# Patient Record
Sex: Female | Born: 1965 | Race: Black or African American | Hispanic: No | Marital: Married | Smoking: Never smoker
Health system: Southern US, Community
[De-identification: ages and names within clinical notes are randomized; demographics above are authoritative.]

## PROBLEM LIST (undated history)

## (undated) DIAGNOSIS — R51 Headache: Secondary | ICD-10-CM

## (undated) DIAGNOSIS — N920 Excessive and frequent menstruation with regular cycle: Secondary | ICD-10-CM

## (undated) DIAGNOSIS — E785 Hyperlipidemia, unspecified: Secondary | ICD-10-CM

## (undated) DIAGNOSIS — D500 Iron deficiency anemia secondary to blood loss (chronic): Secondary | ICD-10-CM

## (undated) DIAGNOSIS — I10 Essential (primary) hypertension: Secondary | ICD-10-CM

## (undated) DIAGNOSIS — D219 Benign neoplasm of connective and other soft tissue, unspecified: Secondary | ICD-10-CM

## (undated) DIAGNOSIS — T7840XA Allergy, unspecified, initial encounter: Secondary | ICD-10-CM

## (undated) HISTORY — DX: Essential (primary) hypertension: I10

## (undated) HISTORY — PX: WISDOM TOOTH EXTRACTION: SHX21

## (undated) HISTORY — DX: Hyperlipidemia, unspecified: E78.5

## (undated) HISTORY — DX: Allergy, unspecified, initial encounter: T78.40XA

## (undated) HISTORY — DX: Iron deficiency anemia secondary to blood loss (chronic): D50.0

---

## 2011-02-04 ENCOUNTER — Ambulatory Visit: Payer: Self-pay

## 2011-02-04 DIAGNOSIS — J0100 Acute maxillary sinusitis, unspecified: Secondary | ICD-10-CM

## 2012-04-14 LAB — HM PAP SMEAR: HM Pap smear: NEGATIVE

## 2012-04-28 ENCOUNTER — Telehealth: Payer: Self-pay | Admitting: *Deleted

## 2012-04-28 ENCOUNTER — Other Ambulatory Visit: Payer: Self-pay | Admitting: Nurse Practitioner

## 2012-04-28 DIAGNOSIS — E559 Vitamin D deficiency, unspecified: Secondary | ICD-10-CM

## 2012-04-28 NOTE — Telephone Encounter (Signed)
Informed patient of lab results and starting Vitamin D per protocol

## 2012-04-29 ENCOUNTER — Ambulatory Visit (INDEPENDENT_AMBULATORY_CARE_PROVIDER_SITE_OTHER): Payer: BC Managed Care – PPO | Admitting: Obstetrics & Gynecology

## 2012-04-29 ENCOUNTER — Other Ambulatory Visit: Payer: Self-pay | Admitting: Obstetrics & Gynecology

## 2012-04-29 ENCOUNTER — Encounter: Payer: Self-pay | Admitting: Obstetrics & Gynecology

## 2012-04-29 ENCOUNTER — Other Ambulatory Visit: Payer: Self-pay | Admitting: *Deleted

## 2012-04-29 ENCOUNTER — Ambulatory Visit (INDEPENDENT_AMBULATORY_CARE_PROVIDER_SITE_OTHER): Payer: BC Managed Care – PPO

## 2012-04-29 ENCOUNTER — Ambulatory Visit: Payer: Self-pay

## 2012-04-29 DIAGNOSIS — N920 Excessive and frequent menstruation with regular cycle: Secondary | ICD-10-CM

## 2012-04-29 DIAGNOSIS — D500 Iron deficiency anemia secondary to blood loss (chronic): Secondary | ICD-10-CM | POA: Insufficient documentation

## 2012-04-29 DIAGNOSIS — D251 Intramural leiomyoma of uterus: Secondary | ICD-10-CM

## 2012-04-29 DIAGNOSIS — I10 Essential (primary) hypertension: Secondary | ICD-10-CM | POA: Insufficient documentation

## 2012-04-29 DIAGNOSIS — D259 Leiomyoma of uterus, unspecified: Secondary | ICD-10-CM

## 2012-04-29 DIAGNOSIS — D250 Submucous leiomyoma of uterus: Secondary | ICD-10-CM

## 2012-04-29 DIAGNOSIS — N840 Polyp of corpus uteri: Secondary | ICD-10-CM

## 2012-04-29 NOTE — Progress Notes (Addendum)
47 y.o. MarriedAfrican American female   G1P1 for discussion of pelvic ultrasound done for uterine leiomyomata/fibroids, menorrhagia and anemia.  A sonohysterogramwas included. An endometrial biopsy was done  Patient's last menstrual period was 04/23/2012.          Sexually active: yes , none.     Technique: Both transabdominal and transvaginal ultrasound  examinations of the pelvis were performed. Transabdominal technique  was performed for global imaging of the pelvis including uterus,  ovaries, adnexal regions, and pelvic cul-de-sac.  It was necessary to proceed with endovaginal exam following the abdominal ultrasound transabdominal exam to visualize the endometrium and adnexa.  Color and duplex Doppler ultrasound was utilized to evaluate blood  flow to the ovaries.   Transabdominal and transvaginal ultrasound findings: Uterus: 9.4 x 6.7 x 6.6cm with multiple intramural fibroids 1.6cm, 1.6cm, 2.0cm, 1.9cm, 1.4cm, 1.0cm Endometrium: difficult to fully evaluate due to presence of probable 2.1 x 1.2cm fibroid Adnexa: Left: 3.8 x 2.3 x 2.7cm             Right:  3.5 x 2.7 x 2.3 cm Cul de sac:  No free fluid   SHG:  Speculum placed.  Cervix cleansed with Betadine.  Tenaculum placed on anterior lip of cervix.  IUI catheter passed through cervical os and sterile saline instilled.  1.1cm endometrial polyp and 2.1cm submucosal fibroid noted.  Catheter removed.  Patient tolerated procedure well.  Endometrial biopsy:  Speculum placed again.  Cervix cleansed again with Betadine.  Tenaculum reapplied to anterior lip of cervix.  Endometrial pipelle passed to fundus of uterus, 8.5cm, with good tissue sample obtained with one passed.  All instruments removed.  Patient tolerated procedure well.  Discussed with patient results.  She and I reviewed all images.  We discussed treatment options including ocp's (patient not interested because of hypertension issues), oral progesterone, Depo-Provera, Mirena  IUD, resection of fibroid hysteroscopically, endometrial ablation and hysterectomy.  She is not interested in hysterectomy.  We discussed risks, benefits, recovery from surgery.  She will discuss with her husband and await pathology results.     Assessment: Menorrhagia, Uterine fibroids including submucosal fibroid, endometria polyp, anemia  Plan: Await pathology results.  Patient leaning towards hysteroscopic resection of fibroid  30 min spent with patient >50% of this time was in face to face counseling

## 2012-05-12 ENCOUNTER — Telehealth: Payer: Self-pay | Admitting: *Deleted

## 2012-05-12 NOTE — Telephone Encounter (Signed)
Calling patient to check on desired dates for surgery.

## 2012-05-22 ENCOUNTER — Telehealth: Payer: Self-pay | Admitting: *Deleted

## 2012-05-22 NOTE — Telephone Encounter (Signed)
Pre op appointment needed?

## 2012-05-22 NOTE — Telephone Encounter (Signed)
2nd call to patient leaving message that I am checking on date preferences for procedure. LMTCB

## 2012-05-22 NOTE — Telephone Encounter (Signed)
Spoke with patient regarding dates and scheduled Hysteroscopy/Fibroid resection  for 06-08-12 and preop instructions given and mailed to patient.

## 2012-05-23 NOTE — Telephone Encounter (Signed)
Yes.  Pre op visit please.

## 2012-05-25 NOTE — Telephone Encounter (Signed)
Call to patient and preop scheduled for 05-27-12.

## 2012-05-27 ENCOUNTER — Ambulatory Visit (INDEPENDENT_AMBULATORY_CARE_PROVIDER_SITE_OTHER): Payer: BC Managed Care – PPO | Admitting: Obstetrics & Gynecology

## 2012-05-27 VITALS — BP 128/82 | HR 76 | Resp 16 | Wt 227.0 lb

## 2012-05-27 DIAGNOSIS — N920 Excessive and frequent menstruation with regular cycle: Secondary | ICD-10-CM

## 2012-05-27 DIAGNOSIS — D259 Leiomyoma of uterus, unspecified: Secondary | ICD-10-CM

## 2012-05-27 DIAGNOSIS — D500 Iron deficiency anemia secondary to blood loss (chronic): Secondary | ICD-10-CM

## 2012-05-27 MED ORDER — HYDROCODONE-IBUPROFEN 5-200 MG PO TABS: 2.0000 | ORAL_TABLET | Freq: Four times a day (QID) | ORAL | Status: DC | PRN

## 2012-05-29 ENCOUNTER — Encounter: Payer: Self-pay | Admitting: Obstetrics & Gynecology

## 2012-05-29 ENCOUNTER — Encounter (HOSPITAL_COMMUNITY): Payer: Self-pay | Admitting: Obstetrics & Gynecology

## 2012-05-29 NOTE — Progress Notes (Signed)
47 y.o. MarriedAfrican American female her with spouse to discuss procedure options for treatment of menorrhagia and an anemia due to fibroids found on SHG at last visit.  They have discussed and she is interested in proceeding with hysteroscopic fibroid resection.  They have many questions.  Findings at time of ultrasound discussed with pt and spouse.  Procedure, hospital stay, recovery, risks all discussed.  Specifically <1% risk of transfusion, bleeding, infection, failure of procedure, and need to proceed with additional surgery like a hysterectomy, bowel/bladder/ureteral injury, DVT/PE, 1% uterine perforation risk with possible fluid loss and electrolyte imbalance leading to cardiac arrhythmia as well as cerebral edema all discussed.  She really wants to maintain her uterus if at all possible.    Ultrasound findings from prior visit: Uterus: 9.4 x 6.7 x 6.6cm with multiple intramural fibroids 1.6cm, 1.6cm, 2.0cm, 1.9cm, 1.4cm, 1.0cm Endometrium: difficult to fully evaluate due to presence of probable 2.1 x 1.2cm fibroid Adnexa: Left: 3.8 x 2.3 x 2.7cm             Right:  3.5 x 2.7 x 2.3 cm Cul de sac:  No free fluid  SHG:  1.1cm endometrial polyp and 2.1cm submucosal fibroid noted.   Endometrial biopsy results:  Fragments of benign endocervical cells.  (No endometrial tissue present.)  Presurgical instructions given.  No physical exam performed today.  Assessment: Menorrhagia, Uterine fibroids including submucosal fibroid, endometria polyp, anemia  Plan: Hysteroscopic resection of fibroid and possible polyp planned. RX for VIcoprofen 5/200 given for post-op pain management.  20 min spent with patient >50% of this time was in face to face counseling

## 2012-05-29 NOTE — Patient Instructions (Signed)
Our office will call if there are any changes to the time of your surgery.

## 2012-06-04 ENCOUNTER — Encounter (HOSPITAL_COMMUNITY): Payer: Self-pay | Admitting: Pharmacist

## 2012-06-08 ENCOUNTER — Encounter (HOSPITAL_COMMUNITY)
Admission: RE | Disposition: A | Discharge status: Home / Self Care | Payer: Self-pay | Source: Ambulatory Visit | Attending: Obstetrics & Gynecology

## 2012-06-08 ENCOUNTER — Ambulatory Visit (HOSPITAL_COMMUNITY)
Admission: RE | Admit: 2012-06-08 | Discharge: 2012-06-08 | Disposition: A | Discharge status: Home / Self Care | Payer: BC Managed Care – PPO | Source: Ambulatory Visit | Attending: Obstetrics & Gynecology | Admitting: Obstetrics & Gynecology

## 2012-06-08 ENCOUNTER — Encounter (HOSPITAL_COMMUNITY): Payer: Self-pay | Admitting: *Deleted

## 2012-06-08 ENCOUNTER — Ambulatory Visit (HOSPITAL_COMMUNITY): Payer: BC Managed Care – PPO | Admitting: Anesthesiology

## 2012-06-08 ENCOUNTER — Encounter (HOSPITAL_COMMUNITY): Payer: Self-pay | Admitting: Anesthesiology

## 2012-06-08 DIAGNOSIS — N920 Excessive and frequent menstruation with regular cycle: Secondary | ICD-10-CM

## 2012-06-08 DIAGNOSIS — D250 Submucous leiomyoma of uterus: Secondary | ICD-10-CM | POA: Diagnosis present

## 2012-06-08 DIAGNOSIS — D500 Iron deficiency anemia secondary to blood loss (chronic): Secondary | ICD-10-CM | POA: Insufficient documentation

## 2012-06-08 DIAGNOSIS — N840 Polyp of corpus uteri: Secondary | ICD-10-CM | POA: Insufficient documentation

## 2012-06-08 DIAGNOSIS — I10 Essential (primary) hypertension: Secondary | ICD-10-CM | POA: Insufficient documentation

## 2012-06-08 DIAGNOSIS — D251 Intramural leiomyoma of uterus: Secondary | ICD-10-CM | POA: Insufficient documentation

## 2012-06-08 HISTORY — DX: Benign neoplasm of connective and other soft tissue, unspecified: D21.9

## 2012-06-08 HISTORY — DX: Excessive and frequent menstruation with regular cycle: N92.0

## 2012-06-08 HISTORY — PX: DILITATION & CURRETTAGE/HYSTROSCOPY WITH VERSAPOINT RESECTION: SHX5571

## 2012-06-08 LAB — CBC
Hemoglobin: 10.1 g/dL — ABNORMAL LOW (ref 12.0–15.0)
Platelets: 299 10*3/uL (ref 150–400)
RBC: 4.1 MIL/uL (ref 3.87–5.11)
WBC: 5.3 10*3/uL (ref 4.0–10.5)

## 2012-06-08 LAB — HCG, SERUM, QUALITATIVE: Preg, Serum: NEGATIVE

## 2012-06-08 SURGERY — DILATATION & CURETTAGE/HYSTEROSCOPY WITH VERSAPOINT RESECTION
Anesthesia: General | Site: Vagina | Wound class: Clean Contaminated

## 2012-06-08 MED ORDER — MIDAZOLAM HCL 2 MG/2ML IJ SOLN: 0.5000 mg | Freq: Once | INTRAMUSCULAR | Status: DC | PRN

## 2012-06-08 MED ORDER — FENTANYL CITRATE 0.05 MG/ML IJ SOLN
INTRAMUSCULAR | Status: AC
  Administered 2012-06-08: 50 ug via INTRAVENOUS
  Filled 2012-06-08: qty 2

## 2012-06-08 MED ORDER — LIDOCAINE HCL (CARDIAC) 20 MG/ML IV SOLN
INTRAVENOUS | Status: AC
  Filled 2012-06-08: qty 5

## 2012-06-08 MED ORDER — GLYCOPYRROLATE 0.2 MG/ML IJ SOLN
INTRAMUSCULAR | Status: DC | PRN
  Administered 2012-06-08: 0.2 mg via INTRAVENOUS

## 2012-06-08 MED ORDER — FENTANYL CITRATE 0.05 MG/ML IJ SOLN
INTRAMUSCULAR | Status: AC
  Filled 2012-06-08: qty 2

## 2012-06-08 MED ORDER — MEPERIDINE HCL 25 MG/ML IJ SOLN: 6.2500 mg | INTRAMUSCULAR | Status: DC | PRN

## 2012-06-08 MED ORDER — KETOROLAC TROMETHAMINE 30 MG/ML IJ SOLN
INTRAMUSCULAR | Status: AC
  Filled 2012-06-08: qty 1

## 2012-06-08 MED ORDER — MIDAZOLAM HCL 2 MG/2ML IJ SOLN
INTRAMUSCULAR | Status: AC
  Filled 2012-06-08: qty 2

## 2012-06-08 MED ORDER — CEFAZOLIN SODIUM-DEXTROSE 2-3 GM-% IV SOLR
INTRAVENOUS | Status: AC
  Filled 2012-06-08: qty 50

## 2012-06-08 MED ORDER — KETOROLAC TROMETHAMINE 30 MG/ML IJ SOLN
INTRAMUSCULAR | Status: DC | PRN
  Administered 2012-06-08: 30 mg via INTRAVENOUS

## 2012-06-08 MED ORDER — SILVER NITRATE-POT NITRATE 75-25 % EX MISC
CUTANEOUS | Status: DC | PRN
  Administered 2012-06-08: 10

## 2012-06-08 MED ORDER — SILVER NITRATE-POT NITRATE 75-25 % EX MISC
CUTANEOUS | Status: AC
  Filled 2012-06-08: qty 1

## 2012-06-08 MED ORDER — FENTANYL CITRATE 0.05 MG/ML IJ SOLN
INTRAMUSCULAR | Status: DC | PRN
  Administered 2012-06-08 (×2): 50 ug via INTRAVENOUS

## 2012-06-08 MED ORDER — CEFAZOLIN SODIUM-DEXTROSE 2-3 GM-% IV SOLR
2.0000 g | INTRAVENOUS | Status: AC
  Administered 2012-06-08: 2 g via INTRAVENOUS

## 2012-06-08 MED ORDER — ONDANSETRON HCL 4 MG/2ML IJ SOLN
INTRAMUSCULAR | Status: AC
  Filled 2012-06-08: qty 2

## 2012-06-08 MED ORDER — MIDAZOLAM HCL 5 MG/5ML IJ SOLN
INTRAMUSCULAR | Status: DC | PRN
  Administered 2012-06-08: 2 mg via INTRAVENOUS

## 2012-06-08 MED ORDER — HYDROCODONE-ACETAMINOPHEN 5-325 MG PO TABS: 1.0000 | ORAL_TABLET | Freq: Four times a day (QID) | ORAL | Status: DC | PRN

## 2012-06-08 MED ORDER — PROPOFOL 10 MG/ML IV EMUL
INTRAVENOUS | Status: DC | PRN
  Administered 2012-06-08: 200 mg via INTRAVENOUS

## 2012-06-08 MED ORDER — FENTANYL CITRATE 0.05 MG/ML IJ SOLN: 25.0000 ug | INTRAMUSCULAR | Status: DC | PRN

## 2012-06-08 MED ORDER — KETOROLAC TROMETHAMINE 30 MG/ML IJ SOLN: 15.0000 mg | Freq: Once | INTRAMUSCULAR | Status: DC | PRN

## 2012-06-08 MED ORDER — ALBUTEROL SULFATE HFA 108 (90 BASE) MCG/ACT IN AERS
INHALATION_SPRAY | RESPIRATORY_TRACT | Status: AC
  Filled 2012-06-08: qty 6.7

## 2012-06-08 MED ORDER — PROMETHAZINE HCL 25 MG/ML IJ SOLN: 6.2500 mg | INTRAMUSCULAR | Status: DC | PRN

## 2012-06-08 MED ORDER — SODIUM CHLORIDE 0.9 % IR SOLN
Status: DC | PRN
  Administered 2012-06-08 (×2): 3000 mL

## 2012-06-08 MED ORDER — VASOPRESSIN 20 UNIT/ML IJ SOLN
INTRAVENOUS | Status: DC | PRN
  Administered 2012-06-08: 10:00:00 via INTRAMUSCULAR

## 2012-06-08 MED ORDER — LIDOCAINE HCL (CARDIAC) 20 MG/ML IV SOLN
INTRAVENOUS | Status: DC | PRN
  Administered 2012-06-08: 50 mg via INTRAVENOUS

## 2012-06-08 MED ORDER — ONDANSETRON HCL 4 MG/2ML IJ SOLN
INTRAMUSCULAR | Status: DC | PRN
  Administered 2012-06-08: 4 mg via INTRAVENOUS

## 2012-06-08 MED ORDER — VASOPRESSIN 20 UNIT/ML IJ SOLN
INTRAMUSCULAR | Status: AC
  Filled 2012-06-08: qty 1

## 2012-06-08 MED ORDER — HYDROCODONE-IBUPROFEN 5-200 MG PO TABS: 2.0000 | ORAL_TABLET | Freq: Four times a day (QID) | ORAL | Status: DC | PRN

## 2012-06-08 MED ORDER — PROPOFOL 10 MG/ML IV EMUL
INTRAVENOUS | Status: AC
  Filled 2012-06-08: qty 20

## 2012-06-08 MED ORDER — LACTATED RINGERS IV SOLN
INTRAVENOUS | Status: DC
  Administered 2012-06-08 (×2): via INTRAVENOUS

## 2012-06-08 SURGICAL SUPPLY — 20 items
CANISTER SUCTION 2500CC (MISCELLANEOUS) ×2 IMPLANT
CATH ROBINSON RED A/P 16FR (CATHETERS) ×2 IMPLANT
CLOTH BEACON ORANGE TIMEOUT ST (SAFETY) ×2 IMPLANT
CONTAINER PREFILL 10% NBF 60ML (FORM) ×4 IMPLANT
DILATOR CANAL MILEX (MISCELLANEOUS) IMPLANT
DRESSING TELFA 8X3 (GAUZE/BANDAGES/DRESSINGS) ×2 IMPLANT
ELECT REM PT RETURN 9FT ADLT (ELECTROSURGICAL)
ELECTRODE REM PT RTRN 9FT ADLT (ELECTROSURGICAL) IMPLANT
ELECTRODE ROLLER VERSAPOINT (ELECTRODE) IMPLANT
ELECTRODE RT ANGLE VERSAPOINT (CUTTING LOOP) ×2 IMPLANT
GLOVE BIOGEL PI IND STRL 7.0 (GLOVE) ×1 IMPLANT
GLOVE BIOGEL PI INDICATOR 7.0 (GLOVE) ×1
GLOVE ECLIPSE 6.5 STRL STRAW (GLOVE) ×4 IMPLANT
GOWN STRL REIN XL XLG (GOWN DISPOSABLE) ×4 IMPLANT
LOOP ANGLED CUTTING 22FR (CUTTING LOOP) IMPLANT
PACK HYSTEROSCOPY LF (CUSTOM PROCEDURE TRAY) ×2 IMPLANT
PAD OB MATERNITY 4.3X12.25 (PERSONAL CARE ITEMS) ×2 IMPLANT
SOLUTION ANTI FOG 6CC (MISCELLANEOUS) ×2 IMPLANT
TOWEL OR 17X24 6PK STRL BLUE (TOWEL DISPOSABLE) ×4 IMPLANT
WATER STERILE IRR 1000ML POUR (IV SOLUTION) ×2 IMPLANT

## 2012-06-08 NOTE — Transfer of Care (Signed)
Immediate Anesthesia Transfer of Care Note  Patient: Belinda Petersen  Procedure(s) Performed: Procedure(s): DILATATION & CURETTAGE/HYSTEROSCOPY WITH VERSAPOINT RESECTION (N/A)  Patient Location: PACU  Anesthesia Type:General  Level of Consciousness: awake, alert  and oriented  Airway & Oxygen Therapy: Patient Spontanous Breathing and Patient connected to nasal cannula oxygen  Post-op Assessment: Report given to PACU RN and Post -op Vital signs reviewed and stable  Post vital signs: Reviewed and stable  Complications: No apparent anesthesia complications

## 2012-06-08 NOTE — H&P (Addendum)
Belinda Petersen is an 47 y.o. female with hx of menorrhagia and an anemia due to fibroids found on SHG.  [facility] showed 2.1cm submucosal fibroid and endometrial polyp.  Endometrial biopsy had no intact endometrial tissue.  She is here for hysteroscopic resection of fibroid and polyp.  Risks and benefits discussed at last visit. All questions answered.  No new issues since last visit.   Ultrasound findings from prior visit:  Uterus: 9.4 x 6.7 x 6.6cm with multiple intramural fibroids 1.6cm, 1.6cm, 2.0cm, 1.9cm, 1.4cm, 1.0cm  Endometrium: difficult to fully evaluate due to presence of probable 2.1 x 1.2cm fibroid  Adnexa: Left: 3.8 x 2.3 x 2.7cm  Right: 3.5 x 2.7 x 2.3 cm  Cul de sac: No free fluid  SHG: 1.1cm endometrial polyp and 2.1cm submucosal fibroid noted.  Endometrial biopsy results: Fragments of benign endocervical cells. (No endometrial tissue present.)    Pertinent Gynecological History: Menses: menorrhagia Bleeding: heavy Contraception: condoms DES exposure: denies Blood transfusions: none Sexually transmitted diseases: no past history Previous GYN Procedures: none  Last mammogram: normal Date: 2012 Last pap: normal Date: 2014 OB History: G1, P1   Menstrual History: No LMP recorded.  Per patient, LMP was 05/22/12    Past Medical History  Diagnosis Date  . Iron deficiency anemia secondary to blood loss (chronic)   . Menorrhagia   . Fibroids   . SVD (spontaneous vaginal delivery)     x 1  . HTN (hypertension)     no meds    Past Surgical History  Procedure Laterality Date  . Wisdom tooth extraction      Family History  Problem Relation Age of Onset  . Diabetes Father   . Hypertension Mother     deceased  . Hypertension Brother   . Hypertension Brother   . Hypertension Brother   . Hypertension Sister   . Heart disease Mother     deceased  . Heart disease Brother     Social History:  reports that she has never smoked. She has never used smokeless tobacco.  She reports that  drinks alcohol. She reports that she does not use illicit drugs.  Allergies: No Known Allergies  Prescriptions prior to admission  Medication Sig Dispense Refill  . ferrous fumarate (HEMOCYTE - 106 MG FE) 325 (106 FE) MG TABS Take 1 tablet by mouth daily.      . hydrocodone-ibuprofen (VICOPROFEN) 5-200 MG per tablet Take 2 tablets by mouth every 6 (six) hours as needed for pain.  20 tablet  0    Review of Systems  Constitutional: Negative for fever and chills.  Respiratory: Negative for cough.   Cardiovascular: Negative for chest pain and palpitations.  Gastrointestinal: Negative for heartburn and nausea.  Genitourinary: Negative for dysuria.  Musculoskeletal: Negative for myalgias.  Neurological: Negative for dizziness, tingling and headaches.  Psychiatric/Behavioral: Negative for depression.    Blood pressure 152/102, pulse 79, temperature 98.2 F (36.8 C), temperature source Oral, resp. rate 18, height 5\' 3"  (1.6 m), weight 227 lb (102.967 kg), SpO2 100.00%. Physical Exam  Constitutional: She is oriented to person, place, and time. She appears well-developed and well-nourished.  HENT:  Head: Normocephalic and atraumatic.  Neck: Normal range of motion. Neck supple.  Cardiovascular: Normal rate and regular rhythm.   Respiratory: Effort normal and breath sounds normal.  GI: Soft. Bowel sounds are normal.  Neurological: She is alert and oriented to person, place, and time.  Skin: Skin is warm and dry.    Results  for orders placed during the hospital encounter of 06/08/12 (from the past 24 hour(s))  HCG, SERUM, QUALITATIVE     Status: None   Collection Time    06/08/12  8:25 AM      Result Value Range   Preg, Serum NEGATIVE  NEGATIVE  CBC     Status: Abnormal   Collection Time    06/08/12  8:25 AM      Result Value Range   WBC 5.3  4.0 - 10.5 K/uL   RBC 4.10  3.87 - 5.11 MIL/uL   Hemoglobin 10.1 (*) 12.0 - 15.0 g/dL   HCT 16.1 (*) 09.6 - 04.5 %   MCV  76.3 (*) 78.0 - 100.0 fL   MCH 24.6 (*) 26.0 - 34.0 pg   MCHC 32.3  30.0 - 36.0 g/dL   RDW 40.9 (*) 81.1 - 91.4 %   Platelets 299  150 - 400 K/uL    No results found.  Assessment/Plan: 47 year old with menorrhagia, submucosal fibroid and polyp here for hysteroscopic resection.  All questions answered.  Patient here and ready to proceed.  Valentina Shaggy [facility] 06/08/2012, 9:16 AM

## 2012-06-08 NOTE — Anesthesia Preprocedure Evaluation (Signed)
Anesthesia Evaluation  Patient identified by MRN, date of birth, ID band Patient awake    Reviewed: Allergy & Precautions, H&P , Patient's Chart, lab work & pertinent test results, reviewed documented beta blocker date and time   History of Anesthesia Complications Negative for: history of anesthetic complications  Airway Mallampati: III TM Distance: >3 FB Neck ROM: full    Dental no notable dental hx.    Pulmonary neg pulmonary ROS,  breath sounds clear to auscultation  Pulmonary exam normal       Cardiovascular Exercise Tolerance: Good hypertension, negative cardio ROS  Rhythm:regular Rate:Normal     Neuro/Psych negative neurological ROS  negative psych ROS   GI/Hepatic negative GI ROS, Neg liver ROS,   Endo/Other  negative endocrine ROSMorbid obesity  Renal/GU negative Renal ROS     Musculoskeletal   Abdominal   Peds  Hematology negative hematology ROS (+) anemia ,   Anesthesia Other Findings Iron deficiency anemia secondary to blood loss (chronic)     Menorrhagia        Fibroids     SVD (spontaneous vaginal delivery)   x 1    HTN (hypertension)   no meds    Reproductive/Obstetrics negative OB ROS                           Anesthesia Physical Anesthesia Plan  ASA: III  Anesthesia Plan: General LMA   Post-op Pain Management:    Induction:   Airway Management Planned:   Additional Equipment:   Intra-op Plan:   Post-operative Plan:   Informed Consent: I have reviewed the patients History and Physical, chart, labs and discussed the procedure including the risks, benefits and alternatives for the proposed anesthesia with the patient or authorized representative who has indicated his/her understanding and acceptance.   Dental Advisory Given  Plan Discussed with: CRNA, Surgeon and Anesthesiologist  Anesthesia Plan Comments:         Anesthesia Quick Evaluation

## 2012-06-08 NOTE — Anesthesia Postprocedure Evaluation (Signed)
  Anesthesia Post Note  Patient: Belinda Petersen  Procedure(s) Performed: Procedure(s) (LRB): DILATATION & CURETTAGE/HYSTEROSCOPY WITH VERSAPOINT RESECTION (N/A)  Anesthesia type: GA  Patient location: PACU  Post pain: Pain level controlled  Post assessment: Post-op Vital signs reviewed  Last Vitals:  Filed Vitals:   06/08/12 1130  BP: 142/91  Pulse: 82  Temp:   Resp: 15    Post vital signs: Reviewed  Level of consciousness: sedated  Complications: No apparent anesthesia complications

## 2012-06-08 NOTE — Op Note (Signed)
06/08/2012  11:24 AM  PATIENT:  Belinda Petersen  47 y.o. female G1P1 with menorrhagia, 2.1cm submucosal fibroid, other intramural fibroids <2.0 cm, anemia  PRE-OPERATIVE DIAGNOSIS:  menorrhagia, fibroid anemia  POST-OPERATIVE DIAGNOSIS:  menorrhagia, fibroid anemia  PROCEDURE:  Procedure(s): DILATATION & CURETTAGE/HYSTEROSCOPY WITH VERSAPOINT RESECTION OF SUBMUCOSAL FIBROID  SURGEON:  Victoriano Campion SUZANNE  ASSISTANTS: OR STAFF   ANESTHESIA:   general  ESTIMATED BLOOD LOSS: 50cc  BLOOD ADMINISTERED:none   FLUIDS: 1600cc LR  UOP: 75 cc drained with I&O cath at beginning of surgery  SPECIMEN:  Submucosal fibroid and possible polyp   DISPOSITION OF SPECIMEN:  PATHOLOGY  FINDINGS: Fluffy endometrium with possible polyp as well as submucosal fibroid filling substantial portion of endometrial cavity.  Posterior attechment to uterine wall.  DESCRIPTION OF OPERATION: Patient was taken to the operating room. She is placed in the supine position. General anesthesia was administered by the anesthesia staff without difficulty. She did have a running IV in place. SCDs were on lower legs and functioning properly.  Informed consent was present on the chart.  Legs are placed in the [facility] stirrups in the low lithotomy position. Perineum inner thighs and vagina were prepped with Betadine x3 and she's draped in a normal standard fashion. A red rubber Foley catheter is used to drain the bladder of all urine. A bivalve speculums placed the vagina. The anterior lip of the cervix was grasped with a single-tooth tenaculum. A paracervical placement of Pitressin (20 units in 100 cc fluid) was used. Then the cc total was injected around the cervix at the cervicovaginal junction. The cervix was then dilated with Shawnie Pons dilators up to #29. An attempt was made to pass the operative hysteroscope but this was unsuccessful. Therefore the cervix is dilated up to #31. Then the hysteroscope with the angled VersaPoint  resection loop attached was passed through the cervical os and into the endometrial cavity. The fundus was visualized as well as the tubal ostia on the left. The fibroid obscured seeing the tubal ostia on the right. The fibroid and a posterior attachment until the special portion of the endometrial cavity. With the VersaPoint on normal settings the fibroid was fully resected by extending the resection loop and bringing it back towards hysteroscope starting at the superficial portion of the submucosal fibroid and continuing until fibroid appeared fully resected and the posterior wall of the uterus was flat in appearance. Hysteroscope was removed approximately 3 times during the procedure to try and remove fibroid resection pieces for better visualization.   Photodocumentation was before and after resection was completed.  When the resection was complete the fluid was allowed to slowly and the from the endometrial cavity so I could watch for any bleeders.  Number present. The hysteroscope was fully removed.  Normal saline was used as a hysteroscopic fluid for this procedure. The deficit was 540 cc. This included fluid on the floor which soaked two blue towels as well as fluid that had soaked the towels used as part the drape.  Patient tolerated procedure very well. The Betadine prep was cleansed of her skin. Legs are placed back in the supine position. 30 mg IV of Toradol was given. Sponge, lap, needle, initially counts were correct x2. Patient was awakened from anesthesia and taken to recovery stable condition.   COUNTS:  YES  PLAN OF CARE: Transfer to PACU

## 2012-06-08 NOTE — Progress Notes (Signed)
Notified Dr Brayton Caves of patient's BP 152/102 this morning in Short Stay.  No orders given.

## 2012-06-09 ENCOUNTER — Encounter (HOSPITAL_COMMUNITY): Payer: Self-pay | Admitting: Obstetrics & Gynecology

## 2012-06-15 ENCOUNTER — Telehealth: Payer: Self-pay | Admitting: Obstetrics & Gynecology

## 2012-06-15 NOTE — Telephone Encounter (Signed)
Patient left message on answering machine 06/13/12 at 1:49 AM re: had procedure last Monday and reports heavy bleeding and pain. Please advise.

## 2012-06-15 NOTE — Telephone Encounter (Signed)
LMTCB  aa 

## 2012-06-15 NOTE — Telephone Encounter (Signed)
Called patient home and cell # left message of need to call office for appt.  Called patient spouse cell # and left message of need to call office.

## 2012-06-16 ENCOUNTER — Telehealth: Payer: Self-pay | Admitting: *Deleted

## 2012-06-16 NOTE — Telephone Encounter (Signed)
May chart be filed back. See phone note for 06/15/2012. ? sue

## 2012-06-16 NOTE — Telephone Encounter (Signed)
Yes.  Close encounter.

## 2012-06-24 ENCOUNTER — Telehealth: Payer: Self-pay | Admitting: *Deleted

## 2012-06-24 NOTE — Telephone Encounter (Signed)
Patient called at 330 to report that she had bright red vaginal bleeding this am that covered (but not soaked) a minipad.  She reports she had intercourse on 06-22-12 for the first time since procedure.  Bleeding is now mostly with wiping but is still bright red and has some mild cramps.  After reviewed with Dr Hyacinth Meeker, called patient back at 4pm to let her know that Dr Hyacinth Meeker would like her to take it easy, monitor, observe, may take Motrin for cramps and call for heavy bleeding or increased pain.  Patient agreeable.

## 2012-07-02 ENCOUNTER — Encounter: Payer: Self-pay | Admitting: Obstetrics & Gynecology

## 2012-07-02 ENCOUNTER — Ambulatory Visit (INDEPENDENT_AMBULATORY_CARE_PROVIDER_SITE_OTHER): Payer: BC Managed Care – PPO | Admitting: Obstetrics & Gynecology

## 2012-07-02 VITALS — BP 160/100 | HR 80 | Resp 16

## 2012-07-02 DIAGNOSIS — N920 Excessive and frequent menstruation with regular cycle: Secondary | ICD-10-CM

## 2012-07-02 NOTE — Patient Instructions (Signed)
Return in two months for CBC, iron studies.

## 2012-07-02 NOTE — Progress Notes (Signed)
47 y.o. G1P1 MarriedAfrican AmericanF here for post op visit after hystroscopic fibtroid resection.  Has had bleeding with intercourse once since surgery.  Husband is worried about this.  Patient had bleeding for several days after procedure.  Feeling well.  No fever.  No bowel/bladder changes.  No vaginal discharge.  Patient's last menstrual period was 05/22/2012.           Exam:   BP 160/100  Pulse 80  Resp 16  LMP 05/22/2012  General appearance: alert, cooperative and appears stated age  Pelvic: External genitalia:  no lesions              Urethra:  normal appearing urethra with no masses, tenderness or lesions              Bartholins and Skenes: normal                 Vagina: normal appearing vagina with normal color and discharge, no lesions              Cervix: no lesions and closed Bimanual Exam:  Uterus:  enlarged, 10-12 weeks size, globular, nontender       Hb today 12.1       A:  S/p hysteroscopic fibroid resection Needs to f/u with PCP about BP  P:   Return for CBC, iron studies two months.   An After Visit Summary was printed and given to the patient.

## 2012-07-03 ENCOUNTER — Telehealth: Payer: Self-pay

## 2012-07-03 LAB — HEMOGLOBIN, FINGERSTICK: Hemoglobin, fingerstick: 12.1 g/dL (ref 12.0–16.0)

## 2012-07-03 NOTE — Telephone Encounter (Signed)
5/23 lmtcb-need to inform patient should f/u with PCP re: elevated BP yesterday.

## 2012-07-10 NOTE — Telephone Encounter (Signed)
5/30 lmtcb/kn 

## 2012-07-13 NOTE — Telephone Encounter (Signed)
Spoke with patient. Advised needs to follow up with PCP re: elevated blood pressure. Is in the process of looking for one, will discuss when she finds one.

## 2012-07-14 ENCOUNTER — Telehealth: Payer: Self-pay | Admitting: *Deleted

## 2012-07-29 ENCOUNTER — Encounter: Payer: Self-pay | Admitting: Obstetrics & Gynecology

## 2012-07-29 ENCOUNTER — Other Ambulatory Visit: Payer: Self-pay

## 2012-08-25 ENCOUNTER — Other Ambulatory Visit: Payer: BC Managed Care – PPO

## 2012-08-31 ENCOUNTER — Other Ambulatory Visit: Payer: BC Managed Care – PPO

## 2012-09-01 ENCOUNTER — Ambulatory Visit (INDEPENDENT_AMBULATORY_CARE_PROVIDER_SITE_OTHER): Payer: BC Managed Care – PPO | Admitting: Obstetrics & Gynecology

## 2012-09-01 DIAGNOSIS — N920 Excessive and frequent menstruation with regular cycle: Secondary | ICD-10-CM

## 2012-09-01 DIAGNOSIS — E559 Vitamin D deficiency, unspecified: Secondary | ICD-10-CM

## 2012-09-01 LAB — CBC
HCT: 31.6 % — ABNORMAL LOW (ref 36.0–46.0)
Hemoglobin: 10.3 g/dL — ABNORMAL LOW (ref 12.0–15.0)
MCHC: 32.6 g/dL (ref 30.0–36.0)
RDW: 16.8 % — ABNORMAL HIGH (ref 11.5–15.5)
WBC: 4.9 10*3/uL (ref 4.0–10.5)

## 2012-09-01 LAB — FERRITIN: Ferritin: 16 ng/mL (ref 10–291)

## 2012-09-01 LAB — IRON: Iron: 42 ug/dL (ref 42–145)

## 2012-09-02 ENCOUNTER — Telehealth: Payer: Self-pay | Admitting: *Deleted

## 2012-09-02 LAB — VITAMIN D 25 HYDROXY (VIT D DEFICIENCY, FRACTURES): Vit D, 25-Hydroxy: 28 ng/mL — ABNORMAL LOW (ref 30–89)

## 2012-09-02 NOTE — Telephone Encounter (Signed)
Message copied by Regis Lax on Wed Sep 02, 2012 11:28 AM ------      Message from: Conley Simmonds      Created: Wed Sep 02, 2012  8:29 AM       Please contact patient regarding her low vitamin D and start the protocol with her.            Her lab studies indicate anemia and low normal iron levels.  Does she have this information at this time?      If she is not on any iron.  Please have her start FeS04 325 mg po bid with meals.  She may need Colace to soften her stools.      She will need a lab recheck in 1 - 2 months.      If she is having heavy or irregular bleeding, follow up with Dr. Hyacinth Meeker. ------

## 2012-09-02 NOTE — Telephone Encounter (Signed)
Left Message To Call Back  

## 2012-09-07 NOTE — Telephone Encounter (Signed)
Left Message To Call Back  

## 2012-09-10 ENCOUNTER — Encounter: Payer: Self-pay | Admitting: *Deleted

## 2012-09-10 MED ORDER — FERROUS FUMARATE 325 (106 FE) MG PO TABS: 1.0000 | ORAL_TABLET | Freq: Every day | ORAL | Status: DC

## 2012-09-10 NOTE — Telephone Encounter (Signed)
Left Message To Call Back  

## 2012-09-10 NOTE — Telephone Encounter (Signed)
Returning call.

## 2012-09-10 NOTE — Telephone Encounter (Signed)
Patient notified see labs 

## 2012-09-22 ENCOUNTER — Ambulatory Visit (INDEPENDENT_AMBULATORY_CARE_PROVIDER_SITE_OTHER): Payer: BC Managed Care – PPO | Admitting: Family Medicine

## 2012-09-22 VITALS — BP 178/100 | HR 79 | Temp 98.1°F | Resp 18 | Ht 64.0 in | Wt 228.0 lb

## 2012-09-22 DIAGNOSIS — R42 Dizziness and giddiness: Secondary | ICD-10-CM

## 2012-09-22 DIAGNOSIS — I10 Essential (primary) hypertension: Secondary | ICD-10-CM

## 2012-09-22 DIAGNOSIS — D649 Anemia, unspecified: Secondary | ICD-10-CM

## 2012-09-22 DIAGNOSIS — R51 Headache: Secondary | ICD-10-CM

## 2012-09-22 LAB — COMPREHENSIVE METABOLIC PANEL WITH GFR
ALT: 13 U/L (ref 0–35)
AST: 16 U/L (ref 0–37)
Alkaline Phosphatase: 72 U/L (ref 39–117)
CO2: 31 meq/L (ref 19–32)
Sodium: 137 meq/L (ref 135–145)
Total Bilirubin: 0.3 mg/dL (ref 0.3–1.2)
Total Protein: 7.5 g/dL (ref 6.0–8.3)

## 2012-09-22 LAB — COMPREHENSIVE METABOLIC PANEL
Albumin: 3.7 g/dL (ref 3.5–5.2)
BUN: 12 mg/dL (ref 6–23)
Calcium: 9.3 mg/dL (ref 8.4–10.5)
Chloride: 102 mEq/L (ref 96–112)
Creat: 0.84 mg/dL (ref 0.50–1.10)
Glucose, Bld: 75 mg/dL (ref 70–99)
Potassium: 4.3 mEq/L (ref 3.5–5.3)

## 2012-09-22 LAB — POCT CBC
Granulocyte percent: 52.5 %G (ref 37–80)
HCT, POC: 32.3 % — AB (ref 37.7–47.9)
Hemoglobin: 9.9 g/dL — AB (ref 12.2–16.2)
Lymph, poc: 1.7 (ref 0.6–3.4)
MCH, POC: 25.4 pg — AB (ref 27–31.2)
MCHC: 30.7 g/dL — AB (ref 31.8–35.4)
MCV: 82.8 fL (ref 80–97)
MID (cbc): 0.4 (ref 0–0.9)
MPV: 8.3 fL (ref 0–99.8)
POC Granulocyte: 2.4 (ref 2–6.9)
POC LYMPH PERCENT: 37.9 %L (ref 10–50)
POC MID %: 9.6 % (ref 0–12)
Platelet Count, POC: 294 10*3/uL (ref 142–424)
RBC: 3.9 M/uL — AB (ref 4.04–5.48)
RDW, POC: 16.5 %
WBC: 4.6 10*3/uL (ref 4.6–10.2)

## 2012-09-22 MED ORDER — CHLORTHALIDONE 25 MG PO TABS: 25.0000 mg | ORAL_TABLET | Freq: Every day | ORAL | Status: DC

## 2012-09-22 NOTE — Patient Instructions (Signed)

## 2012-09-22 NOTE — Progress Notes (Signed)
[facility]:  Office Visit  Chief Complaint:  Chief Complaint  Patient presents with  . Blood Pressure Check    elevated, headaches, dizziness x2 days     HPI: Belinda Petersen is a 47 y.o. female who complains of 2 day history of feeling left sided aching HA, dizziness, some light and noise sensivitivity, with left eye pain. All these sxs went away today after taking Midol. She is currently sxs fee, she has had dx  HTN in the past. She had been on HTN meds before in [place] but was taken off due to 20 lb weight loss. She denies CP/SOB/n/v/abd pain/palpitations. She has a h/o anemia. Was seen at ob gyn on 07/02/12 and was told she had elvated BP and needed to get her BP rechecked and needed to be seen by PCP. She has not seen her PCP because did not realize it was that high. Deneis CP, SOB, paresthesia, asymmetric weakness, slurred speech.   Past Medical History  Diagnosis Date  . Iron deficiency anemia secondary to blood loss (chronic)   . Menorrhagia   . Fibroids   . SVD (spontaneous vaginal delivery)     x 1  . HTN (hypertension)     no meds   Past Surgical History  Procedure Laterality Date  . Wisdom tooth extraction    . Dilitation & currettage/hystroscopy with versapoint resection N/A 06/08/2012    Procedure: DILATATION & CURETTAGE/HYSTEROSCOPY WITH VERSAPOINT RESECTION;  Surgeon: Annamaria Boots, MD;  Location: [facility];  Service: Gynecology;  Laterality: N/A;   History   Social History  . Marital Status: Married    Spouse Name: N/A    Number of Children: N/A  . Years of Education: N/A   Social History Main Topics  . Smoking status: Never Smoker   . Smokeless tobacco: Never Used  . Alcohol Use: Yes     Comment: socially  . Drug Use: No  . Sexually Active: Yes    Birth Control/ Protection: None   Other Topics Concern  . None   Social History Narrative  . None   Family History  Problem Relation Age of Onset  . Diabetes Father   .  Hypertension Mother     deceased  . Heart disease Mother     deceased  . Hypertension Brother   . Hypertension Brother   . Hypertension Brother   . Hypertension Sister   . Heart disease Brother    No Known Allergies Prior to Admission medications   Medication Sig Start Date End Date Taking? Authorizing Provider  ferrous fumarate (HEMOCYTE - 106 MG FE) 325 (106 FE) MG TABS Take 1 tablet (106 mg of iron total) by mouth daily. 09/10/12  Yes Annamaria Boots, MD  Vitamin D, Ergocalciferol, (DRISDOL) 50000 UNITS CAPS Take 50,000 Units by mouth every 7 (seven) days.   Yes Historical Provider, MD     ROS: The patient denies fevers, chills, night sweats, unintentional weight loss, chest pain, palpitations, wheezing, dyspnea on exertion, nausea, vomiting, abdominal pain, dysuria, hematuria, melena, numbness, weakness, or tingling.   All other systems have been reviewed and were otherwise negative with the exception of those mentioned in the HPI and as above.    PHYSICAL EXAM: Filed Vitals:   09/22/12 1416  BP: 178/100  Pulse: 79  Temp: 98.1 F (36.7 C)  Resp: 18   Filed Vitals:   09/22/12 1416  Height: 5\' 4"  (1.626 m)  Weight: 228 lb (103.42 kg)  Body mass index is 39.12 kg/(m^2).  General: Alert, no acute distress HEENT:  Normocephalic, atraumatic, oropharynx patent. EOMI, PERRLa, fundoscopic exam nl.  Cardiovascular:  Regular rate and rhythm, no rubs murmurs or gallops.  No Carotid bruits, radial pulse intact. No pedal edema.  Respiratory: Clear to auscultation bilaterally.  No wheezes, rales, or rhonchi.  No cyanosis, no use of accessory musculature GI: No organomegaly, abdomen is soft and non-tender, positive bowel sounds.  No masses. Skin: No rashes. Neurologic: Facial musculature symmetric. CN 2-12 grossly normal, no slurred speech.  Psychiatric: Patient is appropriate throughout our interaction. Lymphatic: No cervical lymphadenopathy Musculoskeletal: Gait  intact.   LABS: Results for orders placed in visit on 09/22/12  POCT CBC      Result Value Range   WBC 4.6  4.6 - 10.2 K/uL   Lymph, poc 1.7  0.6 - 3.4   POC LYMPH PERCENT 37.9  10 - 50 %L   MID (cbc) 0.4  0 - 0.9   POC MID % 9.6  0 - 12 %M   POC Granulocyte 2.4  2 - 6.9   Granulocyte percent 52.5  37 - 80 %G   RBC 3.90 (*) 4.04 - 5.48 M/uL   Hemoglobin 9.9 (*) 12.2 - 16.2 g/dL   HCT, POC 86.5 (*) 78.4 - 47.9 %   MCV 82.8  80 - 97 fL   MCH, POC 25.4 (*) 27 - 31.2 pg   MCHC 30.7 (*) 31.8 - 35.4 g/dL   RDW, POC 69.6     Platelet Count, POC 294  142 - 424 K/uL   MPV 8.3  0 - 99.8 fL     EKG/XRAY:   Primary read interpreted by Dr. Conley Rolls at [facility].   ASSESSMENT/PLAN: Encounter Diagnoses  Name Primary?  . Dizziness and giddiness Yes  . HTN (hypertension)   . Headache(784.0)   . Anemia, unspecified    F/u in 1 month recheck anemia and also HTN, she can do this with her PCP or with [place] She just needs to get it rechecked BP log recommended BP Goals d/w pt, < 140/90 Chlorthalidone 25 mg daily, 1 RF Gross sideeffects, risk and benefits, and alternatives of medications d/w patient. Patient is aware that all medications have potential sideeffects and we are unable to predict every sideeffect or drug-drug interaction that may occur. Go ro ER for wrosening or different sxs      Daequan Kozma PHUONG, DO 09/22/2012 4:50 PM

## 2012-10-02 NOTE — Progress Notes (Signed)
She should take 2000 IU OTC daily.  You can send a letter if you need.

## 2012-12-05 ENCOUNTER — Other Ambulatory Visit: Payer: Self-pay | Admitting: Family Medicine

## 2012-12-07 ENCOUNTER — Other Ambulatory Visit: Payer: Self-pay | Admitting: Family Medicine

## 2012-12-07 ENCOUNTER — Other Ambulatory Visit: Payer: Self-pay

## 2012-12-07 DIAGNOSIS — Z1231 Encounter for screening mammogram for malignant neoplasm of breast: Secondary | ICD-10-CM

## 2013-01-04 ENCOUNTER — Ambulatory Visit: Payer: BC Managed Care – PPO

## 2013-01-26 ENCOUNTER — Other Ambulatory Visit: Payer: Self-pay | Admitting: Physician Assistant

## 2013-03-01 ENCOUNTER — Telehealth: Payer: Self-pay

## 2013-03-01 NOTE — Telephone Encounter (Signed)
Left message for call back Non identifiable  

## 2013-03-03 ENCOUNTER — Ambulatory Visit: Payer: BC Managed Care – PPO | Admitting: Family Medicine

## 2013-03-03 DIAGNOSIS — Z0289 Encounter for other administrative examinations: Secondary | ICD-10-CM

## 2013-03-03 NOTE — Telephone Encounter (Signed)
Unable to reach prior to visit No show

## 2013-03-20 ENCOUNTER — Other Ambulatory Visit: Payer: Self-pay | Admitting: Family Medicine

## 2013-10-13 ENCOUNTER — Encounter (HOSPITAL_COMMUNITY): Payer: Self-pay | Admitting: Emergency Medicine

## 2013-10-13 ENCOUNTER — Observation Stay (HOSPITAL_COMMUNITY)
Admission: EM | Admit: 2013-10-13 | Discharge: 2013-10-15 | Disposition: A | Discharge status: Home / Self Care | Payer: BC Managed Care – PPO | Attending: Family Medicine | Admitting: Family Medicine

## 2013-10-13 DIAGNOSIS — Z79899 Other long term (current) drug therapy: Secondary | ICD-10-CM | POA: Insufficient documentation

## 2013-10-13 DIAGNOSIS — R0789 Other chest pain: Principal | ICD-10-CM | POA: Insufficient documentation

## 2013-10-13 DIAGNOSIS — Z8742 Personal history of other diseases of the female genital tract: Secondary | ICD-10-CM | POA: Insufficient documentation

## 2013-10-13 DIAGNOSIS — D500 Iron deficiency anemia secondary to blood loss (chronic): Secondary | ICD-10-CM | POA: Diagnosis present

## 2013-10-13 DIAGNOSIS — R079 Chest pain, unspecified: Secondary | ICD-10-CM | POA: Diagnosis present

## 2013-10-13 DIAGNOSIS — I160 Hypertensive urgency: Secondary | ICD-10-CM

## 2013-10-13 DIAGNOSIS — N920 Excessive and frequent menstruation with regular cycle: Secondary | ICD-10-CM

## 2013-10-13 DIAGNOSIS — I10 Essential (primary) hypertension: Secondary | ICD-10-CM | POA: Insufficient documentation

## 2013-10-13 DIAGNOSIS — D250 Submucous leiomyoma of uterus: Secondary | ICD-10-CM

## 2013-10-13 DIAGNOSIS — E669 Obesity, unspecified: Secondary | ICD-10-CM

## 2013-10-13 HISTORY — DX: Headache: R51

## 2013-10-13 LAB — CBC
HCT: 32 % — ABNORMAL LOW (ref 36.0–46.0)
Hemoglobin: 10.7 g/dL — ABNORMAL LOW (ref 12.0–15.0)
MCH: 26.3 pg (ref 26.0–34.0)
MCHC: 33.4 g/dL (ref 30.0–36.0)
MCV: 78.6 fL (ref 78.0–100.0)
PLATELETS: 286 10*3/uL (ref 150–400)
RBC: 4.07 MIL/uL (ref 3.87–5.11)
RDW: 15.1 % (ref 11.5–15.5)
WBC: 6.1 10*3/uL (ref 4.0–10.5)

## 2013-10-13 LAB — URINE MICROSCOPIC-ADD ON

## 2013-10-13 LAB — BASIC METABOLIC PANEL
ANION GAP: 9 (ref 5–15)
BUN: 15 mg/dL (ref 6–23)
CALCIUM: 9.6 mg/dL (ref 8.4–10.5)
CO2: 30 mEq/L (ref 19–32)
CREATININE: 0.92 mg/dL (ref 0.50–1.10)
Chloride: 97 mEq/L (ref 96–112)
GFR calc non Af Amer: 73 mL/min — ABNORMAL LOW (ref >90)
GFR, EST AFRICAN AMERICAN: 85 mL/min — AB (ref >90)
Glucose, Bld: 76 mg/dL (ref 70–99)
Potassium: 4.1 mEq/L (ref 3.7–5.3)
Sodium: 136 mEq/L — ABNORMAL LOW (ref 137–147)

## 2013-10-13 LAB — URINALYSIS, ROUTINE W REFLEX MICROSCOPIC
Bilirubin Urine: NEGATIVE
GLUCOSE, UA: NEGATIVE mg/dL
KETONES UR: NEGATIVE mg/dL
Leukocytes, UA: NEGATIVE
Nitrite: NEGATIVE
PH: 6 (ref 5.0–8.0)
PROTEIN: NEGATIVE mg/dL
Specific Gravity, Urine: 1.003 — ABNORMAL LOW (ref 1.005–1.030)
Urobilinogen, UA: 0.2 mg/dL (ref 0.0–1.0)

## 2013-10-13 LAB — I-STAT TROPONIN, ED: Troponin i, poc: 0 ng/mL (ref 0.00–0.08)

## 2013-10-13 MED ORDER — NITROGLYCERIN 2 % TD OINT
1.0000 [in_us] | TOPICAL_OINTMENT | Freq: Once | TRANSDERMAL | Status: AC
  Administered 2013-10-13: 1 [in_us] via TOPICAL
  Filled 2013-10-13: qty 1

## 2013-10-13 MED ORDER — ASPIRIN 81 MG PO CHEW
324.0000 mg | CHEWABLE_TABLET | Freq: Once | ORAL | Status: AC
  Administered 2013-10-13: 324 mg via ORAL
  Filled 2013-10-13: qty 4

## 2013-10-13 NOTE — ED Notes (Signed)
Attempted report 

## 2013-10-13 NOTE — ED Notes (Signed)
Pt in c/o hyptension over the last week and intermittent chest pain, seen at PCP for same and started on HCTZ but BP continues to rise when checked at home. Pt also reports nausea, fatigue and light headedness. Alert and oriented, no distress noted.

## 2013-10-14 ENCOUNTER — Other Ambulatory Visit (HOSPITAL_COMMUNITY): Payer: BC Managed Care – PPO

## 2013-10-14 ENCOUNTER — Observation Stay (HOSPITAL_COMMUNITY): Payer: BC Managed Care – PPO

## 2013-10-14 ENCOUNTER — Encounter (HOSPITAL_COMMUNITY): Payer: BC Managed Care – PPO

## 2013-10-14 ENCOUNTER — Encounter (HOSPITAL_COMMUNITY): Payer: Self-pay | Admitting: Internal Medicine

## 2013-10-14 DIAGNOSIS — D500 Iron deficiency anemia secondary to blood loss (chronic): Secondary | ICD-10-CM

## 2013-10-14 DIAGNOSIS — R0789 Other chest pain: Secondary | ICD-10-CM

## 2013-10-14 DIAGNOSIS — E669 Obesity, unspecified: Secondary | ICD-10-CM

## 2013-10-14 DIAGNOSIS — I10 Essential (primary) hypertension: Secondary | ICD-10-CM

## 2013-10-14 DIAGNOSIS — I160 Hypertensive urgency: Secondary | ICD-10-CM

## 2013-10-14 DIAGNOSIS — R072 Precordial pain: Secondary | ICD-10-CM

## 2013-10-14 DIAGNOSIS — R079 Chest pain, unspecified: Secondary | ICD-10-CM

## 2013-10-14 LAB — CBC WITH DIFFERENTIAL/PLATELET
Basophils Absolute: 0 10*3/uL (ref 0.0–0.1)
Basophils Relative: 0 % (ref 0–1)
EOS ABS: 0.1 10*3/uL (ref 0.0–0.7)
EOS PCT: 1 % (ref 0–5)
HCT: 31.8 % — ABNORMAL LOW (ref 36.0–46.0)
HEMOGLOBIN: 10.4 g/dL — AB (ref 12.0–15.0)
LYMPHS ABS: 0.9 10*3/uL (ref 0.7–4.0)
Lymphocytes Relative: 20 % (ref 12–46)
MCH: 25.8 pg — AB (ref 26.0–34.0)
MCHC: 32.7 g/dL (ref 30.0–36.0)
MCV: 78.9 fL (ref 78.0–100.0)
MONOS PCT: 11 % (ref 3–12)
Monocytes Absolute: 0.5 10*3/uL (ref 0.1–1.0)
Neutro Abs: 3.2 10*3/uL (ref 1.7–7.7)
Neutrophils Relative %: 68 % (ref 43–77)
PLATELETS: 278 10*3/uL (ref 150–400)
RBC: 4.03 MIL/uL (ref 3.87–5.11)
RDW: 15.3 % (ref 11.5–15.5)
WBC: 4.7 10*3/uL (ref 4.0–10.5)

## 2013-10-14 LAB — COMPREHENSIVE METABOLIC PANEL
ALT: 11 U/L (ref 0–35)
AST: 14 U/L (ref 0–37)
Albumin: 3.2 g/dL — ABNORMAL LOW (ref 3.5–5.2)
Alkaline Phosphatase: 80 U/L (ref 39–117)
Anion gap: 11 (ref 5–15)
BUN: 11 mg/dL (ref 6–23)
CO2: 28 meq/L (ref 19–32)
Calcium: 8.9 mg/dL (ref 8.4–10.5)
Chloride: 97 mEq/L (ref 96–112)
Creatinine, Ser: 0.79 mg/dL (ref 0.50–1.10)
GFR calc Af Amer: >90 mL/min (ref >90)
Glucose, Bld: 103 mg/dL — ABNORMAL HIGH (ref 70–99)
POTASSIUM: 3.8 meq/L (ref 3.7–5.3)
SODIUM: 136 meq/L — AB (ref 137–147)
Total Bilirubin: 0.4 mg/dL (ref 0.3–1.2)
Total Protein: 7.8 g/dL (ref 6.0–8.3)

## 2013-10-14 LAB — CBC
HCT: 30.7 % — ABNORMAL LOW (ref 36.0–46.0)
Hemoglobin: 10.3 g/dL — ABNORMAL LOW (ref 12.0–15.0)
MCH: 26.4 pg (ref 26.0–34.0)
MCHC: 33.6 g/dL (ref 30.0–36.0)
MCV: 78.7 fL (ref 78.0–100.0)
Platelets: 270 10*3/uL (ref 150–400)
RBC: 3.9 MIL/uL (ref 3.87–5.11)
RDW: 15.2 % (ref 11.5–15.5)
WBC: 6.1 10*3/uL (ref 4.0–10.5)

## 2013-10-14 LAB — CREATININE, SERUM
Creatinine, Ser: 0.88 mg/dL (ref 0.50–1.10)
GFR calc Af Amer: 89 mL/min — ABNORMAL LOW (ref >90)
GFR calc non Af Amer: 77 mL/min — ABNORMAL LOW (ref >90)

## 2013-10-14 LAB — TROPONIN I
Troponin I: <0.3 ng/mL (ref <0.30)
Troponin I: <0.3 ng/mL (ref <0.30)

## 2013-10-14 MED ORDER — NITROGLYCERIN 0.4 MG/HR TD PT24
0.4000 mg | MEDICATED_PATCH | Freq: Every day | TRANSDERMAL | Status: DC
  Filled 2013-10-14 (×2): qty 1

## 2013-10-14 MED ORDER — SODIUM CHLORIDE 0.9 % IJ SOLN
3.0000 mL | Freq: Two times a day (BID) | INTRAMUSCULAR | Status: DC
  Administered 2013-10-14 (×2): 3 mL via INTRAVENOUS

## 2013-10-14 MED ORDER — REGADENOSON 0.4 MG/5ML IV SOLN
0.4000 mg | Freq: Once | INTRAVENOUS | Status: AC
  Administered 2013-10-14: 0.4 mg via INTRAVENOUS
  Filled 2013-10-14: qty 5

## 2013-10-14 MED ORDER — OLMESARTAN MEDOXOMIL-HCTZ 20-12.5 MG PO TABS: 1.0000 | ORAL_TABLET | Freq: Every day | ORAL | Status: DC

## 2013-10-14 MED ORDER — MORPHINE SULFATE 2 MG/ML IJ SOLN: 1.0000 mg | INTRAMUSCULAR | Status: DC | PRN

## 2013-10-14 MED ORDER — HYDROCHLOROTHIAZIDE 12.5 MG PO CAPS
12.5000 mg | ORAL_CAPSULE | Freq: Every day | ORAL | Status: DC
  Administered 2013-10-14: 12.5 mg via ORAL
  Filled 2013-10-14 (×2): qty 1

## 2013-10-14 MED ORDER — ONDANSETRON HCL 4 MG PO TABS: 4.0000 mg | ORAL_TABLET | Freq: Four times a day (QID) | ORAL | Status: DC | PRN

## 2013-10-14 MED ORDER — SODIUM CHLORIDE 0.9 % IJ SOLN
80.0000 mg | INTRAMUSCULAR | Status: AC
Start: 2013-10-14 — End: 2013-10-14
  Administered 2013-10-14: 80 mg via INTRAVENOUS

## 2013-10-14 MED ORDER — ONDANSETRON HCL 4 MG/2ML IJ SOLN: 4.0000 mg | Freq: Four times a day (QID) | INTRAMUSCULAR | Status: DC | PRN

## 2013-10-14 MED ORDER — TECHNETIUM TC 99M SESTAMIBI GENERIC - CARDIOLITE
30.0000 | Freq: Once | INTRAVENOUS | Status: AC | PRN
  Administered 2013-10-14: 30 via INTRAVENOUS

## 2013-10-14 MED ORDER — HYDRALAZINE HCL 20 MG/ML IJ SOLN: 10.0000 mg | INTRAMUSCULAR | Status: DC | PRN

## 2013-10-14 MED ORDER — TECHNETIUM TC 99M SESTAMIBI GENERIC - CARDIOLITE
10.0000 | Freq: Once | INTRAVENOUS | Status: AC | PRN
  Administered 2013-10-14: 10 via INTRAVENOUS

## 2013-10-14 MED ORDER — REGADENOSON 0.4 MG/5ML IV SOLN
INTRAVENOUS | Status: AC
  Administered 2013-10-14: 0.4 mg via INTRAVENOUS
  Filled 2013-10-14: qty 5

## 2013-10-14 MED ORDER — ASPIRIN EC 325 MG PO TBEC
325.0000 mg | DELAYED_RELEASE_TABLET | Freq: Every day | ORAL | Status: DC
  Administered 2013-10-14: 325 mg via ORAL
  Filled 2013-10-14 (×2): qty 1

## 2013-10-14 MED ORDER — SODIUM CHLORIDE 0.9 % IJ SOLN: 3.0000 mL | Freq: Two times a day (BID) | INTRAMUSCULAR | Status: DC

## 2013-10-14 MED ORDER — ENOXAPARIN SODIUM 40 MG/0.4ML ~~LOC~~ SOLN
40.0000 mg | SUBCUTANEOUS | Status: DC
  Administered 2013-10-14: 40 mg via SUBCUTANEOUS
  Filled 2013-10-14 (×2): qty 0.4

## 2013-10-14 MED ORDER — ACETAMINOPHEN 650 MG RE SUPP: 650.0000 mg | Freq: Four times a day (QID) | RECTAL | Status: DC | PRN

## 2013-10-14 MED ORDER — IRBESARTAN 150 MG PO TABS
150.0000 mg | ORAL_TABLET | Freq: Every day | ORAL | Status: DC
  Administered 2013-10-14: 150 mg via ORAL
  Filled 2013-10-14 (×2): qty 1

## 2013-10-14 MED ORDER — ACETAMINOPHEN 325 MG PO TABS
650.0000 mg | ORAL_TABLET | Freq: Four times a day (QID) | ORAL | Status: DC | PRN
Start: 2013-10-14 — End: 2013-10-15
  Administered 2013-10-14: 650 mg via ORAL
  Filled 2013-10-14: qty 2

## 2013-10-14 NOTE — Progress Notes (Signed)
  Echocardiogram 2D Echocardiogram has been performed.  Belinda Petersen 10/14/2013, 4:37 PM

## 2013-10-14 NOTE — Progress Notes (Signed)
Patient seen and evaluated earlier this AM by my associate.  Please refer to his H and P for details regarding assessment and plan.  Cardiology consulted given family hx of brother having heart attack in his 72's and history of chest pain on and off for the last month.   Will reassess next am.  Velvet Bathe

## 2013-10-14 NOTE — Consult Note (Signed)
Admit date: 10/13/2013 Referring Physician  Dr. Wendee Beavers Primary Physician Foye Spurling, MD Primary Cardiologist  None Reason for Consultation  Chest pain  HPI: 48 year old whose brother had myocardial infarction in his 69s presented with chest pain. She has a history of hypertension with blood pressure being as high as 170/130, recently started on antihypertensives last week. She began having chest pressure/pain over the past few months. Sometimes pain would occur at night and if she lay on right side, discomfort would improve. No significant vomiting, shortness of breath, fevers, chills. The pain was described as substernal, radiating to her left arm with no exertional component. She is concerned because of her brothers heart history.  She was seen in the outpatient setting on 09/22/12 were hypertension was noted, blood pressure 178/100.   PMH:   Past Medical History  Diagnosis Date  . Iron deficiency anemia secondary to blood loss (chronic)   . Menorrhagia   . Fibroids   . SVD (spontaneous vaginal delivery)     x 1  . HTN (hypertension)     no meds    PSH:   Past Surgical History  Procedure Laterality Date  . Wisdom tooth extraction    . Dilitation & currettage/hystroscopy with versapoint resection N/A 06/08/2012    Procedure: DILATATION & CURETTAGE/HYSTEROSCOPY WITH VERSAPOINT RESECTION;  Surgeon: Lyman Speller, MD;  Location: [facility];  Service: Gynecology;  Laterality: N/A;   Allergies:  Review of patient's allergies indicates no known allergies. Prior to Admit Meds:   Prior to Admission medications   Medication Sig Start Date End Date Taking? Authorizing Provider  olmesartan-hydrochlorothiazide (BENICAR HCT) 20-12.5 MG per tablet Take 1 tablet by mouth daily.   Yes Historical Provider, MD   Fam HX:    Family History  Problem Relation Age of Onset  . Diabetes Father   . Hypertension Mother     deceased  . Heart disease Mother     deceased  . Hypertension Brother    . Hypertension Brother   . Hypertension Brother   . Hypertension Sister   . Heart disease Brother    Social HX:    History   Social History  . Marital Status: Married    Spouse Name: N/A    Number of Children: N/A  . Years of Education: N/A   Occupational History  . Not on file.   Social History Main Topics  . Smoking status: Never Smoker   . Smokeless tobacco: Never Used  . Alcohol Use: Yes     Comment: socially  . Drug Use: No  . Sexual Activity: Yes    Birth Control/ Protection: None   Other Topics Concern  . Not on file   Social History Narrative  . No narrative on file     ROS:  All 11 ROS were addressed and are negative except what is stated in the HPI   Physical Exam: Blood pressure 108/74, pulse 93, temperature 98.7 F (37.1 C), temperature source Oral, resp. rate 18, height 5\' 3"  (1.6 m), weight 232 lb 1.6 oz (105.28 kg), last menstrual period 10/01/2013, SpO2 98.00%.   General: Well developed, well nourished, in no acute distress Head: Eyes PERRLA, No xanthomas.   Normal cephalic and atramatic  Lungs:   Clear bilaterally to auscultation and percussion. Normal respiratory effort. No wheezes, no rales. Heart:   HRRR S1 S2 Pulses are 2+ & equal. No murmur, rubs, gallops.  No carotid bruit. No JVD.  No abdominal bruits.  Abdomen:  Bowel sounds are positive, abdomen soft and non-tender without masses. No hepatosplenomegaly. Overweight. Msk:  Back normal. Normal strength and tone for age. Extremities:  No clubbing, cyanosis or edema.  DP +1 Neuro: Alert and oriented X 3, non-focal, MAE x 4 GU: Deferred Rectal: Deferred Psych:  Good affect, responds appropriately      Labs: Lab Results  Component Value Date   WBC 6.1 10/14/2013   HGB 10.3* 10/14/2013   HCT 30.7* 10/14/2013   MCV 78.7 10/14/2013   PLT 270 10/14/2013     Recent Labs Lab 10/13/13 2004 10/14/13 0052  NA 136*  --   K 4.1  --   CL 97  --   CO2 30  --   BUN 15  --   CREATININE 0.92 0.88    CALCIUM 9.6  --   GLUCOSE 76  --     Recent Labs  10/14/13 0052  TROPONINI <0.30     Radiology:  Dg Chest Port 1 View  10/14/2013   CLINICAL DATA:  Left-sided chest pain  EXAM: PORTABLE CHEST - 1 VIEW  COMPARISON:  None.  FINDINGS: The heart size and mediastinal contours are within normal limits. Both lungs are clear. The visualized skeletal structures are unremarkable.  IMPRESSION: No radiographic evidence of active cardiopulmonary disease.   Electronically Signed   By: Carlos Levering M.D.   On: 10/14/2013 00:33   Personally viewed.  EKG:  10/13/13-sinus rhythm, nonspecific ST-T wave changes, heart rate 93 beats per minute, no significant change from prior Personally viewed.   ASSESSMENT/PLAN:    48 year old with family history of brother having heart attack in his 37s with intermittent chest pain.  1. Chest pain - Fairly atypical. Could be MSK or GERD related however given her brother's history of MI, her HTN, obesity, will proceed with stress test. Blood pressure currently under good control 108/74. Troponin normal. We'll proceed with nuclear stress test. If low risk, hopeful discharge later today.   2. Hypertension-currently under good control on current medication.  3. Obesity-encourage weight loss.  4. Family history of CAD-Brother, MI in his 22s.  5. Iron deficiency anemia-menorrhagia.   Candee Furbish, MD  10/14/2013  8:44 AM

## 2013-10-14 NOTE — Progress Notes (Signed)
Thank you Candee Furbish, MD

## 2013-10-14 NOTE — H&P (Addendum)
[facility] History and Physical  Shandy Checo BPZ:[id number] DOB: [birth year] DOA: 10/13/2013  Referring physician: ER physician. PCP: Foye Spurling, MD   Chief Complaint: Chest pain.  HPI: Belinda Petersen is a 48 y.o. female with history of hypertension recently started antihypertensive last week presents to the ER because patient was found to have elevated blood pressure at home. Patient states that she check blood pressure at her home and was found to have blood pressure 170/130 presented to the ER. Patient states she also has been having chest pressure and pain off and on for last few months. EKG and cardiac markers were negative min patient's blood pressure was found to be elevated and was placed on nitroglycerin patch following which patient's symptoms largely improved and has been admitted for further observation. Patient denies any nausea vomiting abdominal pain shortness of breath fever chills productive cough. Patient's chest pain is retrosternal radiating to left arm. Present even at rest and has no relation to exertion. After placing nitroglycerin patch patient has been having some headache. Denies any focal deficits or visual symptoms.  Review of Systems: As presented in the history of presenting illness, rest negative.  Past Medical History  Diagnosis Date  . Iron deficiency anemia secondary to blood loss (chronic)   . Menorrhagia   . Fibroids   . SVD (spontaneous vaginal delivery)     x 1  . HTN (hypertension)     no meds   Past Surgical History  Procedure Laterality Date  . Wisdom tooth extraction    . Dilitation & currettage/hystroscopy with versapoint resection N/A 06/08/2012    Procedure: DILATATION & CURETTAGE/HYSTEROSCOPY WITH VERSAPOINT RESECTION;  Surgeon: Lyman Speller, MD;  Location: [facility];  Service: Gynecology;  Laterality: N/A;   Social History:  reports that she has never smoked. She has never used smokeless tobacco. She reports that she  drinks alcohol. She reports that she does not use illicit drugs. Where does patient live home Can patient participate in ADLs? Yes  No Known Allergies  Family History:  Family History  Problem Relation Age of Onset  . Diabetes Father   . Hypertension Mother     deceased  . Heart disease Mother     deceased  . Hypertension Brother   . Hypertension Brother   . Hypertension Brother   . Hypertension Sister   . Heart disease Brother       Prior to Admission medications   Medication Sig Start Date End Date Taking? Authorizing Provider  olmesartan-hydrochlorothiazide (BENICAR HCT) 20-12.5 MG per tablet Take 1 tablet by mouth daily.   Yes Historical Provider, MD    Physical Exam: Filed Vitals:   10/13/13 2122 10/13/13 2145 10/13/13 2230 10/13/13 2322  BP: 169/112 146/95 137/91 139/102  Pulse: 90 93 95 101  Temp: 98.1 F (36.7 C)   97.6 F (36.4 C)  TempSrc: Oral   Oral  Resp: 16 14 16    Height:    5\' 3"  (1.6 m)  Weight:    105.28 kg (232 lb 1.6 oz)  SpO2: 100% 99% 96% 100%     General:  Well-developed well-nourished.  Eyes: Anicteric no pallor.  ENT: No discharge ears eyes nose mouth  Neck: No JVD appreciated. No mass felt.  Cardiovascular: S1-S2 heard.  Respiratory: No rhonchi or crepitations.  Abdomen: Soft nontender bowel sounds present. No guarding or rigidity.  Skin: No rash.  Musculoskeletal: No edema.  Psychiatric: Appears normal.  Neurologic: Alert awake oriented to time place and person.  Moves all extremities.  Labs on Admission:  Basic Metabolic Panel:  Recent Labs Lab 10/13/13 2004  NA 136*  K 4.1  CL 97  CO2 30  GLUCOSE 76  BUN 15  CREATININE 0.92  CALCIUM 9.6   Liver Function Tests: No results found for this basename: AST, ALT, ALKPHOS, BILITOT, PROT, ALBUMIN,  in the last 168 hours No results found for this basename: LIPASE, AMYLASE,  in the last 168 hours No results found for this basename: AMMONIA,  in the last 168  hours CBC:  Recent Labs Lab 10/13/13 2004  WBC 6.1  HGB 10.7*  HCT 32.0*  MCV 78.6  PLT 286   Cardiac Enzymes: No results found for this basename: CKTOTAL, CKMB, CKMBINDEX, TROPONINI,  in the last 168 hours  BNP (last 3 results) No results found for this basename: PROBNP,  in the last 8760 hours CBG: No results found for this basename: GLUCAP,  in the last 168 hours  Radiological Exams on Admission: No results found.  EKG: Independently reviewed. Normal sinus rhythm.  Assessment/Plan Principal Problem:   Hypertensive urgency Active Problems:   Iron deficiency anemia secondary to blood loss (chronic)   Chest pain   1. Chest pain - improved with control of blood pressure. Patient is on nitroglycerin patch. Cycle cardiac markers. Check 2-D echo. Check chest x-ray. Aspirin. Control blood pressure. We'll keep patient n.p.o. past 4 AM in anticipation of possible procedure 2. Hypertensive urgency - continue patient's Benicar and HCTZ. Patient is placed on clear IV hydralazine. 3. Headache - probably precipitated by nitroglycerin I have ordered Tylenol if headache does not get better may have to discontinue nitroglycerin patch. 4. Iron deficiency anemia - follow CBC.    Code Status: Full code.  Family Communication: Family at the bedside.  Disposition Plan: Admit for observation.    Jess Toney N. [facility] Pager [phone].  If 7PM-7AM, please contact night-coverage [email or web address] Password [email or web address] 10/14/2013, 12:30 AM

## 2013-10-14 NOTE — Progress Notes (Signed)
UR Completed.  Dorrance Sellick Jane [phone] 10/14/2013  

## 2013-10-14 NOTE — Progress Notes (Signed)
Lexiscan myoview completed without complications.  + chest pain and SOB resolved with aminophylline. nuc results to follow.

## 2013-10-14 NOTE — Progress Notes (Signed)
Dr Radford Pax read her Nuc study.  It was negative for ischemia. Would recommend BP control.  No need to follow up except with PCP.

## 2013-10-15 DIAGNOSIS — I10 Essential (primary) hypertension: Secondary | ICD-10-CM

## 2013-10-15 NOTE — Discharge Summary (Signed)
Physician Discharge Summary  Belinda Petersen BMW:[id number] DOB: 1965-05-01 DOA: 10/13/2013  PCP: Foye Spurling, MD  Admit date: 10/13/2013 Discharge date: 10/15/2013  Time spent: > 35 minutes  Recommendations for Outpatient Follow-up:  1. Please be sure to f/u with your primary care physician for further evaluation or recommendations.  Discharge Diagnoses:  Principal Problem:   Hypertensive urgency Active Problems:   Iron deficiency anemia secondary to blood loss (chronic)   Chest pain   Discharge Condition: stable  Diet recommendation: heart healthy  Filed Weights   10/13/13 2322  Weight: 105.28 kg (232 lb 1.6 oz)    History of present illness:  From original HPI: Belinda Petersen is a 48 y.o. female with history of hypertension recently started antihypertensive last week presents to the ER because patient was found to have elevated blood pressure at home. Patient states that she check blood pressure at her home and was found to have blood pressure 170/130 presented to the ER.    Hospital Course:  Chest pain - Cardiology consulted and work up negative for cardiac source - resolved on day of d/c  HTN - continue home regimen  Procedures:  Myoview  Echocardiogram:   Consultations:  Cardiology  Discharge Exam: Filed Vitals:   10/15/13 0500  BP: 138/85  Pulse: 88  Temp: 98.4 F (36.9 C)  Resp: 18    General: PT in nad, alert and awake Cardiovascular: rrr, no mrg Respiratory: CTA BL, no wheezes  Discharge Instructions You were cared for by a hospitalist during your hospital stay. If you have any questions about your discharge medications or the care you received while you were in the hospital after you are discharged, you can call the unit and asked to speak with the hospitalist on call if the hospitalist that took care of you is not available. Once you are discharged, your primary care physician will handle any further medical issues. Please note that NO  REFILLS for any discharge medications will be authorized once you are discharged, as it is imperative that you return to your primary care physician (or establish a relationship with a primary care physician if you do not have one) for your aftercare needs so that they can reassess your need for medications and monitor your lab values.  Discharge Instructions   Call MD for:  extreme fatigue    Complete by:  As directed      Call MD for:  severe uncontrolled pain    Complete by:  As directed      Call MD for:  temperature >100.4    Complete by:  As directed      Diet - low sodium heart healthy    Complete by:  As directed      Increase activity slowly    Complete by:  As directed           Current Discharge Medication List    CONTINUE these medications which have NOT CHANGED   Details  olmesartan-hydrochlorothiazide (BENICAR HCT) 20-12.5 MG per tablet Take 1 tablet by mouth daily.       No Known Allergies    The results of significant diagnostics from this hospitalization (including imaging, microbiology, ancillary and laboratory) are listed below for reference.    Significant Diagnostic Studies: Nm Myocar Multi W/spect W/wall Motion / Ef  10/14/2013   CLINICAL DATA:  Chest Pain  EXAM: MYOCARDIAL IMAGING WITH SPECT (REST AND PHARMACOLOGIC-STRESS)  GATED LEFT VENTRICULAR WALL MOTION STUDY  LEFT VENTRICULAR EJECTION FRACTION  TECHNIQUE:  Standard myocardial SPECT imaging was performed after resting intravenous injection of 10 mCi Tc-61m sestamibi. Subsequently, intravenous infusion of Lexiscan was performed under the supervision of the Cardiology staff. At peak effect of the drug, 30 mCi Tc-63m sestamibi was injected intravenously and standard myocardial SPECT imaging was performed. Quantitative gated imaging was also performed to evaluate left ventricular wall motion, and estimate left ventricular ejection fraction.  COMPARISON:  None.  FINDINGS: Baseline EKG showed NSR at 87bpm with no  ST changes. During infusion of Lexiscan the patient complained of upper chest pain and then midsternal chest pain that was non limiting. This resolved with aminophylline. There were no ST changes of ischemia noted on EKG during infusion or in recovery.  Perfusion: Decreased activity was noted in the inferior wall on rest images and apical anterolateral wall on both rest and stress imaging.  Wall Motion: Normal left ventricular wall motion. No left ventricular dilation.  Left Ventricular Ejection Fraction: 61 %  IMPRESSION: 1. No reversible ischemia or infarction.  2. Normal left ventricular wall motion.  3. Left ventricular ejection fraction 61%  4. Low-risk stress test findings with a fixed defect in the apical anterolateral wall consistent with breast attenuation artifact. There was no ischemia. There was abnormal uptake of radiotracer in both forearms. Clniical correlation recommended.  *2012 Appropriate Use Criteria for Coronary Revascularization Focused Update: J Am Coll Cardiol. 6237;62(8):315-176. http://content.airportbarriers.com.aspx?articleid=1201161   Electronically Signed   By: Fransico Him   On: 10/14/2013 15:37   Dg Chest Port 1 View  10/14/2013   CLINICAL DATA:  Left-sided chest pain  EXAM: PORTABLE CHEST - 1 VIEW  COMPARISON:  None.  FINDINGS: The heart size and mediastinal contours are within normal limits. Both lungs are clear. The visualized skeletal structures are unremarkable.  IMPRESSION: No radiographic evidence of active cardiopulmonary disease.   Electronically Signed   By: Carlos Levering M.D.   On: 10/14/2013 00:33    Microbiology: No results found for this or any previous visit (from the past 240 hour(s)).   Labs: Basic Metabolic Panel:  Recent Labs Lab 10/13/13 2004 10/14/13 0052 10/14/13 1515  NA 136*  --  136*  K 4.1  --  3.8  CL 97  --  97  CO2 30  --  28  GLUCOSE 76  --  103*  BUN 15  --  11  CREATININE 0.92 0.88 0.79  CALCIUM 9.6  --  8.9   Liver  Function Tests:  Recent Labs Lab 10/14/13 1515  AST 14  ALT 11  ALKPHOS 80  BILITOT 0.4  PROT 7.8  ALBUMIN 3.2*   No results found for this basename: LIPASE, AMYLASE,  in the last 168 hours No results found for this basename: AMMONIA,  in the last 168 hours CBC:  Recent Labs Lab 10/13/13 2004 10/14/13 0052 10/14/13 1515  WBC 6.1 6.1 4.7  NEUTROABS  --   --  3.2  HGB 10.7* 10.3* 10.4*  HCT 32.0* 30.7* 31.8*  MCV 78.6 78.7 78.9  PLT 286 270 278   Cardiac Enzymes:  Recent Labs Lab 10/14/13 0052 10/14/13 1515 10/14/13 2011  TROPONINI <0.30 <0.30 <0.30   BNP: BNP (last 3 results) No results found for this basename: PROBNP,  in the last 8760 hours CBG: No results found for this basename: GLUCAP,  in the last 168 hours     Signed:  Velvet Bathe  [facility] 10/15/2013, 9:03 AM

## 2013-10-15 NOTE — Discharge Instructions (Signed)
Cardiac Diet This diet can help prevent heart disease and stroke. Many factors influence your heart health, including eating and exercise habits. Coronary risk rises a lot with abnormal blood fat (lipid) levels. Cardiac meal planning includes limiting unhealthy fats, increasing healthy fats, and making other small dietary changes. General guidelines are as follows:  Adjust calorie intake to reach and maintain desirable body weight.  Limit total fat intake to less than 30% of total calories. Saturated fat should be less than 7% of calories.  Saturated fats are found in animal products and in some vegetable products. Saturated vegetable fats are found in coconut oil, cocoa butter, palm oil, and palm kernel oil. Read labels carefully to avoid these products as much as possible. Use butter in moderation. Choose tub margarines and oils that have 2 grams of fat or less. Good cooking oils are canola and olive oils.  Practice low-fat cooking techniques. Do not fry food. Instead, broil, bake, boil, steam, grill, roast on a rack, stir-fry, or microwave it. Other fat reducing suggestions include:  Remove the skin from poultry.  Remove all visible fat from meats.  Skim the fat off stews, soups, and gravies before serving them.  Steam vegetables in water or broth instead of sauting them in fat.  Avoid foods with trans fat (or hydrogenated oils), such as commercially fried foods and commercially baked goods. Commercial shortening and deep-frying fats will contain trans fat.  Increase intake of fruits, vegetables, whole grains, and legumes to replace foods high in fat.  Increase consumption of nuts, legumes, and seeds to at least 4 servings weekly. One serving of a legume equals  cup, and 1 serving of nuts or seeds equals  cup.  Choose whole grains more often. Have 3 servings per day (a serving is 1 ounce [oz]).  Eat 4 to 5 servings of vegetables per day. A serving of vegetables is 1 cup of raw leafy  vegetables;  cup of raw or cooked cut-up vegetables;  cup of vegetable juice.  Eat 4 to 5 servings of fruit per day. A serving of fruit is 1 medium whole fruit;  cup of dried fruit;  cup of fresh, frozen, or canned fruit;  cup of 100% fruit juice.  Increase your intake of dietary fiber to 20 to 30 grams per day. Insoluble fiber may help lower your risk of heart disease and may help curb your appetite.  Soluble fiber binds cholesterol to be removed from the blood. Foods high in soluble fiber are dried beans, citrus fruits, oats, apples, bananas, broccoli, Brussels sprouts, and eggplant.  Try to include foods fortified with plant sterols or stanols, such as yogurt, breads, juices, or margarines. Choose several fortified foods to achieve a daily intake of 2 to 3 grams of plant sterols or stanols.  Foods with omega-3 fats can help reduce your risk of heart disease. Aim to have a 3.5 oz portion of fatty fish twice per week, such as salmon, mackerel, albacore tuna, sardines, lake trout, or herring. If you wish to take a fish oil supplement, choose one that contains 1 gram of both DHA and EPA.  Limit processed meats to 2 servings (3 oz portion) weekly.  Limit the sodium in your diet to 1500 milligrams (mg) per day. If you have high blood pressure, talk to a registered dietitian about a DASH (Dietary Approaches to Stop Hypertension) eating plan.  Limit sweets and beverages with added sugar, such as soda, to no more than 5 servings per week. One   serving is:   1 tablespoon sugar.  1 tablespoon jelly or jam.   cup sorbet.  1 cup lemonade.   cup regular soda. CHOOSING FOODS Starches  Allowed: Breads: All kinds (wheat, rye, raisin, white, oatmeal, Italian, French, and English muffin bread). Low-fat rolls: English muffins, frankfurter and hamburger buns, bagels, pita bread, tortillas (not fried). Pancakes, waffles, biscuits, and muffins made with recommended oil.  Avoid: Products made with  saturated or trans fats, oils, or whole milk products. Butter rolls, cheese breads, croissants. Commercial doughnuts, muffins, sweet rolls, biscuits, waffles, pancakes, store-bought mixes. Crackers  Allowed: Low-fat crackers and snacks: Animal, graham, rye, saltine (with recommended oil, no lard), oyster, and matzo crackers. Bread sticks, melba toast, rusks, flatbread, pretzels, and light popcorn.  Avoid: High-fat crackers: cheese crackers, butter crackers, and those made with coconut, palm oil, or trans fat (hydrogenated oils). Buttered popcorn. Cereals  Allowed: Hot or cold whole-grain cereals.  Avoid: Cereals containing coconut, hydrogenated vegetable fat, or animal fat. Potatoes / Pasta / Rice  Allowed: All kinds of potatoes, rice, and pasta (such as macaroni, spaghetti, and noodles).  Avoid: Pasta or rice prepared with cream sauce or high-fat cheese. Chow mein noodles, French fries. Vegetables  Allowed: All vegetables and vegetable juices.  Avoid: Fried vegetables. Vegetables in cream, butter, or high-fat cheese sauces. Limit coconut. Fruit in cream or custard. Protein  Allowed: Limit your intake of meat, seafood, and poultry to no more than 6 oz (cooked weight) per day. All lean, well-trimmed beef, veal, pork, and lamb. All chicken and turkey without skin. All fish and shellfish. Wild game: wild duck, rabbit, pheasant, and venison. Egg whites or low-cholesterol egg substitutes may be used as desired. Meatless dishes: recipes with dried beans, peas, lentils, and tofu (soybean curd). Seeds and nuts: all seeds and most nuts.  Avoid: Prime grade and other heavily marbled and fatty meats, such as short ribs, spare ribs, rib eye roast or steak, frankfurters, sausage, bacon, and high-fat luncheon meats, mutton. Caviar. Commercially fried fish. Domestic duck, goose, venison sausage. Organ meats: liver, gizzard, heart, chitterlings, brains, kidney, sweetbreads. Dairy  Allowed: Low-fat  cheeses: nonfat or low-fat cottage cheese (1% or 2% fat), cheeses made with part skim milk, such as mozzarella, farmers, string, or ricotta. (Cheeses should be labeled no more than 2 to 6 grams fat per oz.). Skim (or 1%) milk: liquid, powdered, or evaporated. Buttermilk made with low-fat milk. Drinks made with skim or low-fat milk or cocoa. Chocolate milk or cocoa made with skim or low-fat (1%) milk. Nonfat or low-fat yogurt.  Avoid: Whole milk cheeses, including colby, cheddar, muenster, Monterey Jack, Havarti, Brie, Camembert, American, Swiss, and blue. Creamed cottage cheese, cream cheese. Whole milk and whole milk products, including buttermilk or yogurt made from whole milk, drinks made from whole milk. Condensed milk, evaporated whole milk, and 2% milk. Soups and Combination Foods  Allowed: Low-fat low-sodium soups: broth, dehydrated soups, homemade broth, soups with the fat removed, homemade cream soups made with skim or low-fat milk. Low-fat spaghetti, lasagna, chili, and Spanish rice if low-fat ingredients and low-fat cooking techniques are used.  Avoid: Cream soups made with whole milk, cream, or high-fat cheese. All other soups. Desserts and Sweets  Allowed: Sherbet, fruit ices, gelatins, meringues, and angel food cake. Homemade desserts with recommended fats, oils, and milk products. Jam, jelly, honey, marmalade, sugars, and syrups. Pure sugar candy, such as gum drops, hard candy, jelly beans, marshmallows, mints, and small amounts of dark chocolate.  Avoid: Commercially prepared   cakes, pies, cookies, frosting, pudding, or mixes for these products. Desserts containing whole milk products, chocolate, coconut, lard, palm oil, or palm kernel oil. Ice cream or ice cream drinks. Candy that contains chocolate, coconut, butter, hydrogenated fat, or unknown ingredients. Buttered syrups. Fats and Oils  Allowed: Vegetable oils: safflower, sunflower, corn, soybean, cottonseed, sesame, canola, olive,  or peanut. Non-hydrogenated margarines. Salad dressing or mayonnaise: homemade or commercial, made with a recommended oil. Low or nonfat salad dressing or mayonnaise.  Limit added fats and oils to 6 to 8 tsp per day (includes fats used in cooking, baking, salads, and spreads on bread). Remember to count the "hidden fats" in foods.  Avoid: Solid fats and shortenings: butter, lard, salt pork, bacon drippings. Gravy containing meat fat, shortening, or suet. Cocoa butter, coconut. Coconut oil, palm oil, palm kernel oil, or hydrogenated oils: these ingredients are often used in bakery products, nondairy creamers, whipped toppings, candy, and commercially fried foods. Read labels carefully. Salad dressings made of unknown oils, sour cream, or cheese, such as blue cheese and Roquefort. Cream, all kinds: half-and-half, light, heavy, or whipping. Sour cream or cream cheese (even if "light" or low-fat). Nondairy cream substitutes: coffee creamers and sour cream substitutes made with palm, palm kernel, hydrogenated oils, or coconut oil. Beverages  Allowed: Coffee (regular or decaffeinated), tea. Diet carbonated beverages, mineral water. Alcohol: Check with your caregiver. Moderation is recommended.  Avoid: Whole milk, regular sodas, and juice drinks with added sugar. Condiments  Allowed: All seasonings and condiments. Cocoa powder. "Cream" sauces made with recommended ingredients.  Avoid: Carob powder made with hydrogenated fats. SAMPLE MENU Breakfast   cup orange juice   cup oatmeal  1 slice toast  1 tsp margarine  1 cup skim milk Lunch  Turkey sandwich with 2 oz turkey, 2 slices bread  Lettuce and tomato slices  Fresh fruit  Carrot sticks  Coffee or tea Snack  Fresh fruit or low-fat crackers Dinner  3 oz lean ground beef  1 baked potato  1 tsp margarine   cup asparagus  Lettuce salad  1 tbs non-creamy dressing   cup peach slices  1 cup skim milk Document Released:  11/07/2007 Document Revised: 07/30/2011 Document Reviewed: 03/30/2013 [employer] Patient Information 2015 [employer]. This information is not intended to replace advice given to you by your health care provider. Make sure you discuss any questions you have with your health care provider.  

## 2013-10-17 NOTE — ED Provider Notes (Signed)
CSN: [id number]     Arrival date & time 10/13/13  1940 History   First MD Initiated Contact with Patient 10/13/13 2009     Chief Complaint  Patient presents with  . Chest Pain     (Consider location/radiation/quality/duration/timing/severity/associated sxs/prior Treatment) HPI  Belinda Petersen is a(n) 48 y.o. female who presents emergency Department with chief complaint of hyper tension and chest discomfort. Patient states she was recently diagnosed with hypertension and started on Benicar. Patient states that she was feeling unwell when she woke up this morning. Around 5 PM she started having discomfort and pain in the left shoulder with associated chest tightness and numbness in the left arm. She checked her blood pressure and noticed that her diastolic number was above 140. Patient states his prompted her visit here at the emergency department. She states she has never had blood pressures is elevated. She endorses taking her medication as prescribed past few days and specifically today. She states she's continued to have intermittent pain on the left. It is not worsened with exertion. She is no associated shortness of breath, nausea, vomiting, jaw pain or diaphoresis. She denies any recent upper respiratory infections. The patient denies a history of hypercholesterolemia, diabetes, smoking. She has a history of a brother had a heart attack in his 88s.    Past Medical History  Diagnosis Date  . Iron deficiency anemia secondary to blood loss (chronic)   . Menorrhagia   . Fibroids   . SVD (spontaneous vaginal delivery)     x 1  . HTN (hypertension)     no meds  . AJOINOMV(672.0)    Past Surgical History  Procedure Laterality Date  . Wisdom tooth extraction    . Dilitation & currettage/hystroscopy with versapoint resection N/A 06/08/2012    Procedure: DILATATION & CURETTAGE/HYSTEROSCOPY WITH VERSAPOINT RESECTION;  Surgeon: Lyman Speller, MD;  Location: [facility];  Service:  Gynecology;  Laterality: N/A;   Family History  Problem Relation Age of Onset  . Diabetes Father   . Hypertension Mother     deceased  . Heart disease Mother     deceased  . Hypertension Brother   . Hypertension Brother   . Hypertension Brother   . Hypertension Sister   . Heart disease Brother    History  Substance Use Topics  . Smoking status: Never Smoker   . Smokeless tobacco: Never Used  . Alcohol Use: Yes     Comment: socially   OB History   Grav Para Term Preterm Abortions TAB SAB Ect Mult Living   1 1             Review of Systems  Ten systems reviewed and are negative for acute change, except as noted in the HPI.    Allergies  Review of patient's allergies indicates no known allergies.  Home Medications   Prior to Admission medications   Medication Sig Start Date End Date Taking? Authorizing Provider  olmesartan-hydrochlorothiazide (BENICAR HCT) 20-12.5 MG per tablet Take 1 tablet by mouth daily.   Yes Historical Provider, MD   BP 138/85  Pulse 88  Temp(Src) 98.4 F (36.9 C) (Oral)  Resp 18  Ht 5\' 3"  (1.6 m)  Wt 232 lb 1.6 oz (105.28 kg)  BMI 41.13 kg/m2  SpO2 99%  LMP 10/01/2013 Physical Exam Physical Exam  Nursing note and vitals reviewed. Constitutional: She is oriented to person, place, and time. She appears well-developed and well-nourished. No distress.  HENT:  Head: Normocephalic and atraumatic.  Eyes: Conjunctivae normal and EOM are normal. Pupils are equal, round, and reactive to light. No scleral icterus.  Neck: Normal range of motion.  Cardiovascular: Normal rate, regular rhythm and normal heart sounds.  Exam reveals no gallop and no friction rub.   No murmur heard. Pulmonary/Chest: Effort normal and breath sounds normal. No respiratory distress.  Abdominal: Soft. Bowel sounds are normal. She exhibits no distension and no mass. There is no tenderness. There is no guarding.  Neurological: She is alert and oriented to person, place, and  time.  Skin: Skin is warm and dry. She is not diaphoretic.    ED Course  Procedures (including critical care time) Labs Review Labs Reviewed  CBC - Abnormal; Notable for the following:    Hemoglobin 10.7 (*)    HCT 32.0 (*)    All other components within normal limits  BASIC METABOLIC PANEL - Abnormal; Notable for the following:    Sodium 136 (*)    GFR calc non Af Amer 73 (*)    GFR calc Af Amer 85 (*)    All other components within normal limits  URINALYSIS, ROUTINE W REFLEX MICROSCOPIC - Abnormal; Notable for the following:    Specific Gravity, Urine 1.003 (*)    Hgb urine dipstick TRACE (*)    All other components within normal limits  COMPREHENSIVE METABOLIC PANEL - Abnormal; Notable for the following:    Sodium 136 (*)    Glucose, Bld 103 (*)    Albumin 3.2 (*)    All other components within normal limits  CBC WITH DIFFERENTIAL - Abnormal; Notable for the following:    Hemoglobin 10.4 (*)    HCT 31.8 (*)    MCH 25.8 (*)    All other components within normal limits  CBC - Abnormal; Notable for the following:    Hemoglobin 10.3 (*)    HCT 30.7 (*)    All other components within normal limits  CREATININE, SERUM - Abnormal; Notable for the following:    GFR calc non Af Amer 77 (*)    GFR calc Af Amer 89 (*)    All other components within normal limits  URINE MICROSCOPIC-ADD ON  TROPONIN I  TROPONIN I  TROPONIN I  I-STAT TROPOININ, ED    Imaging Review No results found.   EKG Interpretation   Date/Time:  Wednesday October 13 2013 20:00:34 EDT Ventricular Rate:  93 PR Interval:  132 QRS Duration: 78 QT Interval:  350 QTC Calculation: 435 R Axis:   69 Text Interpretation:  Normal sinus rhythm Nonspecific ST abnormality  Abnormal ECG No old tracing to compare Confirmed by [facility]  MD, Nunzio Cory  [phone]) on 10/13/2013 9:09:52 PM      MDM   Final diagnoses:  Hypertensive urgency  Other chest pain   9:25 PM Filed Vitals:   10/14/13 1139 10/14/13  1430 10/14/13 2100 10/15/13 0500  BP: 128/71 135/95 143/92 138/85  Pulse:  94 91 88  Temp:  99 F (37.2 C) 98.6 F (37 C) 98.4 F (36.9 C)  TempSrc:  Oral Oral Oral  Resp:  18 18 18   Height:      Weight:      SpO2:  99% 98% 99%   patient is here with complaint of chest discomfort, found to be markedly hypertensive. Has ordered aspirin and nitro paste.    9:25 PM BP 138/85  Pulse 88  Temp(Src) 98.4 F (36.9 C) (Oral)  Resp 18  Ht 5\' 3"  (1.6 m)  Wt 232 lb 1.6 oz (105.28 kg)  BMI 41.13 kg/m2  SpO2 99%  LMP 10/01/2013 Patient htn decreasing Her chest pain and tightness have resolved with the nitro. EKG is unremarkable for acute signs of ischemia. CXR without acute abnormality. Labs and troponin otherwise unremarkable.  9:25 PM BP 138/85  Pulse 88  Temp(Src) 98.4 F (36.9 C) (Oral)  Resp 18  Ht 5\' 3"  (1.6 m)  Wt 232 lb 1.6 oz (105.28 kg)  BMI 41.13 kg/m2  SpO2 99%  LMP 10/01/2013 Patient will be admitted for hypertensive urgency and cp r/o. No repeat cp here in the ED. DR Hal Hope to admit. Patient / Family / Caregiver informed of clinical course, understand medical decision-making process, and agree with plan.      Margarita Mail, PA-C 10/17/13 2125

## 2013-10-17 NOTE — ED Provider Notes (Signed)
Medical screening examination/treatment/procedure(s) were performed by non-physician practitioner and as supervising physician I was immediately available for consultation/collaboration.   EKG Interpretation   Date/Time:  Wednesday October 13 2013 20:00:34 EDT Ventricular Rate:  93 PR Interval:  132 QRS Duration: 78 QT Interval:  350 QTC Calculation: 435 R Axis:   69 Text Interpretation:  Normal sinus rhythm Nonspecific ST abnormality  Abnormal ECG No old tracing to compare Confirmed by [facility]  MD, Nunzio Cory  [phone]) on 10/13/2013 9:09:52 PM        Francine Graven, DO 10/17/13 1649

## 2013-10-19 NOTE — ED Provider Notes (Signed)
Medical screening examination/treatment/procedure(s) were performed by non-physician practitioner and as supervising physician I was immediately available for consultation/collaboration.   EKG Interpretation   Date/Time:  Wednesday October 13 2013 20:00:34 EDT Ventricular Rate:  93 PR Interval:  132 QRS Duration: 78 QT Interval:  350 QTC Calculation: 435 R Axis:   69 Text Interpretation:  Normal sinus rhythm Nonspecific ST abnormality  Abnormal ECG No old tracing to compare Confirmed by [facility]  MD, Nunzio Cory  [phone]) on 10/13/2013 9:09:52 PM        Francine Graven, DO 10/19/13 1644

## 2013-10-28 ENCOUNTER — Ambulatory Visit
Admission: RE | Admit: 2013-10-28 | Discharge: 2013-10-28 | Disposition: A | Discharge status: Home / Self Care | Payer: BC Managed Care – PPO | Source: Ambulatory Visit

## 2013-10-28 DIAGNOSIS — Z1231 Encounter for screening mammogram for malignant neoplasm of breast: Secondary | ICD-10-CM

## 2013-11-16 ENCOUNTER — Encounter: Payer: Self-pay | Admitting: Nurse Practitioner

## 2013-11-16 ENCOUNTER — Ambulatory Visit (INDEPENDENT_AMBULATORY_CARE_PROVIDER_SITE_OTHER): Payer: BC Managed Care – PPO | Admitting: Nurse Practitioner

## 2013-11-16 VITALS — BP 116/80 | HR 92 | Ht 63.0 in | Wt 230.0 lb

## 2013-11-16 DIAGNOSIS — D251 Intramural leiomyoma of uterus: Secondary | ICD-10-CM

## 2013-11-16 DIAGNOSIS — I10 Essential (primary) hypertension: Secondary | ICD-10-CM

## 2013-11-16 DIAGNOSIS — Z01419 Encounter for gynecological examination (general) (routine) without abnormal findings: Secondary | ICD-10-CM

## 2013-11-16 NOTE — Patient Instructions (Signed)

## 2013-11-16 NOTE — Progress Notes (Signed)
Patient ID: Belinda Petersen, female   DOB: Feb 26, 1965, 48 y.o.   MRN: [id number] 48 y.o. G1P1 Married African American Fe here for annual exam.  She had hysteroscopic fibroid resection 06/08/12.  Since surgery menses is 3 days. Heavy X 1 day, moderate X 2 days. No clots. Some cramps 1-2 days relief with OTC NSAID's.  Hospital last month for elevated BP and now on 2 med's, monitors BP at  Home. stress test was normal.  Patient's last menstrual period was 11/01/2013.          Sexually active: Yes.    The current method of family planning is none.    Exercising: Yes.    Home exercise routine includes walking and ab crunches. Smoker:  no  Health Maintenance: Pap:  04/14/12, WNL, neg HR HPV MMG:  10/28/13, Bi-Rads 1:  Negative TDaP:  UTD in [place] within the last 7-8 years ? Labs: 10/2013 in EPIC   reports that she has never smoked. She has never used smokeless tobacco. She reports that she drinks alcohol. She reports that she does not use illicit drugs.  Past Medical History  Diagnosis Date  . Iron deficiency anemia secondary to blood loss (chronic)   . Menorrhagia   . Fibroids   . SVD (spontaneous vaginal delivery)     x 1  . HTN (hypertension)     no meds  . LPFXTKWI(097.3)     Past Surgical History  Procedure Laterality Date  . Wisdom tooth extraction    . Dilitation & currettage/hystroscopy with versapoint resection N/A 06/08/2012    Procedure: DILATATION & CURETTAGE/HYSTEROSCOPY WITH VERSAPOINT RESECTION;  Surgeon: Lyman Speller, MD;  Location: [facility];  Service: Gynecology;  Laterality: N/A;    Current Outpatient Prescriptions  Medication Sig Dispense Refill  . amLODipine (NORVASC) 10 MG tablet Take 1 tablet by mouth daily.      Marland Kitchen olmesartan-hydrochlorothiazide (BENICAR HCT) 20-12.5 MG per tablet Take 1 tablet by mouth daily.       No current facility-administered medications for this visit.    Family History  Problem Relation Age of Onset  . Diabetes Father   .  Hypertension Father   . Hypertension Mother   . Heart failure Mother   . Hypertension Brother   . Hypertension Brother   . Hypertension Brother   . Hypertension Sister   . Heart disease Brother   . Heart attack Brother   . Breast cancer Paternal Grandmother   . Heart disease Maternal Grandmother     pacemaker  . Hypertension Maternal Grandmother   . Lung cancer Paternal Grandfather     ROS:  Pertinent items are noted in HPI.  Otherwise, a comprehensive ROS was negative.  Exam:   BP 116/80  Pulse 92  Ht 5\' 3"  (1.6 m)  Wt 230 lb (104.327 kg)  BMI 40.75 kg/m2  LMP 11/01/2013 Height: 5\' 3"  (160 cm)  Ht Readings from Last 3 Encounters:  11/16/13 5\' 3"  (1.6 m)  10/13/13 5\' 3"  (1.6 m)  09/22/12 5\' 4"  (1.626 m)    General appearance: alert, cooperative and appears stated age Head: Normocephalic, without obvious abnormality, atraumatic Neck: no adenopathy, supple, symmetrical, trachea midline and thyroid normal to inspection and palpation Lungs: clear to auscultation bilaterally Breasts: normal appearance, no masses or tenderness Heart: regular rate and rhythm Abdomen: soft, non-tender; no masses,  no organomegaly Extremities: extremities normal, atraumatic, no cyanosis or edema Skin: Skin color, texture, turgor normal. No rashes or lesions Lymph nodes: Cervical, supraclavicular,  and axillary nodes normal. No abnormal inguinal nodes palpated Neurologic: Grossly normal   Pelvic: External genitalia:  no lesions              Urethra:  normal appearing urethra with no masses, tenderness or lesions              Bartholin's and Skene's: normal                 Vagina: normal appearing vagina with normal color and discharge, no lesions              Cervix: anteverted              Pap taken: No. Bimanual Exam:  Uterus:  normal size, contour, position, consistency, mobility, non-tender              Adnexa: no mass, fullness, tenderness               Rectovaginal: Confirms                Anus:  normal sphincter tone, no lesions  A:  Well Woman with normal exam  History of Menorrhagia, Uterine fibroids including submucosal fibroid, endometria polyp, anemia  S/P H' resection of fibroid 06/08/12  HTN  Update immunization   P:   Reviewed health and wellness pertinent to exam  Pap smear not taken today  Mammogram is due 10/2014  Update TDaP today  Counseled on breast self exam, mammography screening, adequate intake of calcium and vitamin D, diet and exercise, Kegel's exercises return annually or prn  An After Visit Summary was printed and given to the patient.

## 2013-11-21 NOTE — Progress Notes (Signed)
Encounter reviewed by Dr. Trishia Cuthrell Silva.  

## 2013-12-13 ENCOUNTER — Encounter: Payer: Self-pay | Admitting: Nurse Practitioner

## 2014-05-02 ENCOUNTER — Emergency Department (HOSPITAL_COMMUNITY): Payer: 59

## 2014-05-02 ENCOUNTER — Emergency Department (HOSPITAL_COMMUNITY)
Admission: EM | Admit: 2014-05-02 | Discharge: 2014-05-02 | Disposition: A | Discharge status: Home / Self Care | Payer: 59 | Attending: Emergency Medicine | Admitting: Emergency Medicine

## 2014-05-02 ENCOUNTER — Encounter (HOSPITAL_COMMUNITY): Payer: Self-pay | Admitting: Emergency Medicine

## 2014-05-02 DIAGNOSIS — Z862 Personal history of diseases of the blood and blood-forming organs and certain disorders involving the immune mechanism: Secondary | ICD-10-CM | POA: Insufficient documentation

## 2014-05-02 DIAGNOSIS — Z79899 Other long term (current) drug therapy: Secondary | ICD-10-CM | POA: Insufficient documentation

## 2014-05-02 DIAGNOSIS — I10 Essential (primary) hypertension: Secondary | ICD-10-CM | POA: Diagnosis not present

## 2014-05-02 DIAGNOSIS — Z8669 Personal history of other diseases of the nervous system and sense organs: Secondary | ICD-10-CM | POA: Diagnosis not present

## 2014-05-02 DIAGNOSIS — Z8742 Personal history of other diseases of the female genital tract: Secondary | ICD-10-CM | POA: Insufficient documentation

## 2014-05-02 DIAGNOSIS — R079 Chest pain, unspecified: Secondary | ICD-10-CM | POA: Insufficient documentation

## 2014-05-02 LAB — BASIC METABOLIC PANEL
ANION GAP: 3 — AB (ref 5–15)
BUN: 12 mg/dL (ref 6–23)
CHLORIDE: 102 mmol/L (ref 96–112)
CO2: 31 mmol/L (ref 19–32)
Calcium: 8.9 mg/dL (ref 8.4–10.5)
Creatinine, Ser: 0.99 mg/dL (ref 0.50–1.10)
GFR calc Af Amer: 77 mL/min — ABNORMAL LOW (ref >90)
GFR calc non Af Amer: 67 mL/min — ABNORMAL LOW (ref >90)
Glucose, Bld: 88 mg/dL (ref 70–99)
POTASSIUM: 3.6 mmol/L (ref 3.5–5.1)
SODIUM: 136 mmol/L (ref 135–145)

## 2014-05-02 LAB — CBC
HCT: 32.1 % — ABNORMAL LOW (ref 36.0–46.0)
HEMOGLOBIN: 10.4 g/dL — AB (ref 12.0–15.0)
MCH: 25.9 pg — AB (ref 26.0–34.0)
MCHC: 32.4 g/dL (ref 30.0–36.0)
MCV: 80 fL (ref 78.0–100.0)
Platelets: 261 10*3/uL (ref 150–400)
RBC: 4.01 MIL/uL (ref 3.87–5.11)
RDW: 15.1 % (ref 11.5–15.5)
WBC: 4.3 10*3/uL (ref 4.0–10.5)

## 2014-05-02 LAB — I-STAT TROPONIN, ED
TROPONIN I, POC: 0 ng/mL (ref 0.00–0.08)
Troponin i, poc: 0 ng/mL (ref 0.00–0.08)

## 2014-05-02 LAB — POC URINE PREG, ED: Preg Test, Ur: NEGATIVE

## 2014-05-02 NOTE — ED Notes (Signed)
MD at bedside. 

## 2014-05-02 NOTE — ED Provider Notes (Signed)
CSN: [id number]     Arrival date & time 05/02/14  1404 History   First MD Initiated Contact with Patient 05/02/14 1613     Chief Complaint  Patient presents with  . Chest Pain     Patient is a 49 y.o. female presenting with chest pain. The history is provided by the patient. No language interpreter was used.  Chest Pain  Belinda Petersen presents for the evaluation of chest pain. She states that she was at work at 1:30 today when she developed some left sided chest pain. She was at rest at work but her job is stressful. Pain is described as a tight sensation that is waxing and waning. There are no clear alleviating or worsening factors. Pain is currently gone. She has a history of high blood pressure and a family history of heart disease: her mother was diagnosed with heart disease in her 57s. She denies any shortness of breath, nausea, vomiting, abdominal pain, diaphoresis. She has chronic intermittent peripheral edema, which is stable for her.  Past Medical History  Diagnosis Date  . Iron deficiency anemia secondary to blood loss (chronic)   . Menorrhagia   . Fibroids   . SVD (spontaneous vaginal delivery)     x 1  . HTN (hypertension)     no meds  . KDXIPJAS(505.3)    Past Surgical History  Procedure Laterality Date  . Wisdom tooth extraction    . Dilitation & currettage/hystroscopy with versapoint resection N/A 06/08/2012    Procedure: DILATATION & CURETTAGE/HYSTEROSCOPY WITH VERSAPOINT RESECTION;  Surgeon: Lyman Speller, MD;  Location: [facility];  Service: Gynecology;  Laterality: N/A;   Family History  Problem Relation Age of Onset  . Diabetes Father   . Hypertension Father   . Hypertension Mother   . Heart failure Mother   . Hypertension Brother   . Hypertension Brother   . Hypertension Brother   . Hypertension Sister   . Heart disease Brother   . Heart attack Brother   . Breast cancer Paternal Grandmother   . Heart disease Maternal Grandmother     pacemaker  .  Hypertension Maternal Grandmother   . Lung cancer Paternal Grandfather    History  Substance Use Topics  . Smoking status: Never Smoker   . Smokeless tobacco: Never Used  . Alcohol Use: Yes     Comment: socially   OB History    Gravida Para Term Preterm AB TAB SAB Ectopic Multiple Living   1 1        1      Review of Systems  Cardiovascular: Positive for chest pain.  All other systems reviewed and are negative.     Allergies  Review of patient's allergies indicates no known allergies.  Home Medications   Prior to Admission medications   Medication Sig Start Date End Date Taking? Authorizing Provider  amLODipine (NORVASC) 10 MG tablet Take 1 tablet by mouth daily. 10/28/13   Historical Provider, MD  olmesartan-hydrochlorothiazide (BENICAR HCT) 20-12.5 MG per tablet Take 1 tablet by mouth daily.    Historical Provider, MD   BP 161/109 mmHg  Pulse 76  Temp(Src) 97.8 F (36.6 C) (Oral)  Resp 18  SpO2 100%  LMP 04/18/2014 Physical Exam  Constitutional: She is oriented to person, place, and time. She appears well-developed and well-nourished.  HENT:  Head: Normocephalic and atraumatic.  Cardiovascular: Normal rate and regular rhythm.   No murmur heard. Pulmonary/Chest: Effort normal and breath sounds normal. No respiratory distress.  Abdominal: Soft. There is no tenderness. There is no rebound and no guarding.  Musculoskeletal: She exhibits no tenderness.  1+ pitting edema in BLE  Neurological: She is alert and oriented to person, place, and time.  Skin: Skin is warm and dry.  Psychiatric: She has a normal mood and affect. Her behavior is normal.  Nursing note and vitals reviewed.   ED Course  Procedures (including critical care time) Labs Review Labs Reviewed  CBC - Abnormal; Notable for the following:    Hemoglobin 10.4 (*)    HCT 32.1 (*)    MCH 25.9 (*)    All other components within normal limits  BASIC METABOLIC PANEL - Abnormal; Notable for the  following:    GFR calc non Af Amer 67 (*)    GFR calc Af Amer 77 (*)    Anion gap 3 (*)    All other components within normal limits  I-STAT TROPOININ, ED  POC URINE PREG, ED  Belinda Petersen, ED    Imaging Review Dg Chest 2 View  05/02/2014   CLINICAL DATA:  Left-sided chest pain and difficulty breathing for approximately 2 hours  EXAM: CHEST  2 VIEW  COMPARISON:  October 14, 2013  FINDINGS: Lungs are clear. Heart size and pulmonary vascularity are normal. No adenopathy no pneumothorax. No bone lesions.  IMPRESSION: No edema or consolidation.   Electronically Signed   By: Lowella Grip III M.D.   On: 05/02/2014 14:54     EKG Interpretation   Date/Time:  Monday May 02 2014 14:13:47 EDT Ventricular Rate:  68 PR Interval:  143 QRS Duration: 98 QT Interval:  426 QTC Calculation: 453 R Axis:   13 Text Interpretation:  Sinus rhythm Nonspecific ST abnormality RSR' in V1  or V2, right VCD or RVH Baseline wander in lead(s) I Confirmed by Hazle Coca [id number]) on 05/02/2014 4:13:48 PM      MDM   Final diagnoses:  Chest pain, unspecified chest pain type    Patient here for evaluation of chest pain. Patient is low risk for cardiac disease and her risk factors are high blood pressure and family history. Patient is pain-free in the emergency department. There are nonspecific EKG changes. She has 2 negative troponins. Patient would prefer outpatient follow-up for stress testing. Discussed with cardiology regarding appointment for later this week for outpatient stress testing. Discussed with patient homecare very close return precautions. Clinical picture is not consistent with acute coronary syndrome, PE, CHF, dissection.    Quintella Reichert, MD 05/02/14 [phone]

## 2014-05-02 NOTE — Discharge Instructions (Signed)

## 2014-05-02 NOTE — ED Notes (Signed)
Pt states that she was at work typing on her computer when she started having left sided chest pain.  Pt denies any radiation or anything that makes it better or worse.  Pt states that she woke up this morning hoarse.  Pt denies any shob, n/v. Pt does have HTN.

## 2014-05-04 ENCOUNTER — Encounter: Payer: Self-pay | Admitting: Cardiology

## 2014-05-04 ENCOUNTER — Ambulatory Visit (INDEPENDENT_AMBULATORY_CARE_PROVIDER_SITE_OTHER): Payer: 59 | Admitting: Cardiology

## 2014-05-04 VITALS — BP 142/110 | HR 60 | Ht 63.0 in | Wt 230.8 lb

## 2014-05-04 DIAGNOSIS — R079 Chest pain, unspecified: Secondary | ICD-10-CM | POA: Diagnosis not present

## 2014-05-04 DIAGNOSIS — I10 Essential (primary) hypertension: Secondary | ICD-10-CM | POA: Diagnosis not present

## 2014-05-04 DIAGNOSIS — R600 Localized edema: Secondary | ICD-10-CM

## 2014-05-04 DIAGNOSIS — R0609 Other forms of dyspnea: Secondary | ICD-10-CM | POA: Diagnosis not present

## 2014-05-04 MED ORDER — OLMESARTAN MEDOXOMIL 40 MG PO TABS: 40.0000 mg | ORAL_TABLET | Freq: Every day | ORAL | Status: DC

## 2014-05-04 MED ORDER — FUROSEMIDE 40 MG PO TABS: 40.0000 mg | ORAL_TABLET | Freq: Every day | ORAL | Status: DC

## 2014-05-04 NOTE — Patient Instructions (Addendum)
Your physician has recommended you make the following change in your medication:   STOP TAKING PROPANOLOL NOW  STOP TAKING BENICAR HCT NOW  STOP TAKING AZILSARTAN-CHLORTHALIDONE NOW  START TAKING BENICAR (OLMESARTAN) 40 MG DAILY  START TAKING LASIX 40 MG DAILY    Your physician recommends that you return for lab work in: [place] A BMET AND BNP ---PLEASE SCHEDULE THIS PRIOR TO YOUR 3 WEEK FOLLOW-UP WITH DR Meda Coffee     Your physician recommends that you schedule a follow-up appointment in: 3 [place] DR NELSON---PLEASE HAVE LABS DONE PRIOR TO THIS VISIT

## 2014-05-04 NOTE — Progress Notes (Signed)
Patient ID: Belinda Petersen, female   DOB: 01-Nov-1965, 49 y.o.   MRN: [id number]      Cardiology Office Note   Date:  05/04/2014   ID:  Aleeta Schmaltz, DOB 07/31/1965, MRN [id number]  PCP:  Foye Spurling, MD  Cardiologist:  Dorothy Spark, MD   Chief complaint: Chest pain    History of Present Illness: Belinda Petersen is a 49 y.o. female who presents for evaluation of chest pain. The patient is a 49 year old female with prior medical history of obesity and hypertension who was evaluated in 2 occasions for chest pain. They both happened at rest while she was under significant amount of stress when she developed left-sided chest pain that felt like throbbing and pressure and was not associated with dizziness or shortness of breath. She went to the ER in September 2015 when Ubaldo Glassing can nuclear stress test was performed and showed no prior scar or ischemia. And normal LVEF. Echocardiogram was also performed and showed grade 1 diastolic dysfunction and normal LVEF. The patient went to the ED are again on 05/02/2014 troponins were negative and echo showed sinus rhythm and nonspecific ST T-wave abnormalities. Patient states that she has been under significant amount of stress in the last year, she has her stressful job working for [employer] dealing with complaints and her father just passed away. She doesn't exercise right now but in the past she used to do and enjoyed it. She has been having uncontrolled high blood pressure, she used to be on Benicar and was recently increased to 40/25 mg hydrochlorothiazide,  Her primary care physician changed that to propranolol and a combination of different ARB and chlorthalidone that set doesn't seem to control her blood pressure better.   Past Medical History  Diagnosis Date  . Iron deficiency anemia secondary to blood loss (chronic)   . Menorrhagia   . Fibroids   . SVD (spontaneous vaginal delivery)     x 1  . HTN (hypertension)     no  meds  . [id number](338.2)     Past Surgical History  Procedure Laterality Date  . Wisdom tooth extraction    . Dilitation & currettage/hystroscopy with versapoint resection N/A 06/08/2012    Procedure: DILATATION & CURETTAGE/HYSTEROSCOPY WITH VERSAPOINT RESECTION;  Surgeon: Lyman Speller, MD;  Location: [facility];  Service: Gynecology;  Laterality: N/A;     Current Outpatient Prescriptions  Medication Sig Dispense Refill  . BENICAR HCT 40-12.5 MG per tablet Take 1 tablet by mouth daily.    Marland Kitchen olmesartan-hydrochlorothiazide (BENICAR HCT) 20-12.5 MG per tablet Take 1 tablet by mouth daily.     No current facility-administered medications for this visit.    Allergies:   Review of patient's allergies indicates no known allergies.    Social History:  The patient  reports that she has never smoked. She has never used smokeless tobacco. She reports that she drinks alcohol. She reports that she does not use illicit drugs.   Family History:  The patient's family history includes Breast cancer in her paternal grandmother; Diabetes in her father; Heart attack in her brother; Heart disease in her brother and maternal grandmother; Heart failure in her mother; Hypertension in her brother, brother, brother, father, maternal grandmother, mother, and sister; Lung cancer in her paternal grandfather.    ROS:  Please see the history of present illness.  All other systems are reviewed and negative.    PHYSICAL EXAM: VS:  LMP 04/18/2014 , BMI There is no weight on file  to calculate BMI. GEN: Well nourished, well developed, in no acute distress HEENT: normal Neck: no JVD, carotid bruits, or masses Cardiac: RRR; no murmurs, rubs, or gallops,no edema  Respiratory:  clear to auscultation bilaterally, normal work of breathing GI: soft, nontender, nondistended, + BS MS: no deformity or atrophy Skin: warm and dry, no rash Neuro:  Strength and sensation are intact Psych: euthymic mood, full  affect   EKG:  EKG is not ordered today. ECG: on 05/02/2014: SR, non-specific ST-T wave abnormalities.  Recent Labs: 10/14/2013: ALT 11 05/02/2014: BUN 12; Creatinine 0.99; Hemoglobin 10.4*; Platelets 261; Potassium 3.6; Sodium 136    Lipid Panel No results found for: CHOL, TRIG, HDL, CHOLHDL, VLDL, LDLCALC, LDLDIRECT    Wt Readings from Last 3 Encounters:  11/16/13 230 lb (104.327 kg)  10/13/13 232 lb 1.6 oz (105.28 kg)  09/22/12 228 lb (103.42 kg)    TTE: 10/14/2013 Left ventricle: The cavity size was normal. Wall thickness was normal. Systolic function was normal. The estimated ejection fraction was in the range of 60% to 65%. Wall motion was normal; there were no regional wall motion abnormalities. Doppler parameters are consistent with abnormal left ventricular relaxation (grade 1 diastolic dysfunction).   Other studies Reviewed: Additional studies/ records that were reviewed today include: TTE, Lexiscan nuclear stress test.. Review of the above records demonstrates: As described in history of present illness   ASSESSMENT AND PLAN:  1.  Atypical chest pain - with 2 visits to the ER, normal EKG, normal enzymes, no regional wall motion abnormalities on the echocardiogram with preserved LVEF, and normal stress test in September 2015. For now the patient is advised to increase her exercise level, the focus will be on controlling her blood pressure and if she continues to have exertional chest pain we will refer her to cardiac cath.  2. Hypertension - with grade 1 diastolic dysfunction on echocardiogram, we'll simplify her regimen and discontinue hydrochlorothiazide just continue Benicar 40 mg daily and start Lasix 40 mg daily if she complains of lower edema.  3. Lower extremity edema - start Lasix 40 mg daily.  Current medicines are reviewed at length with the patient today.  The patient has concerns regarding medicines.  Labs/ tests ordered today include: none No  orders of the defined types were placed in this encounter.     Disposition:   FU with Dorothy Spark, MD  in 3 weeks   Signed, Dorothy Spark, MD  05/04/2014 11:24 AM    [facility] [address], [place], [place]  [address] Phone: [phone]; Fax: [phone]

## 2014-06-06 ENCOUNTER — Encounter: Payer: Self-pay | Admitting: *Deleted

## 2014-06-07 ENCOUNTER — Other Ambulatory Visit: Payer: 59

## 2014-06-09 ENCOUNTER — Ambulatory Visit: Payer: 59 | Admitting: Cardiology

## 2014-06-23 ENCOUNTER — Encounter: Payer: Self-pay | Admitting: Cardiology

## 2014-12-30 ENCOUNTER — Encounter: Payer: Self-pay | Admitting: Obstetrics & Gynecology

## 2014-12-30 ENCOUNTER — Telehealth: Payer: Self-pay

## 2014-12-30 ENCOUNTER — Ambulatory Visit (INDEPENDENT_AMBULATORY_CARE_PROVIDER_SITE_OTHER): Payer: Self-pay | Admitting: Obstetrics & Gynecology

## 2014-12-30 VITALS — BP 158/100 | HR 72 | Resp 16 | Ht 63.0 in | Wt 224.0 lb

## 2014-12-30 DIAGNOSIS — Z124 Encounter for screening for malignant neoplasm of cervix: Secondary | ICD-10-CM

## 2014-12-30 DIAGNOSIS — Z01419 Encounter for gynecological examination (general) (routine) without abnormal findings: Secondary | ICD-10-CM

## 2014-12-30 NOTE — Telephone Encounter (Signed)
Spoke with [place] rx for [employer] for 30 days is $274. Call to [place] office spoke with front office staff who state that they do have samples of Benicar in the office. Dr.Clark is out of the office today and since the patient has not been seen since March it needs to be verified with Dr.Clark if samples can be given or if she needs to be seen in the office for samples or alternative medication. Front desk recommends that the patient call the office on Monday 11/21 so they may speak with Dr.Clark and see what needs to be done for the patient. Patient is agreeable and will call first thing Monday morning. Call to the [place] 5 day supply of medication will be 42.72. Patient states she is was taking Benicar [employer] 20-12.5 prior to stopping this medication. Savings card provided to patient. Advised I will speak with Dr.Miller about rx for the weekend and call to let her know of recommendations. Patient is agreeable.

## 2014-12-30 NOTE — Telephone Encounter (Signed)
Spoke with patient. Advised I spoke with Dr.Miller who recommends waiting for follow up and advise from Dr. Carlis Abbott prior to starting the Benicar again. Do not want her to be one the medication for only a few days. Would need to be at least two weeks. Patient is aware she will need to contact [employer] office first thing Monday.  Dr.Miller, anything further for this patient?

## 2014-12-30 NOTE — Progress Notes (Signed)
49 y.o. G1P1 MarriedAfrican AmericanF here for annual exam.  Doing well.  Pt reports her cycles are regular and the flow is heavy for two days but then tapers off.  Bleeding is better than in the past.   PCP:  Dr. Jeanann Lewandowsky.  Patient's last menstrual period was 12/24/2014.          Sexually active: Yes.    The current method of family planning is none.    Exercising: No.  not regularly Smoker:  no  Health Maintenance: Pap:  04/14/12 WNL/negative HR HPV History of abnormal Pap:  Abnormal in [place] 7-8 years ago.  Repeat was negative. MMG:  10/28/13-BiRads 1-negative Colonoscopy:  none BMD:   none TDaP:  UTD in [place] within the lat 10 years Screening Labs: PCP, Hb today: PCP, Urine today: PCP   reports that she has never smoked. She has never used smokeless tobacco. She reports that she drinks alcohol. She reports that she does not use illicit drugs.  Past Medical History  Diagnosis Date  . Iron deficiency anemia secondary to blood loss (chronic)   . Menorrhagia   . Fibroids   . SVD (spontaneous vaginal delivery)     x 1  . HTN (hypertension)     no meds  . [id number]     Past Surgical History  Procedure Laterality Date  . Wisdom tooth extraction    . Dilitation & currettage/hystroscopy with versapoint resection N/A 06/08/2012    Procedure: DILATATION & CURETTAGE/HYSTEROSCOPY WITH VERSAPOINT RESECTION;  Surgeon: Lyman Speller, MD;  Location: [facility];  Service: Gynecology;  Laterality: N/A;    Current Outpatient Prescriptions  Medication Sig Dispense Refill  . furosemide (LASIX) 40 MG tablet Take 1 tablet (40 mg total) by mouth daily. 90 tablet 3  . olmesartan (BENICAR) 40 MG tablet Take 1 tablet (40 mg total) by mouth daily. (Patient not taking: Reported on 12/30/2014) 90 tablet 3   No current facility-administered medications for this visit.    Family History  Problem Relation Age of Onset  . Diabetes Father   . Hypertension Father   . Hypertension Mother    . Congestive Heart Failure Mother   . Hypertension Brother   . Hypertension Brother   . Hypertension Brother   . Hypertension Sister   . Heart disease Brother   . Heart attack Brother   . Breast cancer Paternal Grandmother   . Heart disease Maternal Grandmother     pacemaker  . Hypertension Maternal Grandmother   . Lung cancer Paternal Grandfather     ROS:  Pertinent items are noted in HPI.  Otherwise, a comprehensive ROS was negative.  Exam:   BP 158/100 mmHg  Pulse 72  Resp 16  Ht 5\' 3"  (1.6 m)  Wt 224 lb (101.606 kg)  BMI 39.69 kg/m2  LMP 12/24/2014  Weight change: -4# Height: 5\' 3"  (160 cm)  Ht Readings from Last 3 Encounters:  12/30/14 5\' 3"  (1.6 m)  05/04/14 5\' 3"  (1.6 m)  11/16/13 5\' 3"  (1.6 m)   General appearance: alert, cooperative and appears stated age Head: Normocephalic, without obvious abnormality, atraumatic Neck: no adenopathy, supple, symmetrical, trachea midline and thyroid normal to inspection and palpation Lungs: clear to auscultation bilaterally Breasts: normal appearance, no masses or tenderness Heart: regular rate and rhythm Abdomen: soft, non-tender; bowel sounds normal; no masses,  no organomegaly Extremities: extremities normal, atraumatic, no cyanosis or edema Skin: Skin color, texture, turgor normal. No rashes or lesions Lymph nodes: Cervical,  supraclavicular, and axillary nodes normal. No abnormal inguinal nodes palpated Neurologic: Grossly normal   Pelvic: External genitalia:  no lesions              Urethra:  normal appearing urethra with no masses, tenderness or lesions              Bartholins and Skenes: normal                 Vagina: normal appearing vagina with normal color and discharge, no lesions              Cervix: no lesions              Pap taken: Yes.   Bimanual Exam:  Uterus:  normal size, contour, position, consistency, mobility, non-tender              Adnexa: normal adnexa and no mass, fullness, tenderness                Rectovaginal: Confirms               Anus:  normal sphincter tone, no lesions  Chaperone was present for exam.  A:  Well Woman with normal exam Family hx of breast cancer in maternal GM, in her 72's, and maternal aunt, 60's Hypertension with left diastolic dysfunction.  Off Benicar due to cost Fibroid uterus H/O anemia  P:   Mammogram screening discussed.  Pt will consider 3D due to Grade D breast density pap smear obtained today.  Neg HR HPV 3/14.   Labs/vaccine with PCP Will work on figuring out substitute for [employer] or way to get it cheaper for pt. return annually or prn

## 2014-12-30 NOTE — Telephone Encounter (Signed)
Thank you.  Nothing further.  Encounter closed.

## 2015-01-02 LAB — IPS PAP TEST WITH REFLEX TO HPV

## 2015-01-07 ENCOUNTER — Emergency Department (INDEPENDENT_AMBULATORY_CARE_PROVIDER_SITE_OTHER)
Admission: EM | Admit: 2015-01-07 | Discharge: 2015-01-07 | Disposition: A | Discharge status: Home / Self Care | Payer: Self-pay | Source: Home / Self Care

## 2015-01-07 ENCOUNTER — Encounter (HOSPITAL_COMMUNITY): Payer: Self-pay | Admitting: *Deleted

## 2015-01-07 DIAGNOSIS — J40 Bronchitis, not specified as acute or chronic: Secondary | ICD-10-CM

## 2015-01-07 MED ORDER — AZITHROMYCIN 250 MG PO TABS: 250.0000 mg | ORAL_TABLET | Freq: Every day | ORAL | Status: DC

## 2015-01-07 MED ORDER — IPRATROPIUM-ALBUTEROL 0.5-2.5 (3) MG/3ML IN SOLN
3.0000 mL | Freq: Once | RESPIRATORY_TRACT | Status: AC
  Administered 2015-01-07: 3 mL via RESPIRATORY_TRACT

## 2015-01-07 MED ORDER — HYDROCOD POLST-CPM POLST ER 10-8 MG/5ML PO SUER: 5.0000 mL | Freq: Every evening | ORAL | Status: DC | PRN

## 2015-01-07 MED ORDER — ALBUTEROL SULFATE (2.5 MG/3ML) 0.083% IN NEBU
INHALATION_SOLUTION | RESPIRATORY_TRACT | Status: AC
  Filled 2015-01-07: qty 3

## 2015-01-07 MED ORDER — ALBUTEROL SULFATE HFA 108 (90 BASE) MCG/ACT IN AERS: 2.0000 | INHALATION_SPRAY | RESPIRATORY_TRACT | Status: DC | PRN

## 2015-01-07 MED ORDER — PREDNISONE 50 MG PO TABS: ORAL_TABLET | ORAL | Status: DC

## 2015-01-07 MED ORDER — ALBUTEROL SULFATE (2.5 MG/3ML) 0.083% IN NEBU
5.0000 mg | INHALATION_SOLUTION | Freq: Once | RESPIRATORY_TRACT | Status: AC
  Administered 2015-01-07: 5 mg via RESPIRATORY_TRACT

## 2015-01-07 MED ORDER — IPRATROPIUM-ALBUTEROL 0.5-2.5 (3) MG/3ML IN SOLN
RESPIRATORY_TRACT | Status: AC
  Filled 2015-01-07: qty 3

## 2015-01-07 NOTE — ED Provider Notes (Signed)
CSN: [id number]     Arrival date & time 01/07/15  1647 History   None    Chief Complaint  Patient presents with  . Cough   (Consider location/radiation/quality/duration/timing/severity/associated sxs/prior Treatment) HPI  History obtained from patient:   LOCATION:upper resp SEVERITY: DURATION:2 weeks CONTEXT:sudden onset QUALITY: dry non productive MODIFYING FACTORS: OTC no relief ASSOCIATED SYMPTOMS: wheezing TIMING:constant OCCUPATION:instructor  Past Medical History  Diagnosis Date  . Iron deficiency anemia secondary to blood loss (chronic)   . Menorrhagia   . Fibroids   . SVD (spontaneous vaginal delivery)     x 1  . HTN (hypertension)     no meds  . [id number]    Past Surgical History  Procedure Laterality Date  . Wisdom tooth extraction    . Dilitation & currettage/hystroscopy with versapoint resection N/A 06/08/2012    Procedure: DILATATION & CURETTAGE/HYSTEROSCOPY WITH VERSAPOINT RESECTION;  Surgeon: Lyman Speller, MD;  Location: [facility];  Service: Gynecology;  Laterality: N/A;   Family History  Problem Relation Age of Onset  . Diabetes Father   . Hypertension Father   . Hypertension Mother   . Congestive Heart Failure Mother   . Hypertension Brother   . Hypertension Brother   . Hypertension Brother   . Hypertension Sister   . Heart disease Brother   . Heart attack Brother   . Breast cancer Paternal Grandmother   . Heart disease Maternal Grandmother     pacemaker  . Hypertension Maternal Grandmother   . Lung cancer Paternal Grandfather   . Lung cancer Father    Social History  Substance Use Topics  . Smoking status: Never Smoker   . Smokeless tobacco: Never Used  . Alcohol Use: 0.0 oz/week    0 Standard drinks or equivalent per week     Comment: 1 glass twice a month   OB History    Gravida Para Term Preterm AB TAB SAB Ectopic Multiple Living   1 1        1      Review of Systems ROS +'ve cough  DENIES; CHANGE IN ACTIVITY,  CONGESTION, HEADACHE, CHEST PAIN, ABDOMINAL PAIN, SHORTNESS OF BREATH, WHEEZING, EXCESSIVE THIRST OR URINATION, SKIN RASH, DIFFICULTY WITH URINATION, DYSURIA, AGITATION, BALANCE ISSUES  Allergies  Hctz  Home Medications   Prior to Admission medications   Medication Sig Start Date End Date Taking? Authorizing Provider  furosemide (LASIX) 40 MG tablet Take 1 tablet (40 mg total) by mouth daily. 05/04/14   Dorothy Spark, MD  olmesartan (BENICAR) 40 MG tablet Take 1 tablet (40 mg total) by mouth daily. Patient not taking: Reported on 12/30/2014 05/04/14   Dorothy Spark, MD   Meds Ordered and Administered this Visit  Medications - No data to display  BP 169/112 mmHg  Pulse 96  Temp(Src) 98.3 F (36.8 C) (Oral)  Resp 22  SpO2 98%  LMP 12/24/2014 No data found.   Physical Exam  Constitutional: She is oriented to person, place, and time. She appears well-developed.  HENT:  Head: Normocephalic and atraumatic.  Neck: Normal range of motion. Neck supple.  Cardiovascular: Normal rate, regular rhythm and normal heart sounds.   Pulmonary/Chest: Effort normal and breath sounds normal.  Musculoskeletal: Normal range of motion.  Neurological: She is alert and oriented to person, place, and time.  Skin: Skin is warm and dry.  Psychiatric: She has a normal mood and affect. Her behavior is normal. Judgment and thought content normal.    ED Course  Procedures (  including critical care time)  Labs Review Labs Reviewed - No data to display  Imaging Review No results found.   Visual Acuity Review  Right Eye Distance:   Left Eye Distance:   Bilateral Distance:    Right Eye Near:   Left Eye Near:    Bilateral Near:         MDM   1. Bronchitis    Patient states that she is feeling much better after DuoNeb nebulizer. Breathing is easier test is less tight. Prescriptions for azithromycin, prednisone, Tussionex, albuterol per provided. Instructions of care provided. Her  swelling in stable condition. Encouraged to follow with Dr. Carlis Abbott. She is also encouraged to pick up her antihypertensive medicines that she did not take today because she was flying.    Konrad Felix, [place] 01/07/15 [phone]

## 2015-01-07 NOTE — ED Notes (Signed)
Pt  Reports  symptoms  Of  A  Mostly  Non  Productive  Cough        Cough     X  sev  Weeks  Pt reports    It  Hurts   When  She  Coughs     Pt  Did  Not  Take  Her bp  Med  This  Am

## 2015-01-07 NOTE — Discharge Instructions (Signed)
Cough, Adult A cough helps to clear your throat and lungs. A cough may last only 2-3 weeks (acute), or it may last longer than 8 weeks (chronic). Many different things can cause a cough. A cough may be a sign of an illness or another medical condition. HOME CARE  Pay attention to any changes in your cough.  Take medicines only as told by your doctor.  If you were prescribed an antibiotic medicine, take it as told by your doctor. Do not stop taking it even if you start to feel better.  Talk with your doctor before you try using a cough medicine.  Drink enough fluid to keep your pee (urine) clear or pale yellow.  If the air is dry, use a cold steam vaporizer or humidifier in your home.  Stay away from things that make you cough at work or at home.  If your cough is worse at night, try using extra pillows to raise your head up higher while you sleep.  Do not smoke, and try not to be around smoke. If you need help quitting, ask your doctor.  Do not have caffeine.  Do not drink alcohol.  Rest as needed. GET HELP IF:  You have new problems (symptoms).  You cough up yellow fluid (pus).  Your cough does not get better after 2-3 weeks, or your cough gets worse.  Medicine does not help your cough and you are not sleeping well.  You have pain that gets worse or pain that is not helped with medicine.  You have a fever.  You are losing weight and you do not know why.  You have night sweats. GET HELP RIGHT AWAY IF:  You cough up blood.  You have trouble breathing.  Your heartbeat is very fast.   This information is not intended to replace advice given to you by your health care provider. Make sure you discuss any questions you have with your health care provider.   Document Released: 10/11/2010 Document Revised: 10/19/2014 Document Reviewed: 04/06/2014 Elsevier Interactive Patient Education 2016 Else[employer] Acute Bronchitis Bronchitis is when the airways that extend from  the windpipe into the lungs get red, puffy, and painful (inflamed). Bronchitis often causes thick spit (mucus) to develop. This leads to a cough. A cough is the most common symptom of bronchitis. In acute bronchitis, the condition usually begins suddenly and goes away over time (usually in 2 weeks). Smoking, allergies, and asthma can make bronchitis worse. Repeated episodes of bronchitis may cause more lung problems. HOME CARE  Rest.  Drink enough fluids to keep your pee (urine) clear or pale yellow (unless you need to limit fluids as told by your doctor).  Only take over-the-counter or prescription medicines as told by your doctor.  Avoid smoking and secondhand smoke. These can make bronchitis worse. If you are a smoker, think about using nicotine gum or skin patches. Quitting smoking will help your lungs heal faster.  Reduce the chance of getting bronchitis again by:  Washing your hands often.  Avoiding people with cold symptoms.  Trying not to touch your hands to your mouth, nose, or eyes.  Follow up with your doctor as told. GET HELP IF: Your symptoms do not improve after 1 week of treatment. Symptoms include:  Cough.  Fever.  Coughing up thick spit.  Body aches.  Chest congestion.  Chills.  Shortness of breath.  Sore throat. GET HELP RIGHT AWAY IF:   You have an increased fever.  You have chills.  You have severe shortness of breath.  You have bloody thick spit (sputum).  You throw up (vomit) often.  You lose too much body fluid (dehydration).  You have a severe headache.  You faint. MAKE SURE YOU:   Understand these instructions.  Will watch your condition.  Will get help right away if you are not doing well or get worse.   This information is not intended to replace advice given to you by your health care provider. Make sure you discuss any questions you have with your health care provider.   Document Released: 07/17/2007 Document Revised:  09/30/2012 Document Reviewed: 07/21/2012 Elsevier Interactive Patient Education [employer].

## 2015-04-24 ENCOUNTER — Emergency Department (INDEPENDENT_AMBULATORY_CARE_PROVIDER_SITE_OTHER)
Admission: EM | Admit: 2015-04-24 | Discharge: 2015-04-24 | Disposition: A | Discharge status: Home / Self Care | Payer: Self-pay | Source: Home / Self Care | Attending: Emergency Medicine | Admitting: Emergency Medicine

## 2015-04-24 ENCOUNTER — Encounter (HOSPITAL_COMMUNITY): Payer: Self-pay | Admitting: Emergency Medicine

## 2015-04-24 DIAGNOSIS — J069 Acute upper respiratory infection, unspecified: Secondary | ICD-10-CM

## 2015-04-24 DIAGNOSIS — J9801 Acute bronchospasm: Secondary | ICD-10-CM

## 2015-04-24 DIAGNOSIS — I10 Essential (primary) hypertension: Secondary | ICD-10-CM

## 2015-04-24 MED ORDER — PREDNISONE 20 MG PO TABS: ORAL_TABLET | ORAL | Status: DC

## 2015-04-24 MED ORDER — IPRATROPIUM-ALBUTEROL 0.5-2.5 (3) MG/3ML IN SOLN
3.0000 mL | Freq: Once | RESPIRATORY_TRACT | Status: AC
  Administered 2015-04-24: 3 mL via RESPIRATORY_TRACT

## 2015-04-24 MED ORDER — SODIUM CHLORIDE 0.9 % IN NEBU
INHALATION_SOLUTION | RESPIRATORY_TRACT | Status: AC
  Filled 2015-04-24: qty 3

## 2015-04-24 MED ORDER — IPRATROPIUM-ALBUTEROL 0.5-2.5 (3) MG/3ML IN SOLN
RESPIRATORY_TRACT | Status: AC
  Filled 2015-04-24: qty 3

## 2015-04-24 NOTE — ED Notes (Signed)
Cold and flu symptoms that started 3/12

## 2015-04-24 NOTE — Discharge Instructions (Signed)
Bronchospasm, Adult Continue using her albuterol HFA 2 puffs every 4 hours as needed for cough and wheeze Start the prednisone taper dose today. Take with food. Recommend taking an antihistamine such as Zyrtec, Claritin or Allegra A bronchospasm is a spasm or tightening of the airways going into the lungs. During a bronchospasm breathing becomes more difficult because the airways get smaller. When this happens there can be coughing, a whistling sound when breathing (wheezing), and difficulty breathing. Bronchospasm is often associated with asthma, but not all patients who experience a bronchospasm have asthma. CAUSES  A bronchospasm is caused by inflammation or irritation of the airways. The inflammation or irritation may be triggered by:   Allergies (such as to animals, pollen, food, or mold). Allergens that cause bronchospasm may cause wheezing immediately after exposure or many hours later.   Infection. Viral infections are believed to be the most common cause of bronchospasm.   Exercise.   Irritants (such as pollution, cigarette smoke, strong odors, aerosol sprays, and paint fumes).   Weather changes. Winds increase molds and pollens in the air. Rain refreshes the air by washing irritants out. Cold air may cause inflammation.   Stress and emotional upset.  SIGNS AND SYMPTOMS   Wheezing.   Excessive nighttime coughing.   Frequent or severe coughing with a simple cold.   Chest tightness.   Shortness of breath.  DIAGNOSIS  Bronchospasm is usually diagnosed through a history and physical exam. Tests, such as chest X-rays, are sometimes done to look for other conditions. TREATMENT   Inhaled medicines can be given to open up your airways and help you breathe. The medicines can be given using either an inhaler or a nebulizer machine.  Corticosteroid medicines may be given for severe bronchospasm, usually when it is associated with asthma. HOME CARE INSTRUCTIONS   Always  have a plan prepared for seeking medical care. Know when to call your health care provider and local emergency services (911 in the [place] Know where you can access local emergency care.  Only take medicines as directed by your health care provider.  If you were prescribed an inhaler or nebulizer machine, ask your health care provider to explain how to use it correctly. Always use a spacer with your inhaler if you were given one.  It is necessary to remain calm during an attack. Try to relax and breathe more slowly.  Control your home environment in the following ways:   Change your heating and air conditioning filter at least once a month.   Limit your use of fireplaces and wood stoves.  Do not smoke and do not allow smoking in your home.   Avoid exposure to perfumes and fragrances.   Get rid of pests (such as roaches and mice) and their droppings.   Throw away plants if you see mold on them.   Keep your house clean and dust free.   Replace carpet with wood, tile, or vinyl flooring. Carpet can trap dander and dust.   Use allergy-proof pillows, mattress covers, and box spring covers.   Wash bed sheets and blankets every week in hot water and dry them in a dryer.   Use blankets that are made of polyester or cotton.   Wash hands frequently. SEEK MEDICAL CARE IF:   You have muscle aches.   You have chest pain.   The sputum changes from clear or white to yellow, green, gray, or bloody.   The sputum you cough up gets thicker.   There  are problems that may be related to the medicine you are given, such as a rash, itching, swelling, or trouble breathing.  SEEK IMMEDIATE MEDICAL CARE IF:   You have worsening wheezing and coughing even after taking your prescribed medicines.   You have increased difficulty breathing.   You develop severe chest pain. MAKE SURE YOU:   Understand these instructions.  Will watch your condition.  Will get help right away  if you are not doing well or get worse.   This information is not intended to replace advice given to you by your health care provider. Make sure you discuss any questions you have with your health care provider.   Document Released: 01/31/2003 Document Revised: 02/18/2014 Document Reviewed: 07/20/2012 Elsevier Interactive Patient Education [employer].

## 2015-04-24 NOTE — ED Provider Notes (Signed)
CSN: [id number]     Arrival date & time 04/24/15  1648 History   First MD Initiated Contact with Patient 04/24/15 1758     Chief Complaint  Patient presents with  . URI   (Consider location/radiation/quality/duration/timing/severity/associated sxs/prior Treatment) HPI Comments: 50 year old female complaining of cough, feeling tired, sore throat, PND and shortness of breath. Denies earache, GI or GU symptoms. Past medical history includes chronic bronchitis and hypertension. She did not take her antihypertensives today. She has been using her handheld albuterol HFA.   Past Medical History  Diagnosis Date  . Iron deficiency anemia secondary to blood loss (chronic)   . Menorrhagia   . Fibroids   . SVD (spontaneous vaginal delivery)     x 1  . HTN (hypertension)     no meds  . [id number])    Past Surgical History  Procedure Laterality Date  . Wisdom tooth extraction    . Dilitation & currettage/hystroscopy with versapoint resection N/A 06/08/2012    Procedure: DILATATION & CURETTAGE/HYSTEROSCOPY WITH VERSAPOINT RESECTION;  Surgeon: Lyman Speller, MD;  Location: [facility];  Service: Gynecology;  Laterality: N/A;   Family History  Problem Relation Age of Onset  . Diabetes Father   . Hypertension Father   . Hypertension Mother   . Congestive Heart Failure Mother   . Hypertension Brother   . Hypertension Brother   . Hypertension Brother   . Hypertension Sister   . Heart disease Brother   . Heart attack Brother   . Breast cancer Paternal Grandmother   . Heart disease Maternal Grandmother     pacemaker  . Hypertension Maternal Grandmother   . Lung cancer Paternal Grandfather   . Lung cancer Father    Social History  Substance Use Topics  . Smoking status: Never Smoker   . Smokeless tobacco: Never Used  . Alcohol Use: 0.0 oz/week    0 Standard drinks or equivalent per week     Comment: 1 glass twice a month   OB History    Gravida Para Term Preterm AB TAB SAB  Ectopic Multiple Living   1 1        1      Review of Systems  Constitutional: Positive for activity change and fatigue. Negative for fever, chills and appetite change.  HENT: Positive for congestion, postnasal drip, rhinorrhea and sore throat. Negative for ear pain, facial swelling and trouble swallowing.   Eyes: Negative.   Respiratory: Positive for cough and shortness of breath.   Cardiovascular: Negative.   Musculoskeletal: Negative for neck pain and neck stiffness.  Skin: Negative for pallor and rash.  Neurological: Negative.   All other systems reviewed and are negative.   Allergies  Hctz  Home Medications   Prior to Admission medications   Medication Sig Start Date End Date Taking? Authorizing Provider  albuterol (PROVENTIL HFA;VENTOLIN HFA) 108 (90 BASE) MCG/ACT inhaler Inhale 2 puffs into the lungs every 4 (four) hours as needed for wheezing or shortness of breath. 01/07/15   Konrad Felix, PA  furosemide (LASIX) 40 MG tablet Take 1 tablet (40 mg total) by mouth daily. 05/04/14   Dorothy Spark, MD  predniSONE (DELTASONE) 20 MG tablet Take 3 tabs po on first day, 2 tabs second day, 2 tabs third day, 1 tab fourth day, 1 tab 5th day. Take with food. 04/24/15   Janne Napoleon, NP   Meds Ordered and Administered this Visit   Medications  ipratropium-albuterol (DUONEB) 0.5-2.5 (3) MG/3ML nebulizer solution 3  mL (3 mLs Nebulization Given 04/24/15 1820)    BP 154/107 mmHg  Pulse 95  Temp(Src) 98.6 F (37 C) (Oral)  Resp 20  SpO2 100%  LMP 04/13/2015 No data found.   Physical Exam  Constitutional: She is oriented to person, place, and time. She appears well-developed and well-nourished. No distress.  HENT:  Mouth/Throat: No oropharyngeal exudate.  Bilateral TMs are normal. Oropharynx with mild clear PND and light cobblestoning.  Eyes: Conjunctivae and EOM are normal.  Neck: Normal range of motion. Neck supple.  Cardiovascular: Normal rate, regular rhythm and normal  heart sounds.   Pulmonary/Chest: Effort normal. No respiratory distress. She has wheezes.  Expiratory breath sounds are somewhat diminished. With cough there is expiratory wheezing.  Musculoskeletal: Normal range of motion. She exhibits no edema.  Lymphadenopathy:    She has no cervical adenopathy.  Neurological: She is alert and oriented to person, place, and time.  Skin: Skin is warm and dry. No rash noted.  Psychiatric: She has a normal mood and affect.  Nursing note and vitals reviewed.   ED Course  Procedures (including critical care time)  Labs Review Labs Reviewed - No data to display  Imaging Review No results found.   Visual Acuity Review  Right Eye Distance:   Left Eye Distance:   Bilateral Distance:    Right Eye Near:   Left Eye Near:    Bilateral Near:         MDM   1. URI (upper respiratory infection)   2. Bronchospasm   3. Essential hypertension     DuoNeb .5/2.5. Post DuoNeb patient states she is feeling better and breathing better. Auscultation reveals improved air movement and breath sounds and no wheezing. Continue using her albuterol HFA 2 puffs every 4 hours as needed for cough and wheeze Start the prednisone taper dose today. Take with food. Recommend taking an antihistamine such as Zyrtec, Claritin or Allegra Take your BP meds     Janne Napoleon, NP 04/24/15 1908  Janne Napoleon, NP 04/24/15 1909

## 2015-08-26 IMAGING — CR DG CHEST 1V PORT
1 series · 1 of 1 positions shown · non-contrast
Comparison: None.

CLINICAL DATA: Left-sided chest pain

EXAM:
PORTABLE CHEST - 1 VIEW

[AP]
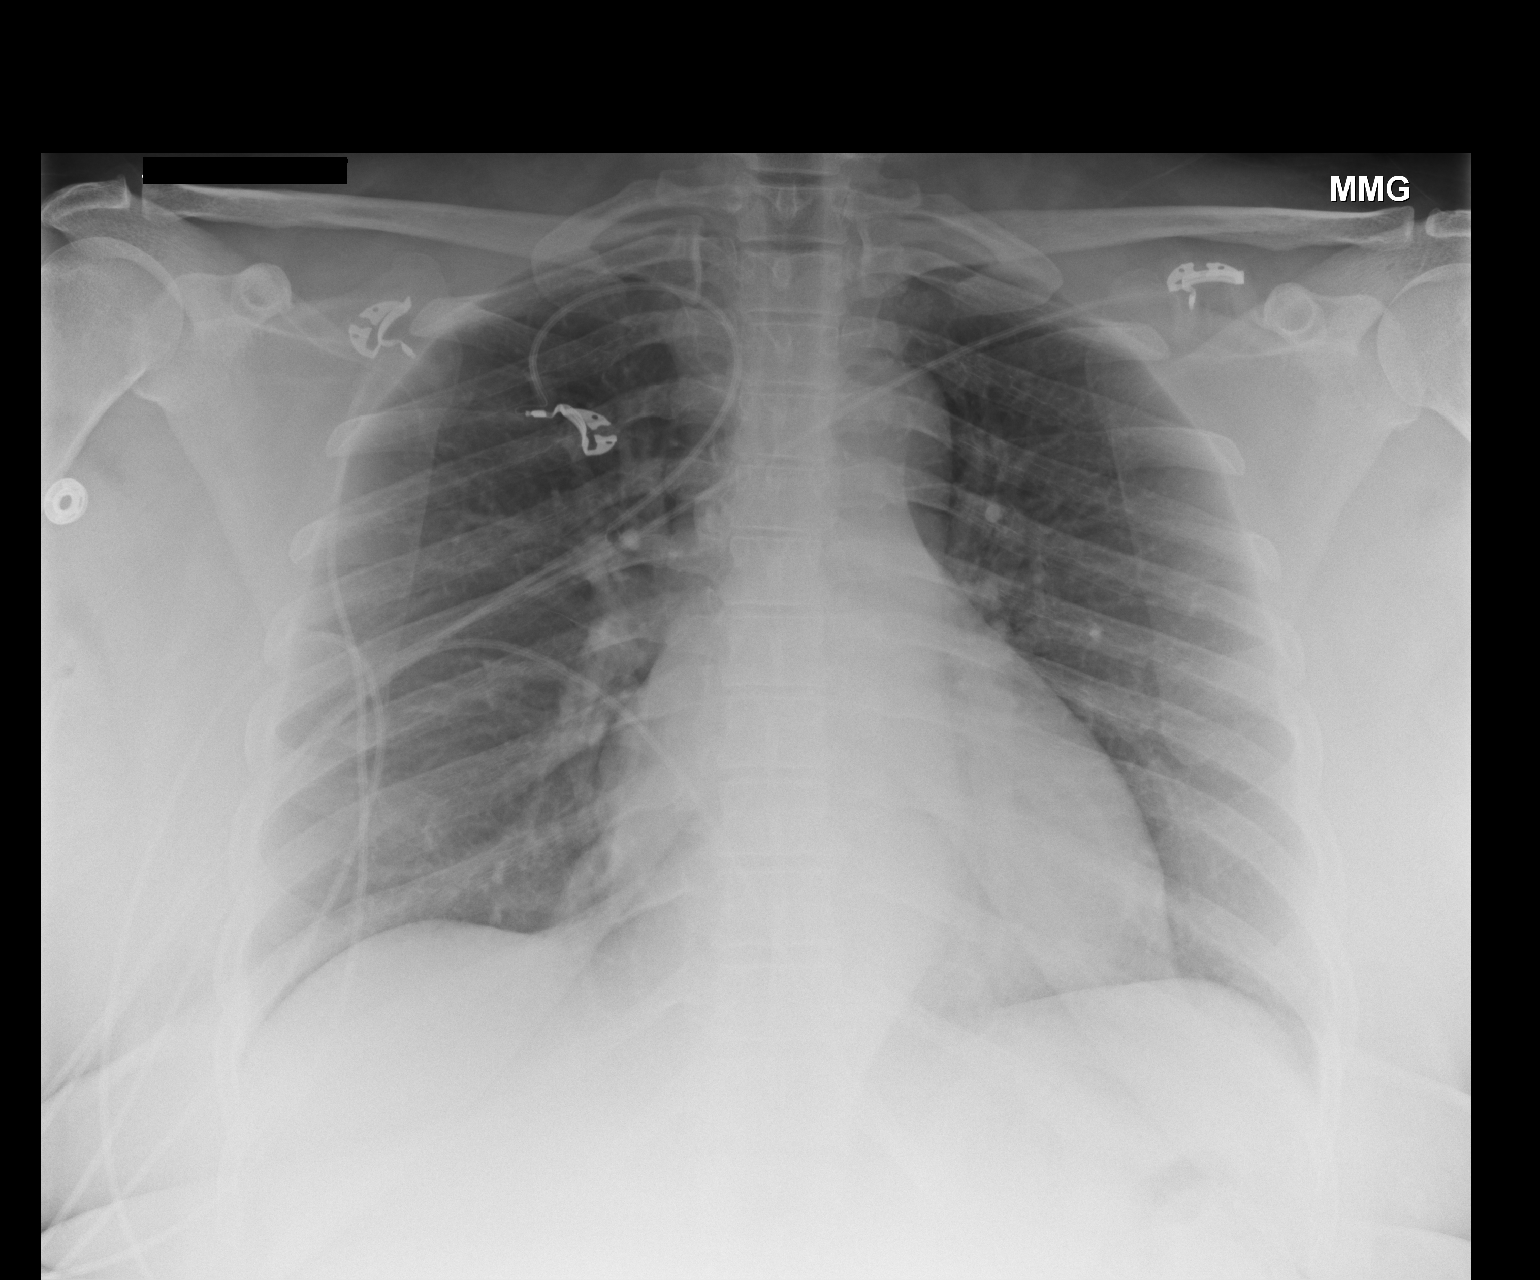

[1 of 1 positions shown; findings below may reference images not displayed]

FINDINGS: The heart size and mediastinal contours are within normal limits.
Both lungs are clear. The visualized skeletal structures are
unremarkable.
IMPRESSION: No radiographic evidence of active cardiopulmonary disease.

## 2016-01-09 ENCOUNTER — Encounter: Payer: Self-pay | Admitting: Nurse Practitioner

## 2016-01-09 ENCOUNTER — Ambulatory Visit (INDEPENDENT_AMBULATORY_CARE_PROVIDER_SITE_OTHER): Payer: Self-pay | Admitting: Nurse Practitioner

## 2016-01-09 VITALS — BP 160/102 | HR 72 | Ht 62.5 in | Wt 176.0 lb

## 2016-01-09 DIAGNOSIS — Z0000 Encounter for general adult medical examination without abnormal findings: Secondary | ICD-10-CM

## 2016-01-09 DIAGNOSIS — Z01419 Encounter for gynecological examination (general) (routine) without abnormal findings: Secondary | ICD-10-CM

## 2016-01-09 NOTE — Patient Instructions (Signed)

## 2016-01-09 NOTE — Progress Notes (Signed)
Patient ID: Belinda Petersen, female   DOB: 16-Dec-1965, 50 y.o.   MRN: [id number]  50 y.o. G1P0101 Married  African American Fe here for annual exam.  Intentional weight loss this past year of 70 lbs.  She saw a nutritionist and started working out at gym 3 days a week.   Son was a big motivator in both pt and husband loosing weight.   Menses on 11/1  was normal lasting for 5 days with usual PMS symptoms and cramps.  Then on 11/21  menses again that lasted for 5 days with cramp only but no other usual PMS symptoms.  She had been very stressed preparing a meal for her mother in law and family gathering.    Patient's last menstrual period was 01/02/2016.          Sexually active: Yes.    The current method of family planning is none.   (husband has infertility issues, no BC for yrs.)  Exercising: Yes.    cardio, weights and yoga 3 days per week Smoker:  no  Health Maintenance: Pap: 12/30/14, Negative  7-8 yrs ago in [place] had abnormal pap with a repeat and was normal MMG: 10/28/13 will schedule for next month TDaP: 7-8 yrs ago HIV: done today Labs: PCP takes care of all labs   reports that she has never smoked. She has never used smokeless tobacco. She reports that she drinks alcohol. She reports that she does not use drugs.  Past Medical History:  Diagnosis Date  . Fibroids   . Headache(784.0)   . HTN (hypertension)    no meds  . Iron deficiency anemia secondary to blood loss (chronic)   . Menorrhagia   . SVD (spontaneous vaginal delivery)    x 1    Past Surgical History:  Procedure Laterality Date  . DILITATION & CURRETTAGE/HYSTROSCOPY WITH VERSAPOINT RESECTION N/A 06/08/2012   Procedure: DILATATION & CURETTAGE/HYSTEROSCOPY WITH VERSAPOINT RESECTION;  Surgeon: Lyman Speller, MD;  Location: [facility];  Service: Gynecology;  Laterality: N/A;  . WISDOM TOOTH EXTRACTION      Current Outpatient Prescriptions  Medication Sig Dispense Refill  . albuterol (PROVENTIL HFA;VENTOLIN HFA) 108  (90 BASE) MCG/ACT inhaler Inhale 2 puffs into the lungs every 4 (four) hours as needed for wheezing or shortness of breath. 1 Inhaler 0   No current facility-administered medications for this visit.     Family History  Problem Relation Age of Onset  . Diabetes Father   . Hypertension Father   . Hypertension Mother   . Congestive Heart Failure Mother   . Hypertension Brother   . Hypertension Brother   . Hypertension Brother   . Hypertension Sister   . Heart disease Brother   . Heart attack Brother   . Breast cancer Paternal Grandmother   . Heart disease Maternal Grandmother     pacemaker  . Hypertension Maternal Grandmother   . Lung cancer Paternal Grandfather   . Lung cancer Father     ROS:  Pertinent items are noted in HPI.  Otherwise, a comprehensive ROS was negative.  Exam:   BP (!) 160/102 (BP Location: Right Arm, Patient Position: Sitting, Cuff Size: Large)   Pulse 72   Ht 5' 2.5" (1.588 m)   Wt 176 lb (79.8 kg)   LMP 01/02/2016   BMI 31.68 kg/m  Height: 5' 2.5" (158.8 cm) Ht Readings from Last 3 Encounters:  01/09/16 5' 2.5" (1.588 m)  12/30/14 5\' 3"  (1.6 m)  05/04/14 5\' 3"  (  1.6 m)    General appearance: alert, cooperative and appears stated age Head: Normocephalic, without obvious abnormality, atraumatic Neck: no adenopathy, supple, symmetrical, trachea midline and thyroid normal to inspection and palpation Lungs: clear to auscultation bilaterally Breasts: normal appearance, no masses or tenderness Heart: regular rate and rhythm Abdomen: soft, non-tender; no masses,  no organomegaly Extremities: extremities normal, atraumatic, no cyanosis or edema Skin: Skin color, texture, turgor normal. No rashes or lesions Lymph nodes: Cervical, supraclavicular, and axillary nodes normal. No abnormal inguinal nodes palpated Neurologic: Grossly normal   Pelvic: External genitalia:  no lesions              Urethra:  normal appearing urethra with no masses, tenderness or  lesions              Bartholin's and Skene's: normal                 Vagina: normal appearing vagina with normal color and discharge, no lesions              Cervix: anteverted              Pap taken: No. 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  Chaperone present: yes  A:  Well Woman with normal exam  Perimenopausal c/w irregular menses  Will be doing a menses calendar and report any continued AUB  Family hx of breast cancer in maternal GM, in her 72's, and maternal aunt, 77's  Hypertension with left diastolic dysfunction.  Off Benicar due to cost  BP reduced with weight loss - now only white coat HTN  Fibroid uterus  H/O anemia    P:   Reviewed health and wellness pertinent to exam  Pap smear as above  Mammogram is due and plans to get done in 12/17  Follow with labs  Monitor menses and report any continued AUB  When or if flare of rash on buttocks - she will call for apt and HSV culture  Counseled on breast self exam, mammography screening, adequate intake of calcium and vitamin D, diet and exercise, Kegel's exercises return annually or prn  An After Visit Summary was printed and given to the patient.

## 2016-01-10 LAB — HIV ANTIBODY (ROUTINE TESTING W REFLEX): HIV: NONREACTIVE

## 2016-01-11 NOTE — Progress Notes (Signed)
Reviewed personally.  M. Suzanne Sister Carbone, MD.  

## 2016-10-16 ENCOUNTER — Ambulatory Visit (INDEPENDENT_AMBULATORY_CARE_PROVIDER_SITE_OTHER): Payer: No Typology Code available for payment source | Admitting: Cardiology

## 2016-10-16 ENCOUNTER — Encounter: Payer: Self-pay | Admitting: Cardiology

## 2016-10-16 VITALS — BP 160/110 | HR 76 | Ht 63.0 in | Wt 184.0 lb

## 2016-10-16 DIAGNOSIS — D649 Anemia, unspecified: Secondary | ICD-10-CM

## 2016-10-16 DIAGNOSIS — R5383 Other fatigue: Secondary | ICD-10-CM | POA: Diagnosis not present

## 2016-10-16 DIAGNOSIS — R55 Syncope and collapse: Secondary | ICD-10-CM | POA: Diagnosis not present

## 2016-10-16 DIAGNOSIS — R0683 Snoring: Secondary | ICD-10-CM | POA: Diagnosis not present

## 2016-10-16 MED ORDER — OLMESARTAN MEDOXOMIL 40 MG PO TABS: 40.0000 mg | ORAL_TABLET | Freq: Every day | ORAL | 5 refills | Status: DC

## 2016-10-16 NOTE — Patient Instructions (Addendum)
Medication Instructions: Your physician has recommended you make the following change in your medication:  - 1) START Olmesartan (Benicar) 40 mg - Take 1 tablet (40 mg) by mouth daily  Labwork: Your physician recommends that you have lab work today: TSH, Ferritin, Iron, and CBC  Procedures/Testing: Your physician has requested that you have an echocardiogram. Echocardiography is a painless test that uses sound waves to create images of your heart. It provides your doctor with information about the size and shape of your heart and how well your heart's chambers and valves are working. This procedure takes approximately one hour. There are no restrictions for this procedure.  Your physician has recommended that you wear a 30 day event monitor. Event monitors are medical devices that record the heart's electrical activity. Doctors most often [place] these monitors to diagnose arrhythmias. Arrhythmias are problems with the speed or rhythm of the heartbeat. The monitor is a small, portable device. You can wear one while you do your normal daily activities. This is usually used to diagnose what is causing palpitations/syncope (passing out)  Your physician has recommended that you have a sleep study. This test records several body functions during sleep, including: brain activity, eye movement, oxygen and carbon dioxide blood levels, heart rate and rhythm, breathing rate and rhythm, the flow of air through your mouth and nose, snoring, body muscle movements, and chest and belly movement.  Follow-Up: Your physician recommends that you schedule a follow-up appointment in: 2 weeks with the hypertension clinic  Your physician recommends that you schedule a follow-up appointment in: 6 weeks with Dr. Meda Coffee or Lyda Jester, PA-C   If you need a refill on your cardiac medications before your next appointment, please call your pharmacy.    - Try to avoid sodium intake and caffeine due to your high blood  pressure.

## 2016-10-16 NOTE — Progress Notes (Signed)
10/16/2016 Belinda Petersen   1965/12/16  [id number]  Primary Physician Patient, No Pcp Per Primary Cardiologist: Dr. Meda Coffee    Reason for Visit/CC: Syncope and Fatigue   HPI:  Belinda Petersen is a 51 y.o. female followed by Dr. Meda Coffee, who presents to clinic for evaluation given recent syncopal episode.  Her PMH is notable for HTN and HLD. She was evaluated in the past for chest pain. 2 visits to the ER, normal EKG, normal enzymes, no regional wall motion abnormalities on the echocardiogram with preserved LVEF, and normal stress test in September 2015. Last seen by Dr. Meda Coffee in 2016 and has been doing ok until recently. She was previously on antihypertensives for BP, but it appears she has stopped all meds.   10/03/16, she was seen at th ED in [place] at [facility] for syncope. Records have been faxed for review. Per notes, pt's episode was witness by her husband. Brief LOC while sitting followed by emesis. This occurred while out to eat dinner at a restaurant. Pt felt fine earlier that day. She ate her appetizer, her main meal and a glass of wine, then suddenly felt unwell and hot. She took a sip of water and felt a bit nauseated. She notes her vision got hazy. Her husband said that she did not look well and looked like she was going to pass out. There was LOC for about 1 min. Her husband said that she started snoring. No seizure-like activity. Pt denies any associated CP, dyspnea or palpitations. No extremity weakness or numbness. Pt was taken to local ER. Labs were notable for anemia with Hgb ~9, which the pt reports is her baseline. BMP was WNL. No signs of dehydration or electrolyte abnormalities. Troponins were negative. CXR unremarkable. It is mentioned in note that EKG showed questionable bifascicular block, however EKG was not faxed to our office. Her BP was elevated at 165/108.Given her abnormal EKG, she was advised to f/u in our office for further w/u and to also f/u  with her PCP regarding her anemia.   Today in clinic, she denies any further recurrence of syncope/ near syncope. However she notes severe fatigue throughout the day. She denies CP. She works out 2-3 days a week w/o exertional CP or dyspnea. She reports menorrhagia. She has h/o fibroids and underwent fibroidectomy, however was told of risk of recurrence. She is  followed by a gynecology. She has a f/u in Nov. She admits that she has not been compliant with supplemental Fe. She also admits that she stopped taking all of her BP meds about 1 year ago as she thought she could control BP through diet an exercise. She was on Benicar in the past, which she reports she tolerated well w/o side effects.   BP is elevated today at 160/100. EKG shows NSR. No ischemic abnormalities. HR 77 bpm. Only current symptom is fatigue.     Current Meds  Medication Sig  . albuterol (PROVENTIL HFA;VENTOLIN HFA) 108 (90 BASE) MCG/ACT inhaler Inhale 2 puffs into the lungs every 4 (four) hours as needed for wheezing or shortness of breath.  . Biotin 1 MG CAPS Take 1 mg by mouth daily.  . Multiple Vitamin (MULTIVITAMIN) tablet Take 1 tablet by mouth daily.   Allergies  Allergen Reactions  . Hctz [Hydrochlorothiazide]     itching   Past Medical History:  Diagnosis Date  . Fibroids   . Headache(784.0)   . HTN (hypertension)    no meds  . Iron  deficiency anemia secondary to blood loss (chronic)   . Menorrhagia   . SVD (spontaneous vaginal delivery)    x 1   Family History  Problem Relation Age of Onset  . Diabetes Father   . Hypertension Father   . Hypertension Mother   . Congestive Heart Failure Mother   . Hypertension Brother   . Hypertension Brother   . Hypertension Brother   . Hypertension Sister   . Heart disease Brother   . Heart attack Brother   . Breast cancer Paternal Grandmother   . Heart disease Maternal Grandmother        pacemaker  . Hypertension Maternal Grandmother   . Lung cancer  Paternal Grandfather   . Lung cancer Father    Past Surgical History:  Procedure Laterality Date  . DILITATION & CURRETTAGE/HYSTROSCOPY WITH VERSAPOINT RESECTION N/A 06/08/2012   Procedure: DILATATION & CURETTAGE/HYSTEROSCOPY WITH VERSAPOINT RESECTION;  Surgeon: Lyman Speller, MD;  Location: [facility];  Service: Gynecology;  Laterality: N/A;  . WISDOM TOOTH EXTRACTION     Social History   Social History  . Marital status: Married    Spouse name: N/A  . Number of children: 1  . Years of education: N/A   Occupational History  .  [employer]   Social History Main Topics  . Smoking status: Never Smoker  . Smokeless tobacco: Never Used  . Alcohol use 0.0 oz/week     Comment: 1 glass twice a month  . Drug use: No  . Sexual activity: Yes    Partners: Male    Birth control/ protection: None   Other Topics Concern  . Not on file   Social History Narrative  . No narrative on file     Review of Systems: General: negative for chills, fever, night sweats or weight changes.  Cardiovascular: negative for chest pain, dyspnea on exertion, edema, orthopnea, palpitations, paroxysmal nocturnal dyspnea or shortness of breath Dermatological: negative for rash Respiratory: negative for cough or wheezing Urologic: negative for hematuria Abdominal: negative for nausea, vomiting, diarrhea, bright red blood per rectum, melena, or hematemesis Neurologic: negative for visual changes, syncope, or dizziness All other systems reviewed and are otherwise negative except as noted above.   Physical Exam:  Blood pressure (!) 160/110, pulse 76, height 5\' 3"  (1.6 m), weight 184 lb (83.5 kg).  General appearance: alert, cooperative and no distress Neck: no carotid bruit and no JVD Lungs: clear to auscultation bilaterally Heart: regular rate and rhythm, S1, S2 normal, no murmur, click, rub or gallop Extremities: extremities normal, atraumatic, no cyanosis or edema Pulses: 2+ and  symmetric Skin: Skin color, texture, turgor normal. No rashes or lesions Neurologic: Grossly normal  EKG  -- personally reviewed   ASSESSMENT AND PLAN:   1. Syncope: Based on description, event may have been vasovagal syncope. Pt was nauseated and felt unwell after eating a meal. We will check 2D echo and will arrange for 30 day monitor. F/u in 5-6 weeks. No stress test at this point, in the absence of exertional CP and normal EKG.   2. Fatigue: her anemia is likely contributing. Hgb was 9.7 in ED in [place]. Pt notes h/o fibroids and menorrhagia. We will check a repeat CBC to ensure no further drop in H/H. Will also check Iron and Ferritin levels. Pt encouraged to restart supplemental Fe along with stool softener. We will also check a TSH and order a sleep study (h/o snoring). We are ordering a 2D echo to assess  LVF and cardiac monitor to r/o arrhthymias given syncope. Also check Vitamin D level.    3. HTN: elevated at 160/110. BP was also elevated in ED recently at 165/108. Pt self discontinued all BP meds ~1 year ago. Pt was previously on Benicar and did well w/o side effects. We will restart her previous dose of 40 mg daily. F/u in HTN clinic in 2 weeks. May need 2nd agent. Consider thiazide diuretic or amlodipine if additional coverage needed.     Follow-Up w/ Dr. Meda Coffee or Myself after monitor in 5-6 weeks. HTN clinic f/u will be arranged in 2 weeks. Pt is also in need of a new PCP. She was given contact number to arrange appt with new provider.   Negin Hegg Ladoris Gene, MHS [facility] 10/16/2016 3:12 PM

## 2016-10-16 NOTE — Addendum Note (Signed)
Addended by: Claude Manges on: 10/16/2016 03:55 PM   Modules accepted: Orders

## 2016-10-17 ENCOUNTER — Telehealth: Payer: Self-pay | Admitting: *Deleted

## 2016-10-17 LAB — IRON: IRON: 34 ug/dL (ref 27–159)

## 2016-10-17 LAB — CBC WITH DIFFERENTIAL/PLATELET
BASOS ABS: 0 10*3/uL (ref 0.0–0.2)
Basos: 1 %
EOS (ABSOLUTE): 0 10*3/uL (ref 0.0–0.4)
Eos: 1 %
HEMOGLOBIN: 9.9 g/dL — AB (ref 11.1–15.9)
Hematocrit: 32.1 % — ABNORMAL LOW (ref 34.0–46.6)
Immature Grans (Abs): 0 10*3/uL (ref 0.0–0.1)
Immature Granulocytes: 0 %
LYMPHS ABS: 1.1 10*3/uL (ref 0.7–3.1)
Lymphs: 25 %
MCH: 24.4 pg — ABNORMAL LOW (ref 26.6–33.0)
MCHC: 30.8 g/dL — ABNORMAL LOW (ref 31.5–35.7)
MCV: 79 fL (ref 79–97)
Monocytes Absolute: 0.5 10*3/uL (ref 0.1–0.9)
Monocytes: 12 %
Neutrophils Absolute: 2.7 10*3/uL (ref 1.4–7.0)
Neutrophils: 61 %
Platelets: 335 10*3/uL (ref 150–379)
RBC: 4.06 x10E6/uL (ref 3.77–5.28)
RDW: 16.8 % — AB (ref 12.3–15.4)
WBC: 4.4 10*3/uL (ref 3.4–10.8)

## 2016-10-17 LAB — FERRITIN: Ferritin: 17 ng/mL (ref 15–150)

## 2016-10-17 LAB — VITAMIN D 25 HYDROXY (VIT D DEFICIENCY, FRACTURES): VIT D 25 HYDROXY: 23.6 ng/mL — AB (ref 30.0–100.0)

## 2016-10-17 LAB — TSH: TSH: 1.37 u[IU]/mL (ref 0.450–4.500)

## 2016-10-17 NOTE — Telephone Encounter (Signed)
-----   Message from [facility], [place] sent at 10/16/2016  3:17 PM EDT ----- Regarding: Sleep study Pt needs a sleep study for snoring and fatigue per Lyda Jester, PA-C

## 2016-10-17 NOTE — Telephone Encounter (Addendum)
Informed patient of upcoming sleep study and patient understanding was verbalized. Patient understands her sleep study is scheduled for Wednesday December 11 2016. Patient understands her sleep study will be done at [facility] sleep lab. Patient understands she will receive a sleep packet in a week or so. Patient understands to call if she does not receive the sleep packet in a timely manner. Patient agrees with treatment and thanked me for call. ESS=9

## 2016-10-18 ENCOUNTER — Telehealth: Payer: Self-pay | Admitting: Cardiology

## 2016-10-18 NOTE — Telephone Encounter (Signed)
Returned pts call.  She had some questions as to whether [place], PA-C had made a determination as to why she had pre-syncope. I explained to the that her labs were ok and that we really wouldn't make a determination until her testing was done and we had some results.  Pt was just concerned that she still had the headache.  I explained to her that her bp was running pretty high, and that hopefully the new medication that was started yesterday, would start to working and get her bp down and hopefully her headache would decrease. Pt thanked me for the call.

## 2016-10-18 NOTE — Telephone Encounter (Signed)
New message    Pt is calling about her labs. She said she spoke earlier to someone but has another question. Please call.

## 2016-10-20 ENCOUNTER — Emergency Department (HOSPITAL_COMMUNITY)
Admission: EM | Admit: 2016-10-20 | Discharge: 2016-10-20 | Disposition: A | Discharge status: Home / Self Care | Payer: No Typology Code available for payment source | Attending: Emergency Medicine | Admitting: Emergency Medicine

## 2016-10-20 ENCOUNTER — Encounter (HOSPITAL_COMMUNITY): Payer: Self-pay | Admitting: *Deleted

## 2016-10-20 DIAGNOSIS — R51 Headache: Secondary | ICD-10-CM | POA: Diagnosis present

## 2016-10-20 DIAGNOSIS — R410 Disorientation, unspecified: Secondary | ICD-10-CM | POA: Diagnosis not present

## 2016-10-20 DIAGNOSIS — I10 Essential (primary) hypertension: Secondary | ICD-10-CM

## 2016-10-20 DIAGNOSIS — Z79899 Other long term (current) drug therapy: Secondary | ICD-10-CM | POA: Diagnosis not present

## 2016-10-20 MED ORDER — AMLODIPINE BESYLATE 5 MG PO TABS
5.0000 mg | ORAL_TABLET | Freq: Once | ORAL | Status: AC
  Administered 2016-10-20: 5 mg via ORAL
  Filled 2016-10-20: qty 1

## 2016-10-20 MED ORDER — AMLODIPINE BESYLATE 5 MG PO TABS: 5.0000 mg | ORAL_TABLET | Freq: Every day | ORAL | 0 refills | Status: DC

## 2016-10-20 NOTE — Discharge Instructions (Addendum)
Please read and follow all provided instructions.  Your diagnoses today include:  1. Hypertension, unspecified type     Tests performed today include: Vital signs. See below for your results today.   Medications prescribed:  Take as prescribed   Home care instructions:  Follow any educational materials contained in this packet.  Follow-up instructions: Please follow-up with your Cardiolgist for further evaluation of symptoms and treatment   Return instructions:  Please return to the Emergency Department if you do not get better, if you get worse, or new symptoms OR  - Fever (temperature greater than 101.58F)  - Bleeding that does not stop with holding pressure to the area    -Severe pain (please note that you may be more sore the day after your accident)  - Chest Pain  - Difficulty breathing  - Severe nausea or vomiting  - Inability to tolerate food and liquids  - Passing out  - Skin becoming red around your wounds  - Change in mental status (confusion or lethargy)  - New numbness or weakness    Please return if you have any other emergent concerns.  Additional Information:  Your vital signs today were: BP (!) 193/122 (BP Location: Left Arm)    Pulse 74    Temp 98.1 F (36.7 C) (Oral)    Resp 18    LMP 10/05/2016    SpO2 100%  If your blood pressure (BP) was elevated above 135/85 this visit, please have this repeated by your doctor within one month. ---------------

## 2016-10-20 NOTE — ED Triage Notes (Signed)
Pt states the nurse took the pt's BP at church today, which was 170/110. Pt states she has a headache and has fogginess, but denies dizziness. Pt states she took her blood pressure medication. Pt is being followed by a cardiologist and was put on BP medication 4 days ago.

## 2016-10-20 NOTE — ED Provider Notes (Signed)
[facility] DEPT Provider Note   CSN: [id number] Arrival date & time: 10/20/16  1346     History   Chief Complaint Chief Complaint  Patient presents with  . Hypertension    HPI Belinda Petersen is a 51 y.o. female.  HPI  51 y.o. female with a hx of HTN, presents to the Emergency Department today due to elevated blood pressure at church today. Noted 170/110. NOtes associated headache as well as "fogginess." denies dizziness. Pt took all her BP medication. Pt followed by Cardiology and was placed on BP medication x 4 days ago. Per chart review, she was evaluated in the past for chest pain.Two ED visits with normal EKG, normal Troponins, no regional wall motion abnormalities on the echocardiogram with preserved LVEF, and normal stress test in September 2015. She was previously on antihypertensives for BP, but stopped. Pt currently started on Benicar 40mg  Daily x 4 days ago. Noted headache earlier this morning at church, but has since resolved. Denies N/V. NO numbness/tingling. No visual changes. No CP/SOB/ABD pain. No unilateral numbness/tingling. No other symptoms noted.    Past Medical History:  Diagnosis Date  . Fibroids   . Headache(784.0)   . HTN (hypertension)    no meds  . Iron deficiency anemia secondary to blood loss (chronic)   . Menorrhagia   . SVD (spontaneous vaginal delivery)    x 1    Patient Active Problem List   Diagnosis Date Noted  . DOE (dyspnea on exertion) 05/04/2014  . Hypertensive urgency 10/14/2013  . Chest pain 10/13/2013  . Fibroids, submucosal 06/08/2012  . Menorrhagia 06/08/2012  . Fibroid uterus 04/29/2012  . 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 Essential hypertension, benign 04/29/2012    Past Surgical History:  Procedure Laterality Date  . DILITATION & CURRETTAGE/HYSTROSCOPY WITH VERSAPOINT RESECTION N/A 06/08/2012   Procedure: DILATATION & CURETTAGE/HYSTEROSCOPY WITH VERSAPOINT RESECTION;  Surgeon: Lyman Speller, MD;  Location: [facility];  Service: Gynecology;  Laterality: N/A;  . WISDOM TOOTH EXTRACTION      OB History    Gravida Para Term Preterm AB Living   1 1 0 1 0 1   SAB TAB Ectopic Multiple Live Births   0 0 0 0 1       Home Medications    Prior to Admission medications   Medication Sig Start Date End Date Taking? Authorizing Provider  albuterol (PROVENTIL HFA;VENTOLIN HFA) 108 (90 BASE) MCG/ACT inhaler Inhale 2 puffs into the lungs every 4 (four) hours as needed for wheezing or shortness of breath. 01/07/15   Konrad Felix, PA  Biotin 1 MG CAPS Take 1 mg by mouth daily.    [provider]  Multiple Vitamin (MULTIVITAMIN) tablet Take 1 tablet by mouth daily.    [provider]  olmesartan (BENICAR) 40 MG tablet Take 1 tablet (40 mg total) by mouth daily. 10/16/16   Consuelo Pandy, PA-C    Family History Family History  Problem Relation Age of Onset  . Diabetes Father   . Hypertension Father   . Lung cancer Father   . Hypertension Mother   . Congestive Heart Failure Mother   . Hypertension Brother   . Hypertension Brother   . Hypertension Brother   . Hypertension Sister   . Heart disease Brother   . Heart attack Brother   . Breast cancer Paternal Grandmother   . Heart disease Maternal Grandmother        pacemaker  .  Hypertension Maternal Grandmother   . Lung cancer Paternal Grandfather     Social History Social History  Substance Use Topics  . Smoking status: Never Smoker  . Smokeless tobacco: Never Used  . Alcohol use 0.0 oz/week     Comment: 1 glass twice a month     Allergies   Hctz [hydrochlorothiazide]   Review of Systems Review of Systems ROS reviewed and all are negative for acute change except as noted in the HPI.  Physical Exam Updated Vital Signs BP (!) 193/122 (BP Location: Left Arm)   Pulse 74   Temp 98.1 F (36.7 C) (Oral)   Resp 18   LMP 10/05/2016   SpO2 100%   Physical Exam  Constitutional:  She is oriented to person, place, and time. Vital signs are normal. She appears well-developed and well-nourished.  HENT:  Head: Normocephalic and atraumatic.  Right Ear: Hearing normal.  Left Ear: Hearing normal.  Eyes: Pupils are equal, round, and reactive to light. Conjunctivae and EOM are normal.  Neck: Normal range of motion. Neck supple.  Cardiovascular: Normal rate, regular rhythm, normal heart sounds and intact distal pulses.   Pulmonary/Chest: Effort normal and breath sounds normal.  Abdominal: Soft. There is no tenderness.  Musculoskeletal: Normal range of motion.  Neurological: She is alert and oriented to person, place, and time. She has normal strength. No cranial nerve deficit or sensory deficit.  Cranial Nerves:  II: Pupils equal, round, reactive to light III,IV, VI: ptosis not present, extra-ocular motions intact bilaterally  V,VII: smile symmetric, facial light touch sensation equal VIII: hearing grossly normal bilaterally  IX,X: midline uvula rise  XI: bilateral shoulder shrug equal and strong XII: midline tongue extension  Skin: Skin is warm and dry.  Psychiatric: She has a normal mood and affect. Her speech is normal and behavior is normal. Thought content normal.  Nursing note and vitals reviewed.  ED Treatments / Results  Labs (all labs ordered are listed, but only abnormal results are displayed) Labs Reviewed - No data to display  EKG  EKG Interpretation None       Radiology No results found.  Procedures Procedures (including critical care time)  Medications Ordered in ED Medications  amLODipine (NORVASC) tablet 5 mg (not administered)     Initial Impression / Assessment and Plan / ED Course  I have reviewed the triage vital signs and the nursing notes.  Pertinent labs & imaging results that were available during my care of the patient were reviewed by me and considered in my medical decision making (see chart for details).   Final  Clinical Impressions(s) / ED Diagnoses     {I have reviewed the relevant previous healthcare records.  {I obtained HPI from historian. {Patient discussed with supervising physician.  ED Course:  Assessment: Pt is a 51 y.o. female with a hx of HTN, presents to the Emergency Department today due to elevated blood pressure at church today. Noted 170/110. NOtes associated headache as well as "fogginess." denies dizziness. Pt took all her BP medication. Pt followed by Cardiology and was placed on BP medication x 4 days ago. Per chart review, she was evaluated in the past for chest pain.Two ED visits with normal EKG, normal Troponins, no regional wall motion abnormalities on the echocardiogram with preserved LVEF, and normal stress test in September 2015. She was previously on antihypertensives for BP, but stopped. Pt currently started on Benicar 40mg  Daily x 4 days ago. Noted headache earlier this morning at church, but  has since resolved. Denies N/V. NO numbness/tingling. No visual changes. No CP/SOB/ABD pain. No unilateral numbness/tingling.On exam, pt in NAD. Nontoxic/nonseptic appearing. VSS. BP does show 177/118 when I checked in room. Afebrile. Lungs CTA. Heart RRR. Abdomen nontender soft. Chart review shows Cardiology possibly adding amlodipine or HCTZ. Noted allergy to HCTZ so wil lstart on Amlodipine 5mg . Counseled on logging BP at home as well as exercise and salt reduction. Plan is to DC home with follow up to Cardiology. Strict return precautions given.. At time of discharge, Patient is in no acute distress. Vital Signs are stable. Patient is able to ambulate. Patient able to tolerate PO.   Disposition/Plan:  DC Home Additional Verbal discharge instructions given and discussed with patient.  Pt Instructed to f/u with Cardiology in the next week for evaluation and treatment of symptoms. Return precautions given Pt acknowledges and agrees with plan  Supervising Physician Carmin Muskrat,  MD  Final diagnoses:  Hypertension, unspecified type    New Prescriptions New Prescriptions   No medications on file     Shary Decamp, Hershal Coria 10/20/16 Dana, Robert, MD 10/20/16 2319

## 2016-10-22 ENCOUNTER — Telehealth: Payer: Self-pay | Admitting: Cardiology

## 2016-10-22 NOTE — Telephone Encounter (Signed)
New Message  Pt call requesting to speak with RN. Pt states she went to the hospital after being seen in the office. Pt states she is still experiencing high bp. Pt stats she had some appt coming up and would like to know if she needs to be seen sooner than next available with APP. Please call back to discuss

## 2016-10-22 NOTE — Telephone Encounter (Signed)
Returned pts call.  She has been made aware that we will give the added Amlodipine time to work, keep her f/u appt in the HTN clinic, and bring a log of bp readings with her to her appt. She verbalized understanding and thanked me for my call.

## 2016-10-22 NOTE — Telephone Encounter (Signed)
I've reviewed ED note. Agree with addition of amlodipine. Also continue benicar. We may need to retry a diuretic but lets give the amlodipine time to kick-in. Make sure she has f/u in the HTN clinic in the next 1-3 weeks.

## 2016-10-22 NOTE — Telephone Encounter (Signed)
See phone note

## 2016-10-22 NOTE — Telephone Encounter (Signed)
I agree with what you told her. Her blood work was ok, except for low vitamin D. This should not cause fainting but can contribute to fatigue and high blood pressure. We need additional info from cardiac monitor and echo, which we do not yet have results for.  We will be notified by monitor company if any worrisome heart rhythm abnormalities are detected.

## 2016-10-29 ENCOUNTER — Ambulatory Visit (INDEPENDENT_AMBULATORY_CARE_PROVIDER_SITE_OTHER): Payer: No Typology Code available for payment source

## 2016-10-29 ENCOUNTER — Encounter: Payer: Self-pay | Admitting: Radiology

## 2016-10-29 ENCOUNTER — Ambulatory Visit (HOSPITAL_COMMUNITY): Payer: No Typology Code available for payment source | Attending: Cardiology

## 2016-10-29 ENCOUNTER — Telehealth: Payer: Self-pay | Admitting: *Deleted

## 2016-10-29 ENCOUNTER — Other Ambulatory Visit: Payer: Self-pay

## 2016-10-29 DIAGNOSIS — R55 Syncope and collapse: Secondary | ICD-10-CM | POA: Diagnosis not present

## 2016-10-29 NOTE — Telephone Encounter (Signed)
Pt scheduled for HTN clinic to see Pharmacist on 9/21 at 0930.  Pt aware of this appt.

## 2016-10-29 NOTE — Telephone Encounter (Signed)
Patient have severe reaction to blood pressure medication, Benicar and Amlodipine.   Complains of bad headache, sinus pressure, and dizziness.  Please call to advise.

## 2016-10-29 NOTE — Telephone Encounter (Signed)
Can you please ask her when is her and ask about the side effects and schedule her with the blood pressure clinic - it has to be a pharmacist not a nurse. Thank you, KN

## 2016-11-01 ENCOUNTER — Ambulatory Visit (INDEPENDENT_AMBULATORY_CARE_PROVIDER_SITE_OTHER): Payer: PRIVATE HEALTH INSURANCE | Admitting: Pharmacist

## 2016-11-01 DIAGNOSIS — I10 Essential (primary) hypertension: Secondary | ICD-10-CM | POA: Diagnosis not present

## 2016-11-01 LAB — BASIC METABOLIC PANEL
BUN/Creatinine Ratio: 12 (ref 9–23)
BUN: 10 mg/dL (ref 6–24)
CALCIUM: 9.4 mg/dL (ref 8.7–10.2)
CO2: 25 mmol/L (ref 20–29)
CREATININE: 0.84 mg/dL (ref 0.57–1.00)
Chloride: 100 mmol/L (ref 96–106)
GFR calc Af Amer: 94 mL/min/{1.73_m2} (ref >59)
GFR, EST NON AFRICAN AMERICAN: 81 mL/min/{1.73_m2} (ref >59)
GLUCOSE: 59 mg/dL — AB (ref 65–99)
POTASSIUM: 4.6 mmol/L (ref 3.5–5.2)
Sodium: 138 mmol/L (ref 134–144)

## 2016-11-01 NOTE — Progress Notes (Signed)
Patient ID: Belinda Petersen                 DOB: 04/11/1965                      MRN: [id number]     HPI: Belinda Petersen is a 51 y.o. female patient of Dr Meda Coffee referred by Lyda Jester, PA to HTN clinic. PMH is significant for HTN, HLD, chest pain, and syncope. She was seen at the ED in [place] on 10/03/16 for syncope ? vasovagal episode, also found to be anemic. Pt reports she stopped taking all of her BP medications 1 year ago because she thought she could control her BP through diet and exercise. She had previously taken Benicar which she reportedly tolerated well. BP was elevated at OV on 10/16/16 to 160/100 and pt was resumed on previous dose of Benicar 40mg  daily. Pt presented to the ED 4 days later due to elevated BP [place] [facility] of 170/110 with associated headache and "fogginess." Amlodipine 5mg  daily was started. Pt called clinic 9 days later reporting headache, sinus pressure, and dizziness that she associated with her BP medications.  Pt presents to clinic today in good spirits. She does report side effects with her medications over the past few weeks including sinus pressure, nausea, dizziness, and headaches. Her symptoms worsened after she started taking amlodipine (particularly the fatigue and headaches). She decided to stop taking this 2 days ago and has started to feel better. She is tolerating her Benicar well, however reports that a 1 month supply costs her $167 and she would like a cheaper alternative. Discussed with pt that dizziness and headaches may be due to large changes in BP readings with recent addition of full strength Benicar and then amlodipine. She did also stop drinking coffee 3-4 weeks ago which may be contributing to her headaches (was drinking 32 oz per day).  Pt purchased a wrist BP cuff 1 week ago. Home readings are as follows when pt was taking both Benicar and amlodipine:  9/10 - 151/111 9/11 - 159/109, 154/97 (AM and PM) 9/12 - 144/91, 140/96 (AM and  PM) 9/14 - 152/110, 148/104 9/15 - 122/87, 136/94 9/16 - 139/107, 124/97 9/17 - 141/107  Her BP this morning on just the Benicar was 123/89. She reports her BP readings tend to be higher in clinic than home.  Current HTN meds: Benicar 40mg  daily Previously tried: amlodipine - fatigue and headaches. HCTZ - itching. Pt thinks the itching was from a different component of her medication rather than HCTZ. Can only see that she took Benicar-HCTZ but she is tolerating Benicar well. BP goal: <130/21mmHg   Family History: Father with hx of DM and HTN, mother with HTN and CHG, brother x3 with HTN and sister with HTN.   Social History: Denies tobacco use and illicit drug use. Occasional alcohol use.  Diet: Fruits, vegetables, oatmeal, eats fish and no other meat. Low sodium options, avoids white breads. Drinks water (80 oz per day) and beet juice. Stopped drinking coffee (was drinking 32 oz per day), does not like soda.   Exercise: Started doing cardio and walking at the gym - 30 minutes on the treadmill. Started back up with yoga classes as well.  Wt Readings from Last 3 Encounters:  10/16/16 184 lb (83.5 kg)  01/09/16 176 lb (79.8 kg)  12/30/14 224 lb (101.6 kg)   BP Readings from Last 3 Encounters:  10/20/16 (!) 178/113  10/16/16 (!) 160/110  01/09/16 (!) 160/102   Pulse Readings from Last 3 Encounters:  10/20/16 77  10/16/16 76  01/09/16 72    Renal function: CrCl cannot be calculated (Patient's most recent lab result is older than the maximum 21 days allowed.).  Past Medical History:  Diagnosis Date  . Fibroids   . Headache(784.0)   . HTN (hypertension)    no meds  . Iron deficiency anemia secondary to blood loss (chronic)   . Menorrhagia   . SVD (spontaneous vaginal delivery)    x 1    Current Outpatient Prescriptions on File Prior to Visit  Medication Sig Dispense Refill  . albuterol (PROVENTIL HFA;VENTOLIN HFA) 108 (90 BASE) MCG/ACT inhaler Inhale 2 puffs into the  lungs every 4 (four) hours as needed for wheezing or shortness of breath. 1 Inhaler 0  . amLODipine (NORVASC) 5 MG tablet Take 1 tablet (5 mg total) by mouth daily. 30 tablet 0  . Biotin 1 MG CAPS Take 1 mg by mouth daily.    . cetirizine (ZYRTEC) 10 MG tablet Take 10 mg by mouth daily as needed for allergies.    . cholecalciferol (VITAMIN D) 1000 units tablet Take 1,000 Units by mouth daily.    . Ferrous Sulfate (IRON) 325 (65 Fe) MG TABS Take 325 mg by mouth daily.    . fluticasone (FLONASE) 50 MCG/ACT nasal spray Place 1 spray into both nostrils daily as needed for allergies or rhinitis.    . Multiple Vitamin (MULTIVITAMIN) tablet Take 1 tablet by mouth daily.    Marland Kitchen olmesartan (BENICAR) 40 MG tablet Take 1 tablet (40 mg total) by mouth daily. 30 tablet 5  . Probiotic Product (PROBIOTIC-10 PO) Take 1 tablet by mouth daily.     No current facility-administered medications on file prior to visit.     Allergies  Allergen Reactions  . Hctz [Hydrochlorothiazide]     itching     Assessment/Plan:  1. Hypertension - BP improved but remains above goal <130/45mmHg. Pt is tolerating Benicar well, however the cost is prohibitive at $167 per month even for the generic. She does not wish to resume amlodipine since it worsened her headaches and caused fatigue. Discussed that her side effects (headache, fatigue, dizziness) may have been related to multiple medication changes in a short period of time as well as complete cessation of caffeine a few weeks ago. She is starting to feel better. Will check BMET today. If stable, will plan to switch Benicar to equivalent dose of irbesartan 300mg  daily which should be more affordable for pt. Will also plan to start chlorthalidone 25mg  daily. Pt recently resumed frequent exercise and encouraged her to continue with this as one of the best ways to lower her diastolic BP. Will f/u in HTN clinic in 2 weeks.   Megan E. Supple, PharmD, CPP, [facility] [address], [place], [place] [address] Phone: [phone]; Fax: [phone] 11/01/2016 10:34 AM

## 2016-11-01 NOTE — Patient Instructions (Addendum)
It was nice to meet you today! Your blood pressure goal is less than 130/80.  Finish taking your olmesartan (Benicar) 1 tablet each day  We will check your lab work today. If everything looks stable, we will plan to:  Stop olmesartan (Benicar) and replace it with irbesartan 300mg  daily. This is a similar medicine but should be cheaper. Start chlorthalidone 25mg  once daily.  I will call you on Monday with your lab results and a medication plan.  Follow up in blood pressure clinic in 2 weeks for lab work and blood pressure check.  Please bring in your home blood pressure cuff and readings to this visit.

## 2016-11-04 ENCOUNTER — Telehealth: Payer: Self-pay | Admitting: Pharmacist

## 2016-11-04 MED ORDER — CHLORTHALIDONE 25 MG PO TABS: 25.0000 mg | ORAL_TABLET | Freq: Every day | ORAL | 11 refills | Status: DC

## 2016-11-04 MED ORDER — IRBESARTAN 300 MG PO TABS: 300.0000 mg | ORAL_TABLET | Freq: Every day | ORAL | 11 refills | Status: DC

## 2016-11-04 NOTE — Telephone Encounter (Signed)
BMET stable after last HTN visit. Will switch Benicar to equivalent dose irbesartan 300mg  daily due to cost. Will start chlorthalidone 25mg  daily for better BP lowering. F/u in HTN clinic in 2 weeks for BMET and BP check. Pt is aware of plan.

## 2016-11-07 ENCOUNTER — Telehealth: Payer: Self-pay

## 2016-11-07 NOTE — Telephone Encounter (Signed)
No new med changes endorsed, continue monitoring.

## 2016-11-07 NOTE — Telephone Encounter (Signed)
Received 2 serious notifications from [employer].  1) auto triggered event from 9/26 0634. Strips showed SVT at rate of 180 bpm for 42 sec. There is no documentation patient or provider was contacted. 2) auto triggered event from 9/26 0641. Strips showed SVT at rate of 183 bpm for 33 sec. Documentation notes state the patient was asymptomatic at the gym standing.    Left message to call back to discuss monitor finding.

## 2016-11-07 NOTE — Telephone Encounter (Signed)
Can you please give it to EP for review? This ismost probably just sinus tachycardia then.

## 2016-11-07 NOTE — Telephone Encounter (Signed)
Please refer her to EP for evaluation of SVT. I'm not really sure what strips are these, is it from a Holter monitor or an event monitor? Who started that?

## 2016-11-07 NOTE — Telephone Encounter (Signed)
Its from an event monitor and she is still wearing it.  Its day 9 of 30.  She was exercising at the gym, and was asymptomatic with this episode.

## 2016-11-07 NOTE — Telephone Encounter (Signed)
Thank you :)

## 2016-11-07 NOTE — Telephone Encounter (Signed)
Dr Meda Coffee, showed Dr Caryl Comes the monitor and he said most likely sinus tachycardia.  Per Dr Caryl Comes, he did ask for the monitor tech to reprogram with longer pre/post.

## 2016-11-19 ENCOUNTER — Other Ambulatory Visit: Payer: Self-pay

## 2016-11-19 ENCOUNTER — Ambulatory Visit (INDEPENDENT_AMBULATORY_CARE_PROVIDER_SITE_OTHER): Payer: Self-pay | Admitting: Pharmacist

## 2016-11-19 VITALS — BP 120/82 | HR 83

## 2016-11-19 DIAGNOSIS — I10 Essential (primary) hypertension: Secondary | ICD-10-CM

## 2016-11-19 DIAGNOSIS — Z1231 Encounter for screening mammogram for malignant neoplasm of breast: Secondary | ICD-10-CM

## 2016-11-19 NOTE — Patient Instructions (Addendum)
It was nice to see you today  Continue to take your irbesartan and chlorthalidone each day  Monitor your blood pressure at home and call clinic if you notice readings consistently above 130/80

## 2016-11-19 NOTE — Progress Notes (Signed)
Patient ID: [name]                 DOB: 06-30-1965                      MRN: [id number]     HPI: Belinda Petersen is a 51 y.o. female patient of Dr Meda Coffee referred by Lyda Jester, PA to HTN clinic. PMH is significant for HTN, HLD, chest pain, and syncope. She was seen at the ED in [place] on 10/03/16 for syncope ? vasovagal episode, also found to be anemic. Pt reports she stopped taking all of her BP medications 1 year ago because she thought she could control her BP through diet and exercise. She had previously taken Benicar which she reportedly tolerated well. BP was elevated at OV on 10/16/16 to 160/100 and pt was resumed on previous dose of Benicar 40mg  daily. Pt presented to the ED 4 days later due to [place] [facility] of 170/110 with associated headache and "fogginess." Amlodipine 5mg  daily was started. Pt called clinic 9 days later reporting headache, sinus pressure, and dizziness that she associated with her BP medications. Her amlodipine was stopped and symptoms improved. Her Benicar was too expensive, so at last HTN visit, pt was switched to irbesartan and started on chlorthallidone for additional BP control. F/u BMET was stable.  Pt presents to clinic today in good spirits. She is feeling well and reports that her headaches have gone away. She denies dizziness or blurred vision. Her chlorthalidone and irbesartan are more affordable than the Benicar. Of note, her pharmacy did not have her insurance on file. I provided this to them so her future copays should be even cheaper. Pt recently purchased a wrist BP cuff and reports improved readings at home. Most recently, readings have been 110/84 and 123/85, which is an improvement from previous readings of 140-160/100. She took her blood pressure medications about 1 hour ago and has not had any caffeine. She would like to start drinking 1 cup of coffee each morning (used to drink 4-5 cups per day). Advised that this is fine.  Wrist  cuff reading in clinic: 149/104. On repeat 5 minutes later, 117/90. Clinic cuff reading: 120/82  Current HTN meds: irbesartan 300mg  daily, chlorthalidone 25mg  daily Previously tried: amlodipine - fatigue and headaches. HCTZ - itching. Pt thinks the itching was from a different component of her medication rather than HCTZ. Can only see that she took Benicar-HCTZ but she is tolerating Benicar well. BP goal: <130/55mmHg   Family History: Father with hx of DM and HTN, mother with HTN and CHG, brother x3 with HTN and sister with HTN.   Social History: Denies tobacco use and illicit drug use. Occasional alcohol use.  Diet: Fruits, vegetables, oatmeal, eats fish and no other meat. Low sodium options, avoids white breads. Drinks water (80 oz per day) and beet juice. Stopped drinking coffee (was drinking 32 oz per day), does not like soda.   Exercise: Started doing cardio and walking at the gym - 30 minutes on the treadmill. Started back up with yoga classes as well.   Wt Readings from Last 3 Encounters:  10/16/16 184 lb (83.5 kg)  01/09/16 176 lb (79.8 kg)  12/30/14 224 lb (101.6 kg)   BP Readings from Last 3 Encounters:  11/01/16 (!) 138/94  10/20/16 (!) 178/113  10/16/16 (!) 160/110   Pulse Readings from Last 3 Encounters:  11/01/16 80  10/20/16 77  10/16/16 76  Renal function: CrCl cannot be calculated (Unknown ideal weight.).  Past Medical History:  Diagnosis Date  . Fibroids   . Headache(784.0)   . HTN (hypertension)    no meds  . Iron deficiency anemia secondary to blood loss (chronic)   . Menorrhagia   . SVD (spontaneous vaginal delivery)    x 1    Current Outpatient Prescriptions on File Prior to Visit  Medication Sig Dispense Refill  . albuterol (PROVENTIL HFA;VENTOLIN HFA) 108 (90 BASE) MCG/ACT inhaler Inhale 2 puffs into the lungs every 4 (four) hours as needed for wheezing or shortness of breath. 1 Inhaler 0  . Biotin 1 MG CAPS Take 1 mg by mouth daily.      . cetirizine (ZYRTEC) 10 MG tablet Take 10 mg by mouth daily as needed for allergies.    . chlorthalidone (HYGROTON) 25 MG tablet Take 1 tablet (25 mg total) by mouth daily. 30 tablet 11  . cholecalciferol (VITAMIN D) 1000 units tablet Take 1,000 Units by mouth daily.    . Ferrous Sulfate (IRON) 325 (65 Fe) MG TABS Take 325 mg by mouth daily.    . fluticasone (FLONASE) 50 MCG/ACT nasal spray Place 1 spray into both nostrils daily as needed for allergies or rhinitis.    Marland Kitchen irbesartan (AVAPRO) 300 MG tablet Take 1 tablet (300 mg total) by mouth daily. 30 tablet 11  . Multiple Vitamin (MULTIVITAMIN) tablet Take 1 tablet by mouth daily.    . Probiotic Product (PROBIOTIC-10 PO) Take 1 tablet by mouth daily.     No current facility-administered medications on file prior to visit.     Allergies  Allergen Reactions  . Hctz [Hydrochlorothiazide]     itching     Assessment/Plan:  1. Hypertension - BP much improved and is now at goal <130/69mmHg on irbesartan 300mg  daily and chlorthalidone 25mg  daily. Pt reports tolerating medications well and BMET is stable. Will continue current medications. Counseled pt on proper technique using wrist blood pressure cuff. Advised pt to monitor BP at home and contact clinic if readings become elevated > 130/80. F/u in HTN clinic as needed. Pt does have scheduled f/u with Lyda Jester later this month and will keep appt.   Belinda Petersen, PharmD, CPP, [facility] [address], [place], [place] [address] Phone: [phone]; Fax: [phone] 11/19/2016 10:08 AM

## 2016-12-04 ENCOUNTER — Encounter: Payer: Self-pay | Admitting: Cardiology

## 2016-12-04 ENCOUNTER — Ambulatory Visit (INDEPENDENT_AMBULATORY_CARE_PROVIDER_SITE_OTHER): Payer: No Typology Code available for payment source | Admitting: Cardiology

## 2016-12-04 VITALS — BP 124/80 | HR 87 | Ht 63.0 in | Wt 179.9 lb

## 2016-12-04 DIAGNOSIS — I10 Essential (primary) hypertension: Secondary | ICD-10-CM | POA: Diagnosis not present

## 2016-12-04 NOTE — Patient Instructions (Addendum)
Medication Instructions: Your physician recommends that you continue on your current medications as directed. Please refer to the Current Medication list given to you today.  Labwork: None Ordered  Procedures/Testing: None Ordered  Follow-Up: Your physician wants you to follow-up in: 6 MONTHS with Dr. Meda Coffee. You will receive a reminder letter in the mail two months in advance. If you don't receive a letter, please call our office to schedule the follow-up appointment.  Any Additional Special Instructions Will Be Listed Below (If Applicable).  --If you  Have any palpitations or syncope (passing out) please call out office and we can arrange follow up with one of our Electrophysiologist.  If you need a refill on your cardiac medications before your next appointment, please call your pharmacy.

## 2016-12-04 NOTE — Telephone Encounter (Signed)
Patient states she wants to cancel her sleep study and call back to reschedule.

## 2016-12-04 NOTE — Progress Notes (Signed)
12/04/2016 [name]   12-01-1965  [id number]  Primary Physician Patient, No Pcp Per Primary Cardiologist: Dr. Meda Coffee   Reason for Visit/CC: F/u for Syncope and HTN  HPI:  Belinda Petersen is a 51 y.o. female who presents back to clinic for f/u for syncope and hypertension.   Her PMH is notable for HTN and HLD. She was evaluated in the past for chest pain. 2 visits to the ER, normal EKG, normal enzymes, no regional wall motion abnormalities on the echocardiogram with preserved LVEF, and normal stress test in September 2015.   I evaluated her 10/16/16 for post ED visit for syncope. Pt's episode was witness by her husband. Brief LOC while sitting followed by emesis. This occurred while out to eat dinner at a restaurant. Pt felt fine earlier that day. She ate her appetizer, her main meal and a glass of wine, then suddenly felt unwell and hot. She took a sip of water and felt a bit nauseated. She notes her vision got hazy. Her husband said that she did not look well and looked like she was going to pass out. There was LOC for about 1 min. Her husband said that she started snoring. No seizure-like activity. Pt denies any associated CP, dyspnea or palpitations. No extremity weakness or numbness. Pt was taken to local ER in [place]. Labs were notable for anemia with Hgb ~9, which the pt reports is her baseline (menorrhagia from uterine fibroids). BMP was WNL. No signs of dehydration or electrolyte abnormalities. Troponins were negative. CXR unremarkable. It is mentioned in note that EKG showed questionable bifascicular block, however EKG was not faxed to our office. Her BP was elevated at 165/108.Given her abnormal EKG, she was advised to f/u in our office for further w/u and to also f/u with her PCP regarding her anemia.   I ordered a 2D echo and 30 day monitor. 2D echo was normal with normal LVEF and wall motion. No valvular abnormalities. Monitor was notable for 3 episodes of PSVT. Longest  episode was 43 sec. Labs were also obtained. Hgb was 9 c/w baseline. Vit D level was also mildly low.   In addition, her BP regimen was adjusted. She is now on irbesartan 300 mg and chlorthalidone 25 mg. She has been seen in f/u by the HTN clinic and doing well with these agents. F/u BMP on 11/01/16 showed normal renal function and K. BP is better controlled and at goal of <130/80.   She presents back to clinic for f/u. She notes that she feels significantly better. BP is well controlled at 124/80. Tolerating meds w/o side effects. Her energy levels are improved. Less fatigue. She denies any further syncope/ near syncope. No palpitations. She reports compliance with OTC Fe supplements and Vit D.     Current Meds  Medication Sig  . albuterol (PROVENTIL HFA;VENTOLIN HFA) 108 (90 BASE) MCG/ACT inhaler Inhale 2 puffs into the lungs every 4 (four) hours as needed for wheezing or shortness of breath.  . Biotin 1 MG CAPS Take 1 mg by mouth daily.  . cetirizine (ZYRTEC) 10 MG tablet Take 10 mg by mouth daily as needed for allergies.  . chlorthalidone (HYGROTON) 25 MG tablet Take 1 tablet (25 mg total) by mouth daily.  . cholecalciferol (VITAMIN D) 1000 units tablet Take 1,000 Units by mouth daily.  . Ferrous Sulfate (IRON) 325 (65 Fe) MG TABS Take 325 mg by mouth daily.  . fluticasone (FLONASE) 50 MCG/ACT nasal spray Place 1 spray into  both nostrils daily as needed for allergies or rhinitis.  Marland Kitchen irbesartan (AVAPRO) 300 MG tablet Take 1 tablet (300 mg total) by mouth daily.  . Multiple Vitamin (MULTIVITAMIN) tablet Take 1 tablet by mouth daily.  . Probiotic Product (PROBIOTIC-10 PO) Take 1 tablet by mouth daily.   Allergies  Allergen Reactions  . Hctz [Hydrochlorothiazide]     itching   Past Medical History:  Diagnosis Date  . Fibroids   . Headache(784.0)   . HTN (hypertension)    no meds  . Iron deficiency anemia secondary to blood loss (chronic)   . Menorrhagia   . SVD (spontaneous vaginal  delivery)    x 1   Family History  Problem Relation Age of Onset  . Diabetes Father   . Hypertension Father   . Lung cancer Father   . Hypertension Mother   . Congestive Heart Failure Mother   . Hypertension Brother   . Hypertension Brother   . Hypertension Brother   . Hypertension Sister   . Heart disease Brother   . Heart attack Brother   . Breast cancer Paternal Grandmother   . Heart disease Maternal Grandmother        pacemaker  . Hypertension Maternal Grandmother   . Lung cancer Paternal Grandfather    Past Surgical History:  Procedure Laterality Date  . DILITATION & CURRETTAGE/HYSTROSCOPY WITH VERSAPOINT RESECTION N/A 06/08/2012   Procedure: DILATATION & CURETTAGE/HYSTEROSCOPY WITH VERSAPOINT RESECTION;  Surgeon: Lyman Speller, MD;  Location: [facility];  Service: Gynecology;  Laterality: N/A;  . WISDOM TOOTH EXTRACTION     Social History   Social History  . Marital status: Married    Spouse name: N/A  . Number of children: 1  . Years of education: N/A   Occupational History  .  [employer]   Social History Main Topics  . Smoking status: Never Smoker  . Smokeless tobacco: Never Used  . Alcohol use 0.0 oz/week     Comment: 1 glass twice a month  . Drug use: No  . Sexual activity: Yes    Partners: Male    Birth control/ protection: None   Other Topics Concern  . Not on file   Social History Narrative  . No narrative on file     Review of Systems: General: negative for chills, fever, night sweats or weight changes.  Cardiovascular: negative for chest pain, dyspnea on exertion, edema, orthopnea, palpitations, paroxysmal nocturnal dyspnea or shortness of breath Dermatological: negative for rash Respiratory: negative for cough or wheezing Urologic: negative for hematuria Abdominal: negative for nausea, vomiting, diarrhea, bright red blood per rectum, melena, or hematemesis Neurologic: negative for visual changes, syncope, or dizziness All  other systems reviewed and are otherwise negative except as noted above.   Physical Exam:  Blood pressure 124/80, pulse 87, height 5\' 3"  (1.6 m), weight 179 lb 14.4 oz (81.6 kg), SpO2 99 %.  General appearance: alert, cooperative and no distress Neck: no carotid bruit and no JVD Lungs: clear to auscultation bilaterally Heart: regular rate and rhythm, S1, S2 normal, no murmur, click, rub or gallop Extremities: extremities normal, atraumatic, no cyanosis or edema Pulses: 2+ and symmetric Skin: Skin color, texture, turgor normal. No rashes or lesions Neurologic: Grossly normal  EKG not performed -- personally reviewed   ASSESSMENT AND PLAN:   1. Syncope: no recurrent episodes since event in [place] 09/2016, which based on story was likely vasovagal syncope. 2D echo is normal with normal LVEF and wall motion.  No valvular abnormalities. 30 day monitor showed PSVT, longest duration 43 sec. No further w/u planned at this time.   2. PSVT: detected on monitor. Pt denies any recent palpitations, recurrent syncope or near syncope and no fatigue, dyspnea or CP. Pt instructed to stay well hydrated and to avoid triggers such as excess caffeine or alcohol. In telephone note from 11/07/16, Dr. Meda Coffee noted to consider referral to EP. Given lack of recent symptoms, we will monitor for now. Pt advised to contact our office if any symptoms of palpitations, dyspnea, dizziness etc. And we can place referral to EP.    3. HTN: well controlled on current regimen. Stable renal function and K Continue ARB and diuretic. Repeat BMP in 6 months.    Follow-Up w/ Dr. Meda Coffee in 6 months.   Belinda Petersen, MHS [facility] 12/04/2016 8:42 AM

## 2016-12-05 ENCOUNTER — Ambulatory Visit (HOSPITAL_COMMUNITY)
Admission: EM | Admit: 2016-12-05 | Discharge: 2016-12-05 | Disposition: A | Discharge status: Home / Self Care | Payer: No Typology Code available for payment source | Attending: Family Medicine | Admitting: Family Medicine

## 2016-12-05 ENCOUNTER — Encounter (HOSPITAL_COMMUNITY): Payer: Self-pay | Admitting: Emergency Medicine

## 2016-12-05 DIAGNOSIS — L509 Urticaria, unspecified: Secondary | ICD-10-CM

## 2016-12-05 MED ORDER — PREDNISONE 10 MG (21) PO TBPK: ORAL_TABLET | Freq: Every day | ORAL | 0 refills | Status: DC

## 2016-12-05 NOTE — ED Triage Notes (Addendum)
Reports onset Tuesday of symptoms, symptoms resolved with zyrtec.  Symptoms occurred today, zyrtec did not help, did take benadryl.  Patient is currently trying new blood pressure medicines.  General itching  No issues with swallowing or breathing

## 2016-12-06 ENCOUNTER — Telehealth: Payer: Self-pay | Admitting: Cardiology

## 2016-12-06 MED ORDER — OLMESARTAN MEDOXOMIL 40 MG PO TABS: 40.0000 mg | ORAL_TABLET | Freq: Every day | ORAL | 1 refills | Status: DC

## 2016-12-06 NOTE — Telephone Encounter (Signed)
Spoke with [employer] Pharmacist and endorsed the pts issues with irbesartan.  Per Jinny Blossom, she ordered that the pt stop taking irbesartan and start back taking Olmesartan (Benicar) 40 mg po daily.  Updated irbesartan in the pts med list as an allergy that caused hives.  Informed the pt that per [facility], she should stop Irbesartan now and start back taking Olmesartan 40 mg po daily.  Confirmed the pharmacy of choice with the pt.  Note placed to pharmacy to not fill benicar until pt calls for further refills, for she has a supply of this med on hand. Pt verbalized understanding and agrees with this plan.

## 2016-12-06 NOTE — Telephone Encounter (Signed)
Leeroy Bock, [facility]      11/04/16 8:54 AM  Note    BMET stable after last HTN visit. Will switch Benicar to equivalent dose irbesartan 300mg  daily due to cost. Will start chlorthalidone 25mg  daily for better BP lowering. F/u in HTN clinic in 2 weeks for BMET and BP check. Pt is aware of plan.       Pt is calling into the office to inform both Dr Meda Coffee and [facility] [place] Pharmacist that ever since she was switched from benicar to irbesartan, she has been having itching and hives.  Pt states that she is very confident that its the irbesartan causing the issue, for she states she's been taking chlorthalidone since 9/24, and has had zero issues with this med.  Pt states that her hives were so bad yesterday, she had to go to Urgent care, and they advised her to call us today, to discuss changing her irbesartan to a different regimen. Pt states she would now like to be switched back to olmesartan (benicar), for she tolerates it better and she just found out that the cost of this med will be much lower than before.  Pt states she was originally taken off benicar and placed on irbesartan, due to cost.  Pt states her carrier will now cover benicar at a cheaper cost.  Advised the pt to stop taking irbesartan now, and I will go and speak with Jinny Blossom Supple about her allergy to irbesartan and to re-dose and provide new order for her to start back taking benicar.  Pt verbalized understanding and agrees with this plan.

## 2016-12-06 NOTE — Telephone Encounter (Signed)
New message     Pt confused about medication she has 2 prescriptions she just picked up from the pharmacy :   irbesartan (AVAPRO) 300 MG tablet Take 1 tablet (300 mg total) by mouth daily   olmesartan 11/12/16 was date filled   is she suppose to be taking both of this or is one a mistake?

## 2016-12-10 NOTE — ED Provider Notes (Signed)
  Belinda Petersen   [id number] 12/05/16 Arrival Time: 4098  ASSESSMENT & PLAN:  1. Hives     Meds ordered this encounter  Medications  . predniSONE (STERAPRED UNI-PAK 21 TAB) 10 MG (21) TBPK tablet    Sig: Take by mouth daily. Take as directed.    Dispense:  21 tablet    Refill:  0   Benadryl OTC if needed. F/U tomorrow morning if not showing significant improvement.  Reviewed expectations re: course of current medical issues. Questions answered. Outlined signs and symptoms indicating need for more acute intervention. Patient verbalized understanding. After Visit Summary given.   SUBJECTIVE:  Belinda Petersen is a 51 y.o. female who presents with complaint of itchy rash over torso and extremities. Gradual onset over a few days but more itching today. H/O similar in past but mild symptoms that resolved with OTC anti-histamine. No SOB/CP/n/v. Normal PO intake.  ROS: As per HPI.  OBJECTIVE: Vitals:   12/05/16 1435  BP: (!) 151/101  Pulse: 84  Resp: 16  Temp: 98.4 F (36.9 C)  TempSrc: Oral  SpO2: 100%    General appearance: alert; no distress Lungs: clear to auscultation bilaterally Heart: regular rate and rhythm Extremities: no edema Skin: warm and dry; multiple wheals over trunk and extremities Psychological: alert and cooperative; normal mood and affect   Allergies  Allergen Reactions  . Hctz [Hydrochlorothiazide]     itching  . Irbesartan Hives    Pt reports causes "hives."    Past Medical History:  Diagnosis Date  . Fibroids   . Headache(784.0)   . HTN (hypertension)    no meds  . Iron deficiency anemia secondary to blood loss (chronic)   . Menorrhagia   . SVD (spontaneous vaginal delivery)    x 1   Social History   Social History  . Marital status: Married    Spouse name: N/A  . Number of children: 1  . Years of education: N/A   Occupational History  .  [employer]   Social History Main Topics  . Smoking status:  Never Smoker  . Smokeless tobacco: Never Used  . Alcohol use 0.0 oz/week     Comment: 1 glass twice a month  . Drug use: No  . Sexual activity: Yes    Partners: Male    Birth control/ protection: None   Other Topics Concern  . Not on file   Social History Narrative  . No narrative on file   Family History  Problem Relation Age of Onset  . Diabetes Father   . Hypertension Father   . Lung cancer Father   . Hypertension Mother   . Congestive Heart Failure Mother   . Hypertension Brother   . Hypertension Brother   . Hypertension Brother   . Hypertension Sister   . Heart disease Brother   . Heart attack Brother   . Breast cancer Paternal Grandmother   . Heart disease Maternal Grandmother        pacemaker  . Hypertension Maternal Grandmother   . Lung cancer Paternal Grandfather    Past Surgical History:  Procedure Laterality Date  . DILITATION & CURRETTAGE/HYSTROSCOPY WITH VERSAPOINT RESECTION N/A 06/08/2012   Procedure: DILATATION & CURETTAGE/HYSTEROSCOPY WITH VERSAPOINT RESECTION;  Surgeon: Lyman Speller, MD;  Location: [facility];  Service: Gynecology;  Laterality: N/A;  . WISDOM TOOTH EXTRACTION       Vanessa Kick, MD 12/10/16 1000

## 2016-12-11 ENCOUNTER — Encounter (HOSPITAL_BASED_OUTPATIENT_CLINIC_OR_DEPARTMENT_OTHER): Payer: PRIVATE HEALTH INSURANCE

## 2016-12-11 ENCOUNTER — Ambulatory Visit
Admission: RE | Admit: 2016-12-11 | Discharge: 2016-12-11 | Disposition: A | Discharge status: Home / Self Care | Payer: No Typology Code available for payment source | Source: Ambulatory Visit

## 2016-12-11 DIAGNOSIS — Z1231 Encounter for screening mammogram for malignant neoplasm of breast: Secondary | ICD-10-CM

## 2017-01-06 ENCOUNTER — Telehealth: Payer: Self-pay | Admitting: Pharmacist

## 2017-01-06 NOTE — Telephone Encounter (Signed)
Pt called to report that olmesartan price has increased this month. She reports that she paid $30 last month and this month the cost is $180. Spoke with [facility] and they report cost increase because insurance has changed and they do not have current insurance information. Spoke to patient and she states she will call and give them correct information. She will call if still unaffordable.

## 2017-01-09 ENCOUNTER — Other Ambulatory Visit: Payer: Self-pay

## 2017-01-09 ENCOUNTER — Encounter: Payer: Self-pay | Admitting: Obstetrics and Gynecology

## 2017-01-09 ENCOUNTER — Ambulatory Visit: Payer: Self-pay | Admitting: Certified Nurse Midwife

## 2017-01-09 ENCOUNTER — Ambulatory Visit (INDEPENDENT_AMBULATORY_CARE_PROVIDER_SITE_OTHER): Payer: No Typology Code available for payment source | Admitting: Obstetrics and Gynecology

## 2017-01-09 VITALS — BP 142/78 | HR 80 | Resp 16 | Ht 63.0 in | Wt 187.0 lb

## 2017-01-09 DIAGNOSIS — N920 Excessive and frequent menstruation with regular cycle: Secondary | ICD-10-CM

## 2017-01-09 DIAGNOSIS — Z01419 Encounter for gynecological examination (general) (routine) without abnormal findings: Secondary | ICD-10-CM

## 2017-01-09 DIAGNOSIS — D500 Iron deficiency anemia secondary to blood loss (chronic): Secondary | ICD-10-CM | POA: Diagnosis not present

## 2017-01-09 DIAGNOSIS — D259 Leiomyoma of uterus, unspecified: Secondary | ICD-10-CM

## 2017-01-09 DIAGNOSIS — Z1211 Encounter for screening for malignant neoplasm of colon: Secondary | ICD-10-CM | POA: Diagnosis not present

## 2017-01-09 NOTE — Patient Instructions (Signed)
EXERCISE AND DIET:  We recommended that you start or continue a regular exercise program for good health. Regular exercise means any activity that makes your heart beat faster and makes you sweat.  We recommend exercising at least 30 minutes per day at least 3 days a week, preferably 4 or 5.  We also recommend a diet low in fat and sugar.  Inactivity, poor dietary choices and obesity can cause diabetes, heart attack, stroke, and kidney damage, among others.    ALCOHOL AND SMOKING:  Women should limit their alcohol intake to no more than 7 drinks/beers/glasses of wine (combined, not each!) per week. Moderation of alcohol intake to this level decreases your risk of breast cancer and liver damage. And of course, no recreational drugs are part of a healthy lifestyle.  And absolutely no smoking or even second hand smoke. Most people know smoking can cause heart and lung diseases, but did you know it also contributes to weakening of your bones? Aging of your skin?  Yellowing of your teeth and nails?  CALCIUM AND VITAMIN D:  Adequate intake of calcium and Vitamin D are recommended.  The recommendations for exact amounts of these supplements seem to change often, but generally speaking 600 mg of calcium (either carbonate or citrate) and 800 units of Vitamin D per day seems prudent. Certain women may benefit from higher intake of Vitamin D.  If you are among these women, your doctor will have told you during your visit.    PAP SMEARS:  Pap smears, to check for cervical cancer or precancers,  have traditionally been done yearly, although recent scientific advances have shown that most women can have pap smears less often.  However, every woman still should have a physical exam from her gynecologist every year. It will include a breast check, inspection of the vulva and vagina to check for abnormal growths or skin changes, a visual exam of the cervix, and then an exam to evaluate the size and shape of the uterus and  ovaries.  And after 50 years of age, a rectal exam is indicated to check for rectal cancers. We will also provide age appropriate advice regarding health maintenance, like when you should have certain vaccines, screening for sexually transmitted diseases, bone density testing, colonoscopy, mammograms, etc.   MAMMOGRAMS:  All women over 40 years old should have a yearly mammogram. Many facilities now offer a "3D" mammogram, which may cost around $50 extra out of pocket. If possible,  we recommend you accept the option to have the 3D mammogram performed.  It both reduces the number of women who will be called back for extra views which then turn out to be normal, and it is better than the routine mammogram at detecting truly abnormal areas.    COLONOSCOPY:  Colonoscopy to screen for colon cancer is recommended for all women at age 50.  We know, you hate the idea of the prep.  We agree, BUT, having colon cancer and not knowing it is worse!!  Colon cancer so often starts as a polyp that can be seen and removed at colonscopy, which can quite literally save your life!  And if your first colonoscopy is normal and you have no family history of colon cancer, most women don't have to have it again for 10 years.  Once every ten years, you can do something that may end up saving your life, right?  We will be happy to help you get it scheduled when you are ready.    Be sure to check your insurance coverage so you understand how much it will cost.  It may be covered as a preventative service at no cost, but you should check your particular policy.      Uterine Fibroids Uterine fibroids are tissue masses (tumors) that can develop in the womb (uterus). They are also called leiomyomas. This type of tumor is not cancerous (benign) and does not spread to other parts of the body outside of the pelvic area, which is between the hip bones. Occasionally, fibroids may develop in the fallopian tubes, in the cervix, or on the support  structures (ligaments) that surround the uterus. You can have one or many fibroids. Fibroids can vary in size, weight, and where they grow in the uterus. Some can become quite large. Most fibroids do not require medical treatment. What are the causes? A fibroid can develop when a single uterine cell keeps growing (replicating). Most cells in the human body have a control mechanism that keeps them from replicating without control. What are the signs or symptoms? Symptoms may include:  Heavy bleeding during your period.  Bleeding or spotting between periods.  Pelvic pain and pressure.  Bladder problems, such as needing to urinate more often (urinary frequency) or urgently.  Inability to reproduce offspring (infertility).  Miscarriages.  How is this diagnosed? Uterine fibroids are diagnosed through a physical exam. Your health care provider may feel the lumpy tumors during a pelvic exam. Ultrasonography and an MRI may be done to determine the size, location, and number of fibroids. How is this treated? Treatment may include:  Watchful waiting. This involves getting the fibroid checked by your health care provider to see if it grows or shrinks. Follow your health care provider's recommendations for how often to have this checked.  Hormone medicines. These can be taken by mouth or given through an intrauterine device (IUD).  Surgery. ? Removing the fibroids (myomectomy) or the uterus (hysterectomy). ? Removing blood supply to the fibroids (uterine artery embolization).  If fibroids interfere with your fertility and you want to become pregnant, your health care provider may recommend having the fibroids removed. Follow these instructions at home:  Keep all follow-up visits as directed by your health care provider. This is important.  Take over-the-counter and prescription medicines only as told by your health care provider. ? If you were prescribed a hormone treatment, take the  hormone medicines exactly as directed.  Ask your health care provider about taking iron pills and increasing the amount of dark green, leafy vegetables in your diet. These actions can help to boost your blood iron levels, which may be affected by heavy menstrual bleeding.  Pay close attention to your period and tell your health care provider about any changes, such as: ? Increased blood flow that requires you to use more pads or tampons than usual per month. ? A change in the number of days that your period lasts per month. ? A change in symptoms that are associated with your period, such as abdominal cramping or back pain. Contact a health care provider if:  You have pelvic pain, back pain, or abdominal cramps that cannot be controlled with medicines.  You have an increase in bleeding between and during periods.  You soak tampons or pads in a half hour or less.  You feel lightheaded, extra tired, or weak. Get help right away if:  You faint.  You have a sudden increase in pelvic pain. This information is not intended to replace   advice given to you by your health care provider. Make sure you discuss any questions you have with your health care provider. Document Released: 01/26/2000 Document Revised: 09/28/2015 Document Reviewed: 07/27/2013 Elsevier Interactive Patient Education  2018 Elsevier Inc.  

## 2017-01-09 NOTE — Progress Notes (Signed)
51 y.o. G1P0101 MarriedAfrican AmericanF here for annual exam.   She is anemic (chronically) Hgb from 2 months ago was 9.9. Normal TSH She has a h/o a hysteroscopic myomectomy in 2014. Cycles did get better after the surgery, slowly getting worse. She can bleed through a super tampon in up to one hour at times. Cramps are tolerable, midol. 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  Mild hot flashes tolerable, no sweats. Not sleeping as well. No dyspareunia.  Period Cycle (Days): 28 Period Duration (Days): 5 Period Pattern: Regular Menstrual Flow: Heavy Menstrual Control: Tampon, Thin pad Menstrual Control Change Freq (Hours): 1-2 Dysmenorrhea: (!) Moderate Dysmenorrhea Symptoms: Cramping, Headache  She had a Syncopal event in the summer, had an abnormal EKG and was noted to have elevated BP. She was just started on medication this summer for HTN. Testing was normal.   Patient's last menstrual period was 12/23/2016.          Sexually active: Yes.    The current method of family planning is none.    Exercising: No.  The patient does not participate in regular exercise at present. Smoker:  no  Health Maintenance: Pap:   12/30/14, Negative   History of abnormal Pap:  no MMG:  12/11/16 BIRADS 1 negative/density c Colonoscopy:  none BMD:   none TDaP:  Unsure Gardasil: n/a   reports that  has never smoked. she has never used smokeless tobacco. She reports that she drinks alcohol. She reports that she does not use drugs. Just occasional ETOH. She is a [employer] [place] in [employer]. She moved here from [place], husband is a Theme park manager. Son is college in [employer], Paramedic.   Past Medical History:  Diagnosis Date  . Fibroids   . Headache(784.0)   . HTN (hypertension)    no meds  . Iron deficiency anemia secondary to blood loss (chronic)   . Menorrhagia   . SVD (spontaneous vaginal delivery)    x 1    Past Surgical History:  Procedure Laterality Date  . DILITATION &  CURRETTAGE/HYSTROSCOPY WITH VERSAPOINT RESECTION N/A 06/08/2012   Procedure: DILATATION & CURETTAGE/HYSTEROSCOPY WITH VERSAPOINT RESECTION;  Surgeon: Lyman Speller, MD;  Location: [facility];  Service: Gynecology;  Laterality: N/A;  . WISDOM TOOTH EXTRACTION    Son was a vaginal delivery at 102 weeks.   Current Outpatient Medications  Medication Sig Dispense Refill  . albuterol (PROVENTIL HFA;VENTOLIN HFA) 108 (90 BASE) MCG/ACT inhaler Inhale 2 puffs into the lungs every 4 (four) hours as needed for wheezing or shortness of breath. 1 Inhaler 0  . Biotin 1 MG CAPS Take 1 mg by mouth daily.    . cetirizine (ZYRTEC) 10 MG tablet Take 10 mg by mouth daily as needed for allergies.    . chlorthalidone (HYGROTON) 25 MG tablet Take 1 tablet (25 mg total) by mouth daily. 30 tablet 11  . cholecalciferol (VITAMIN D) 1000 units tablet Take 1,000 Units by mouth daily.    . Ferrous Sulfate (IRON) 325 (65 Fe) MG TABS Take 325 mg by mouth daily.    . fluticasone (FLONASE) 50 MCG/ACT nasal spray Place 1 spray into both nostrils daily as needed for allergies or rhinitis.    . Multiple Vitamin (MULTIVITAMIN) tablet Take 1 tablet by mouth daily.    Marland Kitchen olmesartan (BENICAR) 40 MG tablet Take 1 tablet (40 mg total) by mouth daily. 90 tablet 1  . Probiotic Product (PROBIOTIC-10 PO) Take 1 tablet by mouth daily.     No  current facility-administered medications for this visit.     Family History  Problem Relation Age of Onset  . Diabetes Father   . Hypertension Father   . Lung cancer Father   . Hypertension Mother   . Congestive Heart Failure Mother   . Hypertension Brother   . Hypertension Brother   . Hypertension Brother   . Hypertension Sister   . Heart disease Brother   . Heart attack Brother   . Breast cancer Paternal Grandmother   . Heart disease Maternal Grandmother        pacemaker  . Hypertension Maternal Grandmother   . Lung cancer Paternal Grandfather     Review of Systems  Constitutional:  Negative.   HENT: Negative.   Eyes: Negative.   Respiratory: Negative.   Cardiovascular: Negative.   Gastrointestinal: Positive for blood in stool.       Change in stool  Endocrine: Negative.   Genitourinary:       Vulvar/vaginal itching  Musculoskeletal: Negative.   Skin: Negative.   Allergic/Immunologic: Negative.   Neurological: Negative.   Hematological: Bruises/bleeds easily.  Psychiatric/Behavioral: Negative.     Exam:   BP (!) 142/78 (BP Location: Right Arm, Patient Position: Sitting, Cuff Size: Large)   Pulse 80   Resp 16   Ht 5\' 3"  (1.6 m)   Wt 187 lb (84.8 kg)   LMP 12/23/2016   BMI 33.13 kg/m   Weight change: @WEIGHTCHANGE @ Height:   Height: 5\' 3"  (160 cm)  Ht Readings from Last 3 Encounters:  01/09/17 5\' 3"  (1.6 m)  12/04/16 5\' 3"  (1.6 m)  10/16/16 5\' 3"  (1.6 m)    General appearance: alert, cooperative and appears stated age Head: Normocephalic, without obvious abnormality, atraumatic Neck: no adenopathy, supple, symmetrical, trachea midline and thyroid normal to inspection and palpation Lungs: clear to auscultation bilaterally Cardiovascular: regular rate and rhythm Breasts: normal appearance, no masses or tenderness, left nipple is inverted (she states always) Abdomen: soft, non-tender; non distended,  no masses,  no organomegaly Extremities: extremities normal, atraumatic, no cyanosis or edema Skin: Skin color, texture, turgor normal. No rashes or lesions Lymph nodes: Cervical, supraclavicular, and axillary nodes normal. No abnormal inguinal nodes palpated Neurologic: Grossly normal   Pelvic: External genitalia:  no lesions              Urethra:  normal appearing urethra with no masses, tenderness or lesions              Bartholins and Skenes: normal                 Vagina: normal appearing vagina with normal color and discharge, no lesions              Cervix: large nabothian cyst, os deviated to the patient's right               Bimanual Exam:   Uterus:  irregular, slightly enlarged, mobile, not tender              Adnexa: no mass, fullness, tenderness               Rectovaginal: Confirms               Anus:  normal sphincter tone, no lesions  Chaperone was present for exam.  A:  Well Woman with normal exam  Menorrhagia  H/O fibroid uterus and hysteroscopic resection  Anemia   P:   Labs are UTD  Pap next year  Mammogram  UTD  Will set up colonosocpy  Discussed breast self exam  Discussed calcium and vit D intake  Return for GYN ultrasound  Discussed possible mirena IUD  She could also consider the mini-pill

## 2017-01-10 ENCOUNTER — Ambulatory Visit: Payer: Self-pay | Admitting: Nurse Practitioner

## 2017-02-17 ENCOUNTER — Telehealth: Payer: Self-pay | Admitting: Obstetrics and Gynecology

## 2017-02-17 NOTE — Telephone Encounter (Signed)
Call placed to patient to follow up with current insurance information and to schedule recommended sonohysterogram with endometrial biopsy. Spoke with patient on 01/13/17 (see referral notes)to advise insurance information on file terminated on 12/11/16, patient was to call back new insurance information for precertification and to schedule. Left voicemail message requesting a return call.  Routing to Dr Talbert Nan  cc: Lamont Snowball, RN

## 2017-02-24 ENCOUNTER — Encounter: Payer: Self-pay | Admitting: Obstetrics and Gynecology

## 2017-02-27 NOTE — Telephone Encounter (Signed)
See previous phone note. Call placed to patient to obtained new insurance information and to scheduled the recommended sonohysterogram and endometrial biopsy. Left voicemail message requesting a return call.   Routing to Dr Talbert Nan  cc: Lamont Snowball, RN

## 2017-03-19 NOTE — Telephone Encounter (Signed)
Call to patient. States she is ready to proceed. Needs to call after work with new insurance information. Will call back after 2pm.

## 2017-04-08 ENCOUNTER — Telehealth: Payer: Self-pay | Admitting: Obstetrics and Gynecology

## 2017-04-08 NOTE — Telephone Encounter (Signed)
Call placed to patient to follow up in regards to obtaining new insurance information to schedule recommended sonohysterogram

## 2017-04-14 NOTE — Telephone Encounter (Signed)
See previous phone notes. Call placed to patient to follow up on obtaining new insurance information, to pre-certify recommended sonohysterogram. Left voicemail message requesting a return call.    cc: Dr Talbert Nan  cc: Lamont Snowball

## 2017-04-16 NOTE — Telephone Encounter (Signed)
Call placed to patient to follow up regarding recommended appointment. See previous messages. Left voicemail message requesting a return call for scheduling

## 2017-04-17 NOTE — Telephone Encounter (Signed)
Call placed to patient to follow up regarding recommended appointment. See previous messages. Left voicemail message requesting patient to return call to [place], but advised in message if I'm not available, to please ask for the Nursing Supervisor, Gay Filler.    cc: Lamont Snowball

## 2017-04-17 NOTE — Telephone Encounter (Signed)
See next encounter for additional attempts to reach patient.  Routing to provider for final review. Patient agreeable to disposition. Will close encounter.

## 2017-04-21 ENCOUNTER — Telehealth: Payer: Self-pay | Admitting: Obstetrics and Gynecology

## 2017-04-21 ENCOUNTER — Encounter: Payer: Self-pay | Admitting: *Deleted

## 2017-04-21 NOTE — Telephone Encounter (Signed)
No response from patient. Multiple attempts over several phone encounters. Letter to your office for review.

## 2017-04-22 NOTE — Telephone Encounter (Signed)
Letter reviewed and signed by Dr Talbert Nan. Mailed certified and regular [place] mail.   Encounter closed.

## 2017-05-07 NOTE — Telephone Encounter (Signed)
Patient called and said she received a letter reminding her she needs to schedule an ultrasound. The patient said her insurance coverage will not be in effect until 06/17/17. She's like to know if it's okay to wait that long.

## 2017-05-08 NOTE — Telephone Encounter (Signed)
On 01/09/2017 PUS was recommended due to menorrhagia and dysmenorrhea. Has a history of fibroids. Patient is unable to have PUS until 06/2017 due to insurance. Routing to [facility] for review.

## 2017-05-08 NOTE — Telephone Encounter (Signed)
Please schedule in May, 19

## 2017-05-08 NOTE — Telephone Encounter (Signed)
Appointment scheduled for 06/17/2017 at 1:30pm with 2 pm consult with Dr.Jertson. Patient is agreeable to date and time. Encounter closed.

## 2017-06-17 ENCOUNTER — Other Ambulatory Visit: Payer: No Typology Code available for payment source | Admitting: Obstetrics and Gynecology

## 2017-06-17 ENCOUNTER — Other Ambulatory Visit: Payer: No Typology Code available for payment source

## 2017-06-17 ENCOUNTER — Telehealth: Payer: Self-pay | Admitting: Obstetrics and Gynecology

## 2017-06-17 NOTE — Telephone Encounter (Signed)
Call placed to patient to discuss benefits for ultrasound appointment today. Spoke with patient and obtained insurance information to pre-certify appointment.  Spoke with insurance company for pre-certification, See account notes for details.  Second call placed to patient around 10:00 AM to review pre-certification. Patient advised she is receiving a call and will call back.

## 2017-06-17 NOTE — Telephone Encounter (Signed)
Patient returning Suzy's call. °

## 2017-06-17 NOTE — Telephone Encounter (Signed)
Patient returned call, explaining she has been on the phone with her insurance company. Patient advised by her insurance company her of limited coverage on plan. Patient advised as a result of that call with her insurance company, she has "upgraded" her plan. The upgraded benefits will go into effect on 06/23/17.  Patient has requested to cancel the ultrasound appointment for today, and reschedule after the upgraded benefits go into effect. Patient is aware of the cancellation policy for today's appointment and is agreeable. Patient has rescheduled appointment for 07/01/17 with Dr Talbert Nan. Patient is aware of appointment date, arrival time and cancellation policy. I have requested patient to provided to me the new insurance identification number to pre-certify 7/84/78 appointment with new coverage. Patient is agreeable      cc: Lamont Snowball, RN  cc: Thayer Ohm

## 2017-06-17 NOTE — Telephone Encounter (Signed)
Routing to provider for final review. Will close encounter.     

## 2017-06-26 ENCOUNTER — Other Ambulatory Visit: Payer: Self-pay

## 2017-06-26 ENCOUNTER — Encounter (HOSPITAL_COMMUNITY): Payer: Self-pay

## 2017-06-26 ENCOUNTER — Ambulatory Visit (HOSPITAL_COMMUNITY)
Admission: EM | Admit: 2017-06-26 | Discharge: 2017-06-26 | Disposition: A | Discharge status: Home / Self Care | Payer: No Typology Code available for payment source | Attending: Family Medicine | Admitting: Family Medicine

## 2017-06-26 DIAGNOSIS — M545 Low back pain: Secondary | ICD-10-CM

## 2017-06-26 DIAGNOSIS — X500XXA Overexertion from strenuous movement or load, initial encounter: Secondary | ICD-10-CM

## 2017-06-26 DIAGNOSIS — S39012A Strain of muscle, fascia and tendon of lower back, initial encounter: Secondary | ICD-10-CM | POA: Diagnosis not present

## 2017-06-26 MED ORDER — KETOROLAC TROMETHAMINE 60 MG/2ML IM SOLN
INTRAMUSCULAR | Status: AC
  Filled 2017-06-26: qty 2

## 2017-06-26 MED ORDER — CYCLOBENZAPRINE HCL 5 MG PO TABS: 5.0000 mg | ORAL_TABLET | Freq: Every day | ORAL | 0 refills | Status: DC

## 2017-06-26 MED ORDER — KETOROLAC TROMETHAMINE 60 MG/2ML IM SOLN
60.0000 mg | Freq: Once | INTRAMUSCULAR | Status: AC
  Administered 2017-06-26: 60 mg via INTRAMUSCULAR

## 2017-06-26 MED ORDER — MELOXICAM 10 MG PO CAPS: 10.0000 mg | ORAL_CAPSULE | Freq: Every day | ORAL | 0 refills | Status: DC

## 2017-06-26 NOTE — ED Triage Notes (Signed)
Patent presents to [facility] for lower back pain since yesterday, pt noticed pain after lifting heavy laundry, pt states she took pain meds this morning but medication has worn off and now sharp pains are hitting her back

## 2017-06-26 NOTE — Discharge Instructions (Signed)
Meloxicam daily, may take first dose tomorrow, take with food. Flexeril at night as needed, may cause drowsiness so do not take if driving or drinking alcohol. Sleep with pillows under your knees Light and regular activity. See provided exercises. Work on improving core strength once symptoms improve in order to prevent future injury. If symptoms worsen or do not improve in the next 2-3 weeks to return to be seen or to follow up with your PCP.

## 2017-06-26 NOTE — ED Provider Notes (Signed)
New Site    CSN: [id number] Arrival date & time: 06/26/17  1719     History   Chief Complaint Chief Complaint  Patient presents with  . Back Pain    HPI Belinda Petersen is a 52 y.o. female.   [name] presents with complaints of low back pain after lifting a heavy load of laundry at her work place yesterday. Felt the pain shortly after and by yesterday afternoon it was quite severe. Pain last night with any movement. Took ibuprofen today which helped until it work off and now with increased pain. Pain to midline and bilateral low back with minimal radiation. Pain with getting into and out of car as well as with transitioning from sitting to standing or sitting to lying. Denies any previous back injury. No urinary symptoms or fevers. Without numbness or tingling to legs or feet. Without saddle paresthesia or urinary or stool incontinence. Hx of htn, anemia.     ROS per HPI.      Past Medical History:  Diagnosis Date  . Fibroids   . Headache(784.0)   . HTN (hypertension)    no meds  . Iron deficiency anemia secondary to blood loss (chronic)   . Menorrhagia   . SVD (spontaneous vaginal delivery)    x 1    Patient Active Problem List   Diagnosis Date Noted  . DOE (dyspnea on exertion) 05/04/2014  . Hypertensive urgency 10/14/2013  . Chest pain 10/13/2013  . Fibroids, submucosal 06/08/2012  . Menorrhagia 06/08/2012  . Fibroid uterus 04/29/2012  . 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 Essential hypertension, benign 04/29/2012    Past Surgical History:  Procedure Laterality Date  . DILITATION & CURRETTAGE/HYSTROSCOPY WITH VERSAPOINT RESECTION N/A 06/08/2012   Procedure: DILATATION & CURETTAGE/HYSTEROSCOPY WITH VERSAPOINT RESECTION;  Surgeon: Lyman Speller, MD;  Location: [facility];  Service: Gynecology;  Laterality: N/A;  . WISDOM TOOTH EXTRACTION      OB History    Gravida  1   Para  1   Term  0   Preterm    1   AB  0   Living  1     SAB  0   TAB  0   Ectopic  0   Multiple  0   Live Births  1            Home Medications    Prior to Admission medications   Medication Sig Start Date End Date Taking? Authorizing Provider  chlorthalidone (HYGROTON) 25 MG tablet Take 25 mg by mouth daily. 04/03/17  Yes [provider]  cholecalciferol (VITAMIN D) 1000 units tablet Take 1,000 Units by mouth daily.   Yes [provider]  Ferrous Sulfate (IRON) 325 (65 Fe) MG TABS Take 325 mg by mouth daily.   Yes [provider]  Multiple Vitamin (MULTIVITAMIN) tablet Take 1 tablet by mouth daily.   Yes [provider]  albuterol (PROVENTIL HFA;VENTOLIN HFA) 108 (90 BASE) MCG/ACT inhaler Inhale 2 puffs into the lungs every 4 (four) hours as needed for wheezing or shortness of breath. 01/07/15   Konrad Felix, PA  Biotin 1 MG CAPS Take 1 mg by mouth daily.    [provider]  cetirizine (ZYRTEC) 10 MG tablet Take 10 mg by mouth daily as needed for allergies.    [provider]  chlorthalidone (HYGROTON) 25 MG tablet Take 1 tablet (25 mg total) by mouth daily. 11/04/16 02/02/17  Ena Dawley  H, MD  cyclobenzaprine (FLEXERIL) 5 MG tablet Take 1 tablet (5 mg total) by mouth at bedtime. 06/26/17   Zigmund Gottron, NP  fluticasone (FLONASE) 50 MCG/ACT nasal spray Place 1 spray into both nostrils daily as needed for allergies or rhinitis.    [provider]  Meloxicam 10 MG CAPS Take 10 mg by mouth daily. 06/26/17   Zigmund Gottron, NP  olmesartan (BENICAR) 40 MG tablet Take 1 tablet (40 mg total) by mouth daily. 12/06/16   Dorothy Spark, MD  Probiotic Product (PROBIOTIC-10 PO) Take 1 tablet by mouth daily.    [provider]    Family History Family History  Problem Relation Age of Onset  . Diabetes Father   . Hypertension Father   . Lung cancer Father   . Hypertension Mother   . Congestive Heart Failure Mother   .  Hypertension Brother   . Hypertension Brother   . Hypertension Brother   . Hypertension Sister   . Heart disease Brother   . Heart attack Brother   . Breast cancer Paternal Grandmother   . Heart disease Maternal Grandmother        pacemaker  . Hypertension Maternal Grandmother   . Lung cancer Paternal Grandfather     Social History Social History   Tobacco Use  . Smoking status: Never Smoker  . Smokeless tobacco: Never Used  Substance Use Topics  . Alcohol use: Yes    Alcohol/week: 0.0 oz    Comment: 1 glass twice a month  . Drug use: No     Allergies   Hctz [hydrochlorothiazide]; Amlodipine besylate; and Irbesartan   Review of Systems Review of Systems   Physical Exam Triage Vital Signs ED Triage Vitals  Enc Vitals Group     BP 06/26/17 1811 (!) 134/98     Pulse Rate 06/26/17 1811 88     Resp 06/26/17 1811 17     Temp 06/26/17 1811 98.2 F (36.8 C)     Temp Source 06/26/17 1811 Oral     SpO2 06/26/17 1811 98 %     Weight --      Height --      Head Circumference --      Peak Flow --      Pain Score 06/26/17 1812 8     Pain Loc --      Pain Edu? --      Excl. in [place]? --    No data found.  Updated Vital Signs BP (!) 134/98 (BP Location: Left Arm)   Pulse 88   Temp 98.2 F (36.8 C) (Oral)   Resp 17   LMP 06/19/2017 (Exact Date)   SpO2 98%    Physical Exam  Constitutional: She is oriented to person, place, and time. She appears well-developed and well-nourished. No distress.  Cardiovascular: Normal rate, regular rhythm and normal heart sounds.  Pulmonary/Chest: Effort normal and breath sounds normal.  Musculoskeletal:       Lumbar back: She exhibits decreased range of motion, tenderness and pain. She exhibits no bony tenderness, no swelling, no edema, no deformity, no laceration, no spasm and normal pulse.       Back:  Generalized tenderness to low back without spinous process specific tenderness or step off palpable; ambulatory; pain with  transition from sit to stand; without pain with heel or toe touch weight bearing; sensation intact and strength equal to bilateral lower extremities; pain with bilateral hip flexion and straight leg raises; no  radiation of pain  Neurological: She is alert and oriented to person, place, and time.  Skin: Skin is warm and dry.     UC Treatments / Results  Labs (all labs ordered are listed, but only abnormal results are displayed) Labs Reviewed - No data to display  EKG None  Radiology No results found.  Procedures Procedures (including critical care time)  Medications Ordered in UC Medications  ketorolac (TORADOL) injection 60 mg (has no administration in time range)    Initial Impression / Assessment and Plan / UC Course  I have reviewed the triage vital signs and the nursing notes.  Pertinent labs & imaging results that were available during my care of the patient were reviewed by me and considered in my medical decision making (see chart for details).     History and physical consistent with lumbar strain. toradol provided in clinic today. To start daily mobic, use of flexeril at night as needed. Activity as tolerated and exercises provided. Encouraged follow up with PCP. Patient verbalized understanding and agreeable to plan.  Ambulatory out of clinic without difficulty.    Final Clinical Impressions(s) / UC Diagnoses   Final diagnoses:  Strain of lumbar region, initial encounter     Discharge Instructions     Meloxicam daily, may take first dose tomorrow, take with food. Flexeril at night as needed, may cause drowsiness so do not take if driving or drinking alcohol. Sleep with pillows under your knees Light and regular activity. See provided exercises. Work on improving core strength once symptoms improve in order to prevent future injury. If symptoms worsen or do not improve in the next 2-3 weeks to return to be seen or to follow up with your PCP.     ED  Prescriptions    Medication Sig Dispense Auth. Provider   Meloxicam 10 MG CAPS Take 10 mg by mouth daily. 30 capsule Augusto Gamble B, NP   cyclobenzaprine (FLEXERIL) 5 MG tablet Take 1 tablet (5 mg total) by mouth at bedtime. 15 tablet Zigmund Gottron, NP     Controlled Substance Prescriptions [place] Controlled Substance Registry consulted? Not Applicable   Zigmund Gottron, NP 06/26/17 1836

## 2017-06-27 ENCOUNTER — Telehealth (HOSPITAL_COMMUNITY): Payer: Self-pay

## 2017-06-27 ENCOUNTER — Telehealth: Payer: Self-pay | Admitting: Obstetrics and Gynecology

## 2017-06-27 MED ORDER — MELOXICAM 15 MG PO TABS: 15.0000 mg | ORAL_TABLET | Freq: Every day | ORAL | 0 refills | Status: DC

## 2017-06-27 NOTE — Telephone Encounter (Signed)
Needs different dose meloxicam

## 2017-06-27 NOTE — Telephone Encounter (Signed)
Patient canceled her PUS/Ov 0/27/74 due to a conflict with her insurance. Patient would like to discuss OOP before rescheduling. Routing to [place] to call this patient on Monday 06/30/17.

## 2017-06-30 NOTE — Telephone Encounter (Signed)
Returned call to patient to reschedule ultrasound appointment. Left voicemail message requesting a return call   cc: Lamont Snowball, RN

## 2017-07-01 ENCOUNTER — Other Ambulatory Visit: Payer: No Typology Code available for payment source

## 2017-07-01 ENCOUNTER — Other Ambulatory Visit: Payer: No Typology Code available for payment source | Admitting: Obstetrics and Gynecology

## 2017-07-01 NOTE — Telephone Encounter (Signed)
Routing to provider for final review. Patient agreeable to disposition. Will close encounter.     

## 2017-07-01 NOTE — Telephone Encounter (Signed)
Spoke with patient. Patient states her new coverage is in effect. Patient is aware and understands her benefits. Patient has rescheduled ultrasound appointment on 07/15/17 with Dr Talbert Nan. Patient is aware of the appointment date, arrival time and cancellation policy. Patient had no further questions,

## 2017-07-14 ENCOUNTER — Telehealth: Payer: Self-pay | Admitting: Cardiology

## 2017-07-14 NOTE — Telephone Encounter (Signed)
New Message    Patient is calling because she is needing a letter to advise that she had some medical issues with her heart in 2018 in order to be reimbursed for a flight. Please call to discuss.

## 2017-07-14 NOTE — Telephone Encounter (Signed)
Spoke with the patient and she is requesting a "letter" stating she was under our medical care and advised not safe to travel while she was being seen Sept to Oct 2018. She had a trip booked to [facility] Dec 2018 but when she was having BP problems and syncope she cancelled the flight 10/2016. She is trying to get a credit for that flight but needs a medical excuse for the travel insurance company with [employer]. Advised her that I would forward message to

## 2017-07-15 ENCOUNTER — Other Ambulatory Visit: Payer: Self-pay

## 2017-07-15 ENCOUNTER — Ambulatory Visit (INDEPENDENT_AMBULATORY_CARE_PROVIDER_SITE_OTHER): Payer: No Typology Code available for payment source | Admitting: Obstetrics and Gynecology

## 2017-07-15 ENCOUNTER — Encounter: Payer: Self-pay | Admitting: Obstetrics and Gynecology

## 2017-07-15 ENCOUNTER — Telehealth: Payer: Self-pay | Admitting: *Deleted

## 2017-07-15 ENCOUNTER — Ambulatory Visit (INDEPENDENT_AMBULATORY_CARE_PROVIDER_SITE_OTHER): Payer: No Typology Code available for payment source

## 2017-07-15 VITALS — BP 130/86 | HR 72 | Resp 16 | Wt 188.0 lb

## 2017-07-15 DIAGNOSIS — N920 Excessive and frequent menstruation with regular cycle: Secondary | ICD-10-CM | POA: Diagnosis not present

## 2017-07-15 DIAGNOSIS — Z862 Personal history of diseases of the blood and blood-forming organs and certain disorders involving the immune mechanism: Secondary | ICD-10-CM

## 2017-07-15 DIAGNOSIS — D252 Subserosal leiomyoma of uterus: Secondary | ICD-10-CM

## 2017-07-15 DIAGNOSIS — N946 Dysmenorrhea, unspecified: Secondary | ICD-10-CM | POA: Diagnosis not present

## 2017-07-15 DIAGNOSIS — D250 Submucous leiomyoma of uterus: Secondary | ICD-10-CM | POA: Diagnosis not present

## 2017-07-15 DIAGNOSIS — D251 Intramural leiomyoma of uterus: Secondary | ICD-10-CM | POA: Diagnosis not present

## 2017-07-15 MED ORDER — NORETHINDRONE 0.35 MG PO TABS: 1.0000 | ORAL_TABLET | Freq: Every day | ORAL | 0 refills | Status: DC

## 2017-07-15 NOTE — Telephone Encounter (Signed)
-----   Message from Consuelo Pandy, [place] sent at 07/14/2017  6:41 PM EDT ----- I remember this patient and do not recall telling her that she should not travel. I would have remembered if I advised for her to cancel a trip. I never tell patient's to cancel trips unless recommended by their primary cardiologist and those are typically high risk patients such as recent STEMIs, severe HF or recent cardiac surgery. If this was mentioned by our practice, then it seems like she would have asked for a letter back in September. Maybe it was her PCP or other provider that advised against travel.

## 2017-07-15 NOTE — Progress Notes (Signed)
GYNECOLOGY  VISIT   HPI: 52 y.o.   Married  [place] American  female   [phone] with Patient's last menstrual period was 06/20/2017.   here for follow up of heavy menstrual bleeding   Cycles are monthly x 4-5 days. Heavy x 2-3 days. Bad cramps for one day, helped with midol. On her heaviest day she is saturating a super tampon in up 1.5 hours. No BTB.  Was taking iron pills, but hasn't taken her pills in several weeks.  She finds her bleeding and cramps tolerable. She isn't tired, just cold all the time.    GYNECOLOGIC HISTORY: Patient's last menstrual period was 06/20/2017. Contraception:none  Menopausal hormone therapy: none         OB History    Gravida  1   Para  1   Term  0   Preterm  1   AB  0   Living  1     SAB  0   TAB  0   Ectopic  0   Multiple  0   Live Births  1              Patient Active Problem List   Diagnosis Date Noted  . DOE (dyspnea on exertion) 05/04/2014  . Hypertensive urgency 10/14/2013  . Chest pain 10/13/2013  . Fibroids, submucosal 06/08/2012  . Menorrhagia 06/08/2012  . Fibroid uterus 04/29/2012  . 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 Essential hypertension, benign 04/29/2012    Past Medical History:  Diagnosis Date  . Fibroids   . Headache(784.0)   . HTN (hypertension)    no meds  . Iron deficiency anemia secondary to blood loss (chronic)   . Menorrhagia   . SVD (spontaneous vaginal delivery)    x 1    Past Surgical History:  Procedure Laterality Date  . DILITATION & CURRETTAGE/HYSTROSCOPY WITH VERSAPOINT RESECTION N/A 06/08/2012   Procedure: DILATATION & CURETTAGE/HYSTEROSCOPY WITH VERSAPOINT RESECTION;  Surgeon: Lyman Speller, MD;  Location: [facility];  Service: Gynecology;  Laterality: N/A;  . WISDOM TOOTH EXTRACTION      Current Outpatient Medications  Medication Sig Dispense Refill  . albuterol (PROVENTIL HFA;VENTOLIN HFA) 108 (90 BASE) MCG/ACT inhaler Inhale 2 puffs into  the lungs every 4 (four) hours as needed for wheezing or shortness of breath. 1 Inhaler 0  . Biotin 1 MG CAPS Take 1 mg by mouth daily.    . cetirizine (ZYRTEC) 10 MG tablet Take 10 mg by mouth daily as needed for allergies.    . chlorthalidone (HYGROTON) 25 MG tablet Take 25 mg by mouth daily.  11  . cholecalciferol (VITAMIN D) 1000 units tablet Take 1,000 Units by mouth daily.    . cyclobenzaprine (FLEXERIL) 5 MG tablet Take 1 tablet (5 mg total) by mouth at bedtime. 15 tablet 0  . Ferrous Sulfate (IRON) 325 (65 Fe) MG TABS Take 325 mg by mouth daily.    . fluticasone (FLONASE) 50 MCG/ACT nasal spray Place 1 spray into both nostrils daily as needed for allergies or rhinitis.    . Multiple Vitamin (MULTIVITAMIN) tablet Take 1 tablet by mouth daily.    . Probiotic Product (PROBIOTIC-10 PO) Take 1 tablet by mouth daily.     No current facility-administered medications for this visit.      ALLERGIES: Hctz [hydrochlorothiazide]; Amlodipine besylate; and Irbesartan  Family History  Problem Relation Age of Onset  . Diabetes Father   . Hypertension Father   .  Lung cancer Father   . Hypertension Mother   . Congestive Heart Failure Mother   . Hypertension Brother   . Hypertension Brother   . Hypertension Brother   . Hypertension Sister   . Heart disease Brother   . Heart attack Brother   . Breast cancer Paternal Grandmother   . Heart disease Maternal Grandmother        pacemaker  . Hypertension Maternal Grandmother   . Lung cancer Paternal Grandfather     Social History   Socioeconomic History  . Marital status: Married    Spouse name: Not on file  . Number of children: 1  . Years of education: Not on file  . Highest education level: Not on file  Occupational History    Employer: [employer]  Social Needs  . Financial resource strain: Not on file  . Food insecurity:    Worry: Not on file    Inability: Not on file  . Transportation needs:    Medical: Not on  file    Non-medical: Not on file  Tobacco Use  . Smoking status: Never Smoker  . Smokeless tobacco: Never Used  Substance and Sexual Activity  . Alcohol use: Yes    Alcohol/week: 0.0 oz    Comment: 1 glass twice a month  . Drug use: No  . Sexual activity: Yes    Partners: Male    Birth control/protection: None  Lifestyle  . Physical activity:    Days per week: Not on file    Minutes per session: Not on file  . Stress: Not on file  Relationships  . Social connections:    Talks on phone: Not on file    Gets together: Not on file    Attends religious service: Not on file    Active member of club or organization: Not on file    Attends meetings of clubs or organizations: Not on file    Relationship status: Not on file  . Intimate partner violence:    Fear of current or ex partner: Not on file    Emotionally abused: Not on file    Physically abused: Not on file    Forced sexual activity: Not on file  Other Topics Concern  . Not on file  Social History Narrative  . Not on file    Review of Systems  Constitutional: Negative.   HENT: Negative.   Respiratory: Negative.   Cardiovascular: Negative.   Gastrointestinal: Negative.   Genitourinary:       Heavy menstrual cycles  Musculoskeletal: Negative.   Skin: Negative.   Neurological: Negative.   Endo/Heme/Allergies: Negative.   Psychiatric/Behavioral: Negative.     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 This is a new finding H/O anemia    PLAN CBC, Ferritin Discussed option of doing nothing (if not anemic), the mini-pill,  depo-provera, hysteroscopic myomectomy, IUD (after myomectomy), uterine artery embolization and TLH. She would like to try the minipill  Start the mini-pill, f/u in 3 months Will make recommendations for iron after her lab work  is back   An After Visit Summary was printed and given to the patient.

## 2017-07-15 NOTE — Telephone Encounter (Signed)
Spoke with the pt and endorsed to her that unfortunately, we cannot provide a letter supporting that we told her to cancel a flight due to BP issues.  Endorsed to the pt that per Ellen Henri PA-C, the only times we tell pts to cancel their trips is if they are high risk such as STEMIs, severe CHF, or recent cardiac surgery.  Pt verbalized understanding and gracious for the assistance provided.

## 2017-07-15 NOTE — Patient Instructions (Signed)
You can take magnesium, up to 500 mg a day for constipation from the iron.

## 2017-07-16 LAB — CBC
Hematocrit: 31.7 % — ABNORMAL LOW (ref 34.0–46.6)
Hemoglobin: 10.5 g/dL — ABNORMAL LOW (ref 11.1–15.9)
MCH: 27.1 pg (ref 26.6–33.0)
MCHC: 33.1 g/dL (ref 31.5–35.7)
MCV: 82 fL (ref 79–97)
PLATELETS: 289 10*3/uL (ref 150–450)
RBC: 3.87 x10E6/uL (ref 3.77–5.28)
RDW: 15.2 % (ref 12.3–15.4)
WBC: 3.4 10*3/uL (ref 3.4–10.8)

## 2017-07-16 LAB — FERRITIN: Ferritin: 55 ng/mL (ref 15–150)

## 2017-07-25 ENCOUNTER — Other Ambulatory Visit: Payer: Self-pay | Admitting: Family Medicine

## 2017-09-04 ENCOUNTER — Encounter (HOSPITAL_COMMUNITY): Payer: Self-pay | Admitting: Emergency Medicine

## 2017-09-04 ENCOUNTER — Ambulatory Visit (HOSPITAL_COMMUNITY)
Admission: EM | Admit: 2017-09-04 | Discharge: 2017-09-04 | Disposition: A | Discharge status: Home / Self Care | Payer: No Typology Code available for payment source | Attending: Emergency Medicine | Admitting: Emergency Medicine

## 2017-09-04 DIAGNOSIS — H6501 Acute serous otitis media, right ear: Secondary | ICD-10-CM | POA: Diagnosis not present

## 2017-09-04 DIAGNOSIS — J0140 Acute pansinusitis, unspecified: Secondary | ICD-10-CM | POA: Diagnosis not present

## 2017-09-04 MED ORDER — BENZONATATE 200 MG PO CAPS: 200.0000 mg | ORAL_CAPSULE | Freq: Three times a day (TID) | ORAL | 0 refills | Status: DC | PRN

## 2017-09-04 MED ORDER — HYDROCOD POLST-CPM POLST ER 10-8 MG/5ML PO SUER: 5.0000 mL | Freq: Two times a day (BID) | ORAL | 0 refills | Status: DC | PRN

## 2017-09-04 MED ORDER — IBUPROFEN 600 MG PO TABS: 600.0000 mg | ORAL_TABLET | Freq: Four times a day (QID) | ORAL | 0 refills | Status: DC | PRN

## 2017-09-04 MED ORDER — AMOXICILLIN-POT CLAVULANATE 875-125 MG PO TABS: 1.0000 | ORAL_TABLET | Freq: Two times a day (BID) | ORAL | 0 refills | Status: DC

## 2017-09-04 MED ORDER — ALBUTEROL SULFATE HFA 108 (90 BASE) MCG/ACT IN AERS: 2.0000 | INHALATION_SPRAY | RESPIRATORY_TRACT | 0 refills | Status: AC | PRN

## 2017-09-04 MED ORDER — FLUTICASONE PROPIONATE 50 MCG/ACT NA SUSP: 2.0000 | Freq: Every day | NASAL | 0 refills | Status: AC

## 2017-09-04 NOTE — ED Triage Notes (Signed)
PT reports sinus pain and pressure, cough, headache, and right ear pain for over 1 week.

## 2017-09-04 NOTE — ED Provider Notes (Signed)
HPI  SUBJECTIVE:  Belinda Petersen is a 52 y.o. female who presents with sinus pain and pressure, clear rhinorrhea, postnasal drip for over a week.  She reports a headache located in her sinuses, dry cough, body aches, upper dental pain.  No nasal congestion, wheezing, chest pain, shortness of breath.  No allergy symptoms.  No fevers.  She has tried Robitussin, Coricidin, Zyrtec, Benadryl, Tylenol, ibuprofen without improvement in her symptoms.  Her symptoms are worse with coughing and lying down.  She states that she is unable to sleep at night secondary to the coughing.  She also reports right achy ear pain for the past 2 days.  Denies change in hearing, otorrhea.  She states that she feels as if she has fluid behind her ear.  She reports feeling right-sided cervical lymphadenopathy today.  No recent swimming, foreign body insertion.  She tried witch hazel and a Q-tip without improvement in her symptoms.  Pain is worse with lying down.  She has a past medical history of hypertension, seasonal allergies, anemia, chronic bronchitis.  No history of diabetes, pulmonary disease.  She is not a smoker.  PMD: None.  States that he retired.    Past Medical History:  Diagnosis Date  . Fibroids   . Headache(784.0)   . HTN (hypertension)    no meds  . Iron deficiency anemia secondary to blood loss (chronic)   . Menorrhagia   . SVD (spontaneous vaginal delivery)    x 1    Past Surgical History:  Procedure Laterality Date  . DILITATION & CURRETTAGE/HYSTROSCOPY WITH VERSAPOINT RESECTION N/A 06/08/2012   Procedure: DILATATION & CURETTAGE/HYSTEROSCOPY WITH VERSAPOINT RESECTION;  Surgeon: Lyman Speller, MD;  Location: [facility];  Service: Gynecology;  Laterality: N/A;  . WISDOM TOOTH EXTRACTION      Family History  Problem Relation Age of Onset  . Diabetes Father   . Hypertension Father   . Lung cancer Father   . Hypertension Mother   . Congestive Heart Failure Mother   . Hypertension Brother    . Hypertension Brother   . Hypertension Brother   . Hypertension Sister   . Heart disease Brother   . Heart attack Brother   . Breast cancer Paternal Grandmother   . Heart disease Maternal Grandmother        pacemaker  . Hypertension Maternal Grandmother   . Lung cancer Paternal Grandfather     Social History   Tobacco Use  . Smoking status: Never Smoker  . Smokeless tobacco: Never Used  Substance Use Topics  . Alcohol use: Yes    Alcohol/week: 0.0 oz    Comment: 1 glass twice a month  . Drug use: No    No current facility-administered medications for this encounter.   Current Outpatient Medications:  .  Biotin 1 MG CAPS, Take 1 mg by mouth daily., Disp: , Rfl:  .  chlorthalidone (HYGROTON) 25 MG tablet, Take 25 mg by mouth daily., Disp: , Rfl: 11 .  cholecalciferol (VITAMIN D) 1000 units tablet, Take 1,000 Units by mouth daily., Disp: , Rfl:  .  Ferrous Sulfate (IRON) 325 (65 Fe) MG TABS, Take 325 mg by mouth daily., Disp: , Rfl:  .  magnesium 30 MG tablet, Take 30 mg by mouth 2 (two) times daily., Disp: , Rfl:  .  Multiple Vitamin (MULTIVITAMIN) tablet, Take 1 tablet by mouth daily., Disp: , Rfl:  .  norethindrone (MICRONOR,CAMILA,ERRIN) 0.35 MG tablet, Take 1 tablet (0.35 mg total) by mouth daily.,  Disp: 3 Package, Rfl: 0 .  Probiotic Product (PROBIOTIC-10 PO), Take 1 tablet by mouth daily., Disp: , Rfl:  .  albuterol (PROVENTIL HFA;VENTOLIN HFA) 108 (90 Base) MCG/ACT inhaler, Inhale 2 puffs into the lungs every 4 (four) hours as needed for wheezing or shortness of breath., Disp: 1 Inhaler, Rfl: 0 .  amoxicillin-clavulanate (AUGMENTIN) 875-125 MG tablet, Take 1 tablet by mouth 2 (two) times daily for 7 days., Disp: 14 tablet, Rfl: 0 .  benzonatate (TESSALON) 200 MG capsule, Take 1 capsule (200 mg total) by mouth 3 (three) times daily as needed for cough., Disp: 30 capsule, Rfl: 0 .  chlorpheniramine-HYDROcodone (TUSSIONEX PENNKINETIC ER) 10-8 MG/5ML SUER, Take 5 mLs by  mouth every 12 (twelve) hours as needed for cough., Disp: 120 mL, Rfl: 0 .  cyclobenzaprine (FLEXERIL) 5 MG tablet, Take 1 tablet (5 mg total) by mouth at bedtime., Disp: 15 tablet, Rfl: 0 .  fluticasone (FLONASE) 50 MCG/ACT nasal spray, Place 2 sprays into both nostrils daily., Disp: 16 g, Rfl: 0 .  ibuprofen (ADVIL,MOTRIN) 600 MG tablet, Take 1 tablet (600 mg total) by mouth every 6 (six) hours as needed., Disp: 30 tablet, Rfl: 0  Allergies  Allergen Reactions  . Hctz [Hydrochlorothiazide]     itching  . Amlodipine Besylate   . Irbesartan Hives    Pt reports causes "hives."     ROS  As noted in HPI.   Physical Exam  BP (!) 146/103   Pulse 92   Temp 98.7 F (37.1 C) (Oral)   Resp 16   Wt 190 lb (86.2 kg)   LMP 08/11/2017   SpO2 97%   BMI 33.66 kg/m   Constitutional: Well developed, well nourished, no acute distress Eyes:  EOMI, conjunctiva normal bilaterally HENT: Normocephalic, atraumatic,mucus membranes moist.  Positive clear rhinorrhea.  Positive erythematous, swollen turbinates more on the right than the left.  No frontal or maxillary sinus tenderness.  Right external ear normal.  No pain with traction on pinna or palpation of tragus.  No tenderness over the mastoid.  Normal TM with effusion. Left TM normal.  Normal oropharynx.  No obvious postnasal drip, cobblestoning.   Neck: Positive right-sided cervical lymphadenopathy. Respiratory: Normal inspiratory effort, lungs clear bilaterally.  No chest wall tenderness Cardiovascular: Normal rate, regular rhythm, no murmurs, rubs, gallops GI: nondistended skin: No rash, skin intact Musculoskeletal: no deformities Neurologic: Alert & oriented x 3, no focal neuro deficits Psychiatric: Speech and behavior appropriate   ED Course   Medications - No data to display  No orders of the defined types were placed in this encounter.   No results found for this or any previous visit (from the past 24 hour(s)). No results  found.  ED Clinical Impression  Non-recurrent acute serous otitis media of right ear  Acute non-recurrent pansinusitis   ED Assessment/Plan  [place] Narcotic database reviewed for this patient, and feel that the risk/benefit ratio today is favorable for proceeding with a prescription for controlled substance.  No opiate prescriptions since August 2018.  Patient has a serous otitis media and suspected sinusitis.  Given duration of symptoms, will send home with a wait-and-see prescription of Augmentin, Tessalon, Tussionex, will refill her albuterol inhaler and also give her spacer.  600 mg of ibuprofen to take with 1 g of Tylenol together 3 or 4 times a day and as needed for body aches, headaches, Flonase, saline nasal irrigation with a Milta Deiters med sinus rinse and distilled water.  Discontinue all antihistamines and  start regular Mucinex instead.  Follow-up with PMD of choice as needed.  Providing primary care referral list.  Discussed  MDM, treatment plan, and plan for follow-up with patient. patient agrees with plan.   Meds ordered this encounter  Medications  . albuterol (PROVENTIL HFA;VENTOLIN HFA) 108 (90 Base) MCG/ACT inhaler    Sig: Inhale 2 puffs into the lungs every 4 (four) hours as needed for wheezing or shortness of breath.    Dispense:  1 Inhaler    Refill:  0  . fluticasone (FLONASE) 50 MCG/ACT nasal spray    Sig: Place 2 sprays into both nostrils daily.    Dispense:  16 g    Refill:  0  . benzonatate (TESSALON) 200 MG capsule    Sig: Take 1 capsule (200 mg total) by mouth 3 (three) times daily as needed for cough.    Dispense:  30 capsule    Refill:  0  . ibuprofen (ADVIL,MOTRIN) 600 MG tablet    Sig: Take 1 tablet (600 mg total) by mouth every 6 (six) hours as needed.    Dispense:  30 tablet    Refill:  0  . chlorpheniramine-HYDROcodone (TUSSIONEX PENNKINETIC ER) 10-8 MG/5ML SUER    Sig: Take 5 mLs by mouth every 12 (twelve) hours as needed for cough.    Dispense:  120 mL     Refill:  0  . amoxicillin-clavulanate (AUGMENTIN) 875-125 MG tablet    Sig: Take 1 tablet by mouth 2 (two) times daily for 7 days.    Dispense:  14 tablet    Refill:  0    *This clinic note was created using [employer]. Therefore, there may be occasional mistakes despite careful proofreading.   ?   Melynda Ripple, MD 09/05/17 [phone]

## 2017-09-04 NOTE — ED Notes (Signed)
Bed: UC01 Expected date:  Expected time:  Means of arrival:  Comments: Hold for appt

## 2017-09-04 NOTE — Discharge Instructions (Addendum)
wait-and-see prescription of Augmentin, Tessalon, Tussionex, albuterol inhaler with your spacer 2 puffs every 4-6 hours as needed for coughing, wheezing, shortness of breath.  600 mg of ibuprofen to take with 1 g of Tylenol together 3 or 4 times a day and as needed for body aches, headaches.  Flonase, saline nasal irrigation with a Milta Deiters med sinus rinse and distilled water.  Discontinue all antihistamines like Benadryl and Zyrtec and start regular Mucinex instead.   Below is a list of primary care practices who are taking new patients for you to follow-up with. [facility] [place], [place] [address] [phone]  [facility] [phone] [place] [address]  [facility]: [place]  [phone]  [facility]: [place]   [phone]  [facility]: [address] [place]  [phone]  [facility] : [address] [phone]  [facility]: [address] [place] [address] [phone]  [facility]: [place] [phone]  Dr. Blanchie Serve [place]  [phone]  Dr. Benito Mccreedy, [facility]. [place], [place] [address]  [phone]  Go to www.goodrx.com to look up your medications. This will give you a list of where you can find your prescriptions at the most affordable prices. Or ask the pharmacist what the cash price is, or if they have any other discount programs available to help make your medication more affordable. This can be less expensive  than what you would pay with insurance.

## 2017-10-04 ENCOUNTER — Other Ambulatory Visit: Payer: Self-pay | Admitting: Obstetrics and Gynecology

## 2017-10-06 NOTE — Telephone Encounter (Signed)
Left message advising patient she needs an appointment to discuss medication and how it is controlling her bleeding.

## 2017-10-06 NOTE — Telephone Encounter (Signed)
The patient is due for a f/u visit to see if the micronor is controlling her bleeding and if her anemia is improving. Please call her and schedule her for a f/u visit. I will send in one more pack.

## 2017-10-06 NOTE — Telephone Encounter (Signed)
Medication refill request: Micronor Last AEX:  01/09/2017 Next AEX: 01/26/2018 Last MMG (if hormonal medication request): 12/12/2016 Refill authorized: #[id number], 0 refills, please advise.

## 2017-10-08 NOTE — Progress Notes (Deleted)
GYNECOLOGY  VISIT   HPI: 52 y.o.   Married  [place] American  female   [phone] with No LMP recorded.   here for abnormal uterine bleeding.   GYNECOLOGIC HISTORY: No LMP recorded. Contraception:none Menopausal hormone therapy: none        OB History    Gravida  1   Para  1   Term  0   Preterm  1   AB  0   Living  1     SAB  0   TAB  0   Ectopic  0   Multiple  0   Live Births  1              Patient Active Problem List   Diagnosis Date Noted  . DOE (dyspnea on exertion) 05/04/2014  . Hypertensive urgency 10/14/2013  . Chest pain 10/13/2013  . Fibroids, submucosal 06/08/2012  . Menorrhagia 06/08/2012  . Fibroid uterus 04/29/2012  . 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 Essential hypertension, benign 04/29/2012    Past Medical History:  Diagnosis Date  . Fibroids   . Headache(784.0)   . HTN (hypertension)    no meds  . Iron deficiency anemia secondary to blood loss (chronic)   . Menorrhagia   . SVD (spontaneous vaginal delivery)    x 1    Past Surgical History:  Procedure Laterality Date  . DILITATION & CURRETTAGE/HYSTROSCOPY WITH VERSAPOINT RESECTION N/A 06/08/2012   Procedure: DILATATION & CURETTAGE/HYSTEROSCOPY WITH VERSAPOINT RESECTION;  Surgeon: Lyman Speller, MD;  Location: [facility];  Service: Gynecology;  Laterality: N/A;  . WISDOM TOOTH EXTRACTION      Current Outpatient Medications  Medication Sig Dispense Refill  . albuterol (PROVENTIL HFA;VENTOLIN HFA) 108 (90 Base) MCG/ACT inhaler Inhale 2 puffs into the lungs every 4 (four) hours as needed for wheezing or shortness of breath. 1 Inhaler 0  . benzonatate (TESSALON) 200 MG capsule Take 1 capsule (200 mg total) by mouth 3 (three) times daily as needed for cough. 30 capsule 0  . Biotin 1 MG CAPS Take 1 mg by mouth daily.    . chlorpheniramine-HYDROcodone (TUSSIONEX PENNKINETIC ER) 10-8 MG/5ML SUER Take 5 mLs by mouth every 12 (twelve) hours as needed  for cough. 120 mL 0  . chlorthalidone (HYGROTON) 25 MG tablet Take 25 mg by mouth daily.  11  . cholecalciferol (VITAMIN D) 1000 units tablet Take 1,000 Units by mouth daily.    . cyclobenzaprine (FLEXERIL) 5 MG tablet Take 1 tablet (5 mg total) by mouth at bedtime. 15 tablet 0  . Ferrous Sulfate (IRON) 325 (65 Fe) MG TABS Take 325 mg by mouth daily.    . fluticasone (FLONASE) 50 MCG/ACT nasal spray Place 2 sprays into both nostrils daily. 16 g 0  . ibuprofen (ADVIL,MOTRIN) 600 MG tablet Take 1 tablet (600 mg total) by mouth every 6 (six) hours as needed. 30 tablet 0  . magnesium 30 MG tablet Take 30 mg by mouth 2 (two) times daily.    . Multiple Vitamin (MULTIVITAMIN) tablet Take 1 tablet by mouth daily.    . norethindrone (MICRONOR,CAMILA,ERRIN) 0.35 MG tablet TAKE 1 TABLET BY MOUTH EVERY DAY 28 tablet 0  . Probiotic Product (PROBIOTIC-10 PO) Take 1 tablet by mouth daily.     No current facility-administered medications for this visit.      ALLERGIES: Hctz [hydrochlorothiazide]; Amlodipine besylate; and Irbesartan  Family History  Problem Relation Age of Onset  . Diabetes Father   .  Hypertension Father   . Lung cancer Father   . Hypertension Mother   . Congestive Heart Failure Mother   . Hypertension Brother   . Hypertension Brother   . Hypertension Brother   . Hypertension Sister   . Heart disease Brother   . Heart attack Brother   . Breast cancer Paternal Grandmother   . Heart disease Maternal Grandmother        pacemaker  . Hypertension Maternal Grandmother   . Lung cancer Paternal Grandfather     Social History   Socioeconomic History  . Marital status: Married    Spouse name: Not on file  . Number of children: 1  . Years of education: Not on file  . Highest education level: Not on file  Occupational History    Employer: [employer]  Social Needs  . Financial resource strain: Not on file  . Food insecurity:    Worry: Not on file    Inability: Not  on file  . Transportation needs:    Medical: Not on file    Non-medical: Not on file  Tobacco Use  . Smoking status: Never Smoker  . Smokeless tobacco: Never Used  Substance and Sexual Activity  . Alcohol use: Yes    Alcohol/week: 0.0 standard drinks    Comment: 1 glass twice a month  . Drug use: No  . Sexual activity: Yes    Partners: Male    Birth control/protection: None  Lifestyle  . Physical activity:    Days per week: Not on file    Minutes per session: Not on file  . Stress: Not on file  Relationships  . Social connections:    Talks on phone: Not on file    Gets together: Not on file    Attends religious service: Not on file    Active member of club or organization: Not on file    Attends meetings of clubs or organizations: Not on file    Relationship status: Not on file  . Intimate partner violence:    Fear of current or ex partner: Not on file    Emotionally abused: Not on file    Physically abused: Not on file    Forced sexual activity: Not on file  Other Topics Concern  . Not on file  Social History Narrative  . Not on file    Review of Systems  Constitutional: Negative.   HENT: Negative.   Eyes: Negative.   Respiratory: Negative.   Cardiovascular: Negative.   Gastrointestinal: Negative.   Genitourinary:       Abnormal uterine bleeding  Musculoskeletal: Negative.   Skin: Negative.   Neurological: Negative.   Endo/Heme/Allergies: Negative.   Psychiatric/Behavioral: Negative.   All other systems reviewed and are negative.   PHYSICAL EXAMINATION:    There were no vitals taken for this visit.    General appearance: alert, cooperative and appears stated age Neck: no adenopathy, supple, symmetrical, trachea midline and thyroid {CHL AMB PHY EX THYROID NORM DEFAULT:[phone]::"normal to inspection and palpation"} Breasts: {Exam; breast:13139::"normal appearance, no masses or tenderness"} Abdomen: soft, non-tender; non distended, no masses,  no  organomegaly  Pelvic: External genitalia:  no lesions              Urethra:  normal appearing urethra with no masses, tenderness or lesions              Bartholins and Skenes: normal  Vagina: normal appearing vagina with normal color and discharge, no lesions              Cervix: {CHL AMB PHY EX CERVIX NORM [phone]::"no lesions"}              Bimanual Exam:  Uterus:  {CHL AMB PHY EX UTERUS NORM [phone]::"normal size, contour, position, consistency, mobility, non-tender"}              Adnexa: {CHL AMB PHY EX ADNEXA NO MASS DEFAULT:[phone]::"no mass, fullness, tenderness"}              Rectovaginal: {yes no:[id number]}.  Confirms.              Anus:  normal sphincter tone, no lesions  Chaperone was present for exam.  ASSESSMENT     PLAN    An After Visit Summary was printed and given to the patient.  *** minutes face to face time of which over 50% was spent in counseling.

## 2017-10-10 ENCOUNTER — Telehealth: Payer: Self-pay | Admitting: Obstetrics and Gynecology

## 2017-10-10 ENCOUNTER — Other Ambulatory Visit: Payer: Self-pay | Admitting: Cardiology

## 2017-10-10 NOTE — Telephone Encounter (Signed)
Patient canceled her upcoming appointment 10/14/17 for discuss bleeding and stopped ocp. Patient to call later to reschedule.

## 2017-10-14 ENCOUNTER — Ambulatory Visit: Payer: Self-pay | Admitting: Obstetrics and Gynecology

## 2017-12-08 ENCOUNTER — Other Ambulatory Visit: Payer: Self-pay

## 2017-12-08 DIAGNOSIS — Z1231 Encounter for screening mammogram for malignant neoplasm of breast: Secondary | ICD-10-CM

## 2018-01-12 ENCOUNTER — Other Ambulatory Visit: Payer: Self-pay | Admitting: Cardiology

## 2018-01-20 ENCOUNTER — Ambulatory Visit: Payer: No Typology Code available for payment source

## 2018-01-21 ENCOUNTER — Ambulatory Visit: Payer: No Typology Code available for payment source | Admitting: Family Medicine

## 2018-01-21 ENCOUNTER — Other Ambulatory Visit: Payer: Self-pay

## 2018-01-21 ENCOUNTER — Encounter: Payer: Self-pay | Admitting: Family Medicine

## 2018-01-21 VITALS — BP 128/84 | HR 90 | Temp 98.7°F | Resp 17 | Ht 63.0 in | Wt 190.8 lb

## 2018-01-21 DIAGNOSIS — Z23 Encounter for immunization: Secondary | ICD-10-CM

## 2018-01-21 DIAGNOSIS — Z1211 Encounter for screening for malignant neoplasm of colon: Secondary | ICD-10-CM | POA: Diagnosis not present

## 2018-01-21 DIAGNOSIS — E559 Vitamin D deficiency, unspecified: Secondary | ICD-10-CM

## 2018-01-21 DIAGNOSIS — I10 Essential (primary) hypertension: Secondary | ICD-10-CM

## 2018-01-21 DIAGNOSIS — D500 Iron deficiency anemia secondary to blood loss (chronic): Secondary | ICD-10-CM | POA: Diagnosis not present

## 2018-01-21 NOTE — Patient Instructions (Addendum)
If you have lab work done today you will be contacted with your lab results within the next 2 weeks.  If you have not heard from [place] then please contact us. The fastest way to get your results is to register for My Chart.   IF you received an x-ray today, you will receive an invoice from [facility]. Please contact [facility] at [phone] with questions or concerns regarding your invoice.   IF you received labwork today, you will receive an invoice from [facility]. Please contact [facility] at [phone] with questions or concerns regarding your invoice.   Our billing staff will not be able to assist you with questions regarding bills from these companies.  You will be contacted with the lab results as soon as they are available. The fastest way to get your results is to activate your My Chart account. Instructions are located on the last page of this paperwork. If you have not heard from [place] regarding the results in 2 weeks, please contact this office.     Anemia Anemia is a condition in which you do not have enough red blood cells or hemoglobin. Hemoglobin is a substance in red blood cells that carries oxygen. When you do not have enough red blood cells or hemoglobin (are anemic), your body cannot get enough oxygen and your organs may not work properly. As a result, you may feel very tired or have other problems. What are the causes? Common causes of anemia include:  Excessive bleeding. Anemia can be caused by excessive bleeding inside or outside the body, including bleeding from the intestine or from periods in women.  Poor nutrition.  Long-lasting (chronic) kidney, thyroid, and liver disease.  Bone marrow disorders.  Cancer and treatments for cancer.  HIV (human immunodeficiency virus) and AIDS (acquired immunodeficiency syndrome).  Treatments for HIV and AIDS.  Spleen problems.  Blood disorders.  Infections, medicines, and autoimmune disorders that  destroy red blood cells.  What are the signs or symptoms? Symptoms of this condition include:  Minor weakness.  Dizziness.  Headache.  Feeling heartbeats that are irregular or faster than normal (palpitations).  Shortness of breath, especially with exercise.  Paleness.  Cold sensitivity.  Indigestion.  Nausea.  Difficulty sleeping.  Difficulty concentrating.  Symptoms may occur suddenly or develop slowly. If your anemia is mild, you may not have symptoms. How is this diagnosed? This condition is diagnosed based on:  Blood tests.  Your medical history.  A physical exam.  Bone marrow biopsy.  Your health care provider may also check your stool (feces) for blood and may do additional testing to look for the cause of your bleeding. You may also have other tests, including:  Imaging tests, such as a CT scan or MRI.  Endoscopy.  Colonoscopy.  How is this treated? Treatment for this condition depends on the cause. If you continue to lose a lot of blood, you may need to be treated at a hospital. Treatment may include:  Taking supplements of iron, vitamin T46, or folic acid.  Taking a hormone medicine (erythropoietin) that can help to stimulate red blood cell growth.  Having a blood transfusion. This may be needed if you lose a lot of blood.  Making changes to your diet.  Having surgery to remove your spleen.  Follow these instructions at home:  Take over-the-counter and prescription medicines only as told by your health care provider.  Take supplements only as told by your health care provider.  Follow  any diet instructions that you were given.  Keep all follow-up visits as told by your health care provider. This is important. Contact a health care provider if:  You develop new bleeding anywhere in the body. Get help right away if:  You are very weak.  You are short of breath.  You have pain in your abdomen or chest.  You are dizzy or feel  faint.  You have trouble concentrating.  You have bloody or black, tarry stools.  You vomit repeatedly or you vomit up blood. Summary  Anemia is a condition in which you do not have enough red blood cells or enough of a substance in your red blood cells that carries oxygen (hemoglobin).  Symptoms may occur suddenly or develop slowly.  If your anemia is mild, you may not have symptoms.  This condition is diagnosed with blood tests as well as a medical history and physical exam. Other tests may be needed.  Treatment for this condition depends on the cause of the anemia. This information is not intended to replace advice given to you by your health care provider. Make sure you discuss any questions you have with your health care provider. Document Released: 03/07/2004 Document Revised: 03/01/2016 Document Reviewed: 03/01/2016 Elsevier Interactive Patient Education  [employer].

## 2018-01-21 NOTE — Progress Notes (Signed)
Established Patient Office Visit  Subjective:  Patient ID: Belinda Petersen, female    DOB: Dec 16, 1965  Age: 52 y.o. MRN: [id number]  CC:  Chief Complaint  Patient presents with  . Establish Care    pt would like to discuss fatigued alot, sinus issues, and sickness after eating  . Medication Refill    chlorthalidone    HPI Jessabelle Markiewicz presents for   Hypertension: Patient here for follow-up of elevated blood pressure. She is exercising and is adherent to low salt diet.  Blood pressure is well controlled at home. Cardiac symptoms none. Patient denies chest pain, chest pressure/discomfort, claudication, dyspnea, exertional chest pressure/discomfort, irregular heart beat, lower extremity edema and near-syncope.  Cardiovascular risk factors: hypertension. Use of agents associated with hypertension: none. History of target organ damage: none. BP Readings from Last 3 Encounters:  01/21/18 128/84  09/04/17 (!) 146/103  07/15/17 130/86   She denies history of known hypokalemia She takes a multivitamin and her iron daily  Anemia  She ha a history of fibroid which was causing her anemia She is no longer taking micronor She sees Gynecology for continued work up of her anemia She reports intermittent dizziness, fatigue and cold intolerance, no palpitations.    Past Medical History:  Diagnosis Date  . Fibroids   . Headache(784.0)   . HTN (hypertension)    no meds  . Iron deficiency anemia secondary to blood loss (chronic)   . Menorrhagia   . SVD (spontaneous vaginal delivery)    x 1    Past Surgical History:  Procedure Laterality Date  . DILITATION & CURRETTAGE/HYSTROSCOPY WITH VERSAPOINT RESECTION N/A 06/08/2012   Procedure: DILATATION & CURETTAGE/HYSTEROSCOPY WITH VERSAPOINT RESECTION;  Surgeon: Lyman Speller, MD;  Location: [facility];  Service: Gynecology;  Laterality: N/A;  . WISDOM TOOTH EXTRACTION      Family History  Problem Relation Age of Onset  . Diabetes  Father   . Hypertension Father   . Lung cancer Father   . Hypertension Mother   . Congestive Heart Failure Mother   . Hypertension Brother   . Hypertension Brother   . Hypertension Brother   . Hypertension Sister   . Heart disease Brother   . Heart attack Brother   . Breast cancer Paternal Grandmother   . Heart disease Maternal Grandmother        pacemaker  . Hypertension Maternal Grandmother   . Lung cancer Paternal Grandfather     Social History   Socioeconomic History  . Marital status: Married    Spouse name: Not on file  . Number of children: 1  . Years of education: Not on file  . Highest education level: Not on file  Occupational History    Employer: [employer]  Social Needs  . Financial resource strain: Not on file  . Food insecurity:    Worry: Not on file    Inability: Not on file  . Transportation needs:    Medical: Not on file    Non-medical: Not on file  Tobacco Use  . Smoking status: Never Smoker  . Smokeless tobacco: Never Used  Substance and Sexual Activity  . Alcohol use: Yes    Alcohol/week: 0.0 standard drinks    Comment: 1 glass twice a month  . Drug use: No  . Sexual activity: Yes    Partners: Male    Birth control/protection: None  Lifestyle  . Physical activity:    Days per week: Not on file  Minutes per session: Not on file  . Stress: Not on file  Relationships  . Social connections:    Talks on phone: Not on file    Gets together: Not on file    Attends religious service: Not on file    Active member of club or organization: Not on file    Attends meetings of clubs or organizations: Not on file    Relationship status: Not on file  . Intimate partner violence:    Fear of current or ex partner: Not on file    Emotionally abused: Not on file    Physically abused: Not on file    Forced sexual activity: Not on file  Other Topics Concern  . Not on file  Social History Narrative  . Not on file    Outpatient  Medications Prior to Visit  Medication Sig Dispense Refill  . albuterol (PROVENTIL HFA;VENTOLIN HFA) 108 (90 Base) MCG/ACT inhaler Inhale 2 puffs into the lungs every 4 (four) hours as needed for wheezing or shortness of breath. 1 Inhaler 0  . benzonatate (TESSALON) 200 MG capsule Take 1 capsule (200 mg total) by mouth 3 (three) times daily as needed for cough. 30 capsule 0  . Biotin 1 MG CAPS Take 1 mg by mouth daily.    . chlorthalidone (HYGROTON) 25 MG tablet Take 25 mg by mouth daily.  11  . chlorthalidone (HYGROTON) 25 MG tablet TAKE 1 TABLET BY MOUTH EVERY DAY 90 tablet 1  . cholecalciferol (VITAMIN D) 1000 units tablet Take 1,000 Units by mouth daily.    . Ferrous Sulfate (IRON) 325 (65 Fe) MG TABS Take 325 mg by mouth daily.    . fluticasone (FLONASE) 50 MCG/ACT nasal spray Place 2 sprays into both nostrils daily. 16 g 0  . Multiple Vitamin (MULTIVITAMIN) tablet Take 1 tablet by mouth daily.    . Probiotic Product (PROBIOTIC-10 PO) Take 1 tablet by mouth daily.    . chlorpheniramine-HYDROcodone (TUSSIONEX PENNKINETIC ER) 10-8 MG/5ML SUER Take 5 mLs by mouth every 12 (twelve) hours as needed for cough. 120 mL 0  . cyclobenzaprine (FLEXERIL) 5 MG tablet Take 1 tablet (5 mg total) by mouth at bedtime. 15 tablet 0  . ibuprofen (ADVIL,MOTRIN) 600 MG tablet Take 1 tablet (600 mg total) by mouth every 6 (six) hours as needed. (Patient not taking: Reported on 01/21/2018) 30 tablet 0  . magnesium 30 MG tablet Take 30 mg by mouth 2 (two) times daily.    . norethindrone (MICRONOR,CAMILA,ERRIN) 0.35 MG tablet TAKE 1 TABLET BY MOUTH EVERY DAY 28 tablet 0   No facility-administered medications prior to visit.     Allergies  Allergen Reactions  . Hctz [Hydrochlorothiazide]     itching  . Amlodipine Besylate   . Irbesartan Hives    Pt reports causes "hives."    ROS Review of Systems Review of Systems  Constitutional: Negative for activity change, appetite change, chills and fever.  HENT:  Negative for congestion, nosebleeds, trouble swallowing and voice change.   Respiratory: Negative for cough, shortness of breath and wheezing.   Gastrointestinal: Negative for diarrhea, nausea and vomiting.  Genitourinary: Negative for difficulty urinating, dysuria, flank pain and hematuria.  Musculoskeletal: Negative for back pain, joint swelling and neck pain.  Neurological: Negative for dizziness, speech difficulty, light-headedness and numbness.  See HPI. All other review of systems negative.     Objective:   BP 128/84 (BP Location: Left Arm, Patient Position: Sitting, Cuff Size: Large)   Pulse  90   Temp 98.7 F (37.1 C) (Oral)   Resp 17   Ht 5\' 3"  (1.6 m)   Wt 190 lb 12.8 oz (86.5 kg)   LMP 12/31/2017   SpO2 100%   BMI 33.80 kg/m  Wt Readings from Last 3 Encounters:  01/21/18 190 lb 12.8 oz (86.5 kg)  09/04/17 190 lb (86.2 kg)  07/15/17 188 lb (85.3 kg)    Physical Exam  Physical Exam  Constitutional: Oriented to person, place, and time. Appears well-developed and well-nourished.  HENT:  Head: Normocephalic and atraumatic.  Eyes: Conjunctivae and EOM are normal.  Cardiovascular: Normal rate, regular rhythm, normal heart sounds and intact distal pulses.  No murmur heard. Pulmonary/Chest: Effort normal and breath sounds normal. No stridor. No respiratory distress. Has no wheezes.  Abdomen: non-distended, soft, normoactive bs, non-tender Neurological: Is alert and oriented to person, place, and time.  Skin: Skin is warm. Capillary refill takes less than 2 seconds.  Psychiatric: Has a normal mood and affect. Behavior is normal. Judgment and thought content normal.    Health Maintenance Due  Topic Date Due  . TETANUS/TDAP  10/03/1985  . COLONOSCOPY  10/03/2016  . INFLUENZA VACCINE  09/11/2017  . PAP SMEAR  12/29/2017    There are no preventive care reminders to display for this patient.  Lab Results  Component Value Date   TSH 1.370 10/16/2016   Lab Results    Component Value Date   WBC 3.4 07/15/2017   HGB 10.5 (L) 07/15/2017   HCT 31.7 (L) 07/15/2017   MCV 82 07/15/2017   PLT 289 07/15/2017   Lab Results  Component Value Date   NA 138 11/01/2016   K 4.6 11/01/2016   CO2 25 11/01/2016   GLUCOSE 59 (L) 11/01/2016   BUN 10 11/01/2016   CREATININE 0.84 11/01/2016   BILITOT 0.4 10/14/2013   ALKPHOS 80 10/14/2013   AST 14 10/14/2013   ALT 11 10/14/2013   PROT 7.8 10/14/2013   ALBUMIN 3.2 (L) 10/14/2013   CALCIUM 9.4 11/01/2016   ANIONGAP 3 (L) 05/02/2014      Assessment & Plan:   Problem List Items Addressed This Visit      Cardiovascular and Mediastinum   Essential hypertension, benign - Primary - discussed her goal so bp Will monitor labs and renal function  cpm   Relevant Orders   CBC with Differential   Lipid panel   Microalbumin, urine    Other Visit Diagnoses    Need for prophylactic vaccination and inoculation against influenza       Relevant Orders   Flu Vaccine QUAD 36+ mos IM   Screen for colon cancer       Relevant Orders   Ambulatory referral to Gastroenterology   Anemia due to chronic blood loss    -  Discussed that she should follow up with Gynecology   Relevant Orders   CBC with Differential   Vitamin D deficiency    -  Discussed vitamin D supplementation    Relevant Orders   VITAMIN D 25 Hydroxy (Vit-D Deficiency, Fractures)      No orders of the defined types were placed in this encounter.   Follow-up: No follow-ups on file.    Forrest Moron, MD

## 2018-01-22 ENCOUNTER — Encounter: Payer: Self-pay | Admitting: Family Medicine

## 2018-01-22 ENCOUNTER — Other Ambulatory Visit: Payer: Self-pay | Admitting: Family Medicine

## 2018-01-22 LAB — CBC WITH DIFFERENTIAL/PLATELET
BASOS: 0 %
Basophils Absolute: 0 10*3/uL (ref 0.0–0.2)
EOS (ABSOLUTE): 0.1 10*3/uL (ref 0.0–0.4)
Eos: 2 %
Hematocrit: 31.2 % — ABNORMAL LOW (ref 34.0–46.6)
Hemoglobin: 10.2 g/dL — ABNORMAL LOW (ref 11.1–15.9)
Immature Grans (Abs): 0 10*3/uL (ref 0.0–0.1)
Immature Granulocytes: 0 %
Lymphocytes Absolute: 1 10*3/uL (ref 0.7–3.1)
Lymphs: 28 %
MCH: 27.4 pg (ref 26.6–33.0)
MCHC: 32.7 g/dL (ref 31.5–35.7)
MCV: 84 fL (ref 79–97)
MONOS ABS: 0.5 10*3/uL (ref 0.1–0.9)
Monocytes: 13 %
NEUTROS ABS: 2 10*3/uL (ref 1.4–7.0)
Neutrophils: 57 %
PLATELETS: 306 10*3/uL (ref 150–450)
RBC: 3.72 x10E6/uL — ABNORMAL LOW (ref 3.77–5.28)
RDW: 13.6 % (ref 12.3–15.4)
WBC: 3.5 10*3/uL (ref 3.4–10.8)

## 2018-01-22 LAB — LIPID PANEL
CHOLESTEROL TOTAL: 215 mg/dL — AB (ref 100–199)
Chol/HDL Ratio: 2.9 ratio (ref 0.0–4.4)
HDL: 73 mg/dL (ref >39)
LDL Calculated: 131 mg/dL — ABNORMAL HIGH (ref 0–99)
Triglycerides: 54 mg/dL (ref 0–149)
VLDL Cholesterol Cal: 11 mg/dL (ref 5–40)

## 2018-01-22 LAB — VITAMIN D 25 HYDROXY (VIT D DEFICIENCY, FRACTURES): VIT D 25 HYDROXY: 15.3 ng/mL — AB (ref 30.0–100.0)

## 2018-01-22 LAB — MICROALBUMIN, URINE: Microalbumin, Urine: 23.8 ug/mL

## 2018-01-22 MED ORDER — VITAMIN D (ERGOCALCIFEROL) 1.25 MG (50000 UNIT) PO CAPS: 50000.0000 [IU] | ORAL_CAPSULE | ORAL | 3 refills | Status: DC

## 2018-01-26 ENCOUNTER — Ambulatory Visit: Payer: No Typology Code available for payment source | Admitting: Obstetrics and Gynecology

## 2018-01-26 NOTE — Progress Notes (Deleted)
52 y.o. G24P0101 Married Black or [place] American Not Hispanic or Latino female here for annual exam.      Patient's last menstrual period was 12/31/2017.          Sexually active: {yes no:314532}  The current method of family planning is {contraception:315051}.    Exercising: {yes no:314532}  {types:19826} Smoker:  {YES P5382123  Health Maintenance: Pap:  12/30/14  History of abnormal Pap:  no MMG:  12/11/16 Density C/ Bi-rads 1 neg BMD:   None Colonoscopy: None  TDaP:  Not Sure  Gardasil: NA   reports that she has never smoked. She has never used smokeless tobacco. She reports current alcohol use. She reports that she does not use drugs.  Past Medical History:  Diagnosis Date  . Fibroids   . Headache(784.0)   . HTN (hypertension)    no meds  . Iron deficiency anemia secondary to blood loss (chronic)   . Menorrhagia   . SVD (spontaneous vaginal delivery)    x 1    Past Surgical History:  Procedure Laterality Date  . DILITATION & CURRETTAGE/HYSTROSCOPY WITH VERSAPOINT RESECTION N/A 06/08/2012   Procedure: DILATATION & CURETTAGE/HYSTEROSCOPY WITH VERSAPOINT RESECTION;  Surgeon: Lyman Speller, MD;  Location: [facility];  Service: Gynecology;  Laterality: N/A;  . WISDOM TOOTH EXTRACTION      Current Outpatient Medications  Medication Sig Dispense Refill  . albuterol (PROVENTIL HFA;VENTOLIN HFA) 108 (90 Base) MCG/ACT inhaler Inhale 2 puffs into the lungs every 4 (four) hours as needed for wheezing or shortness of breath. 1 Inhaler 0  . benzonatate (TESSALON) 200 MG capsule Take 1 capsule (200 mg total) by mouth 3 (three) times daily as needed for cough. 30 capsule 0  . Biotin 1 MG CAPS Take 1 mg by mouth daily.    . chlorthalidone (HYGROTON) 25 MG tablet Take 25 mg by mouth daily.  11  . chlorthalidone (HYGROTON) 25 MG tablet TAKE 1 TABLET BY MOUTH EVERY DAY 90 tablet 1  . cholecalciferol (VITAMIN D) 1000 units tablet Take 1,000 Units by mouth daily.    . Ferrous Sulfate  (IRON) 325 (65 Fe) MG TABS Take 325 mg by mouth daily.    . fluticasone (FLONASE) 50 MCG/ACT nasal spray Place 2 sprays into both nostrils daily. 16 g 0  . Multiple Vitamin (MULTIVITAMIN) tablet Take 1 tablet by mouth daily.    . Probiotic Product (PROBIOTIC-10 PO) Take 1 tablet by mouth daily.    . Vitamin D, Ergocalciferol, (DRISDOL) 1.25 MG (50000 UT) CAPS capsule Take 1 capsule (50,000 Units total) by mouth every 7 (seven) days. 12 capsule 3   No current facility-administered medications for this visit.     Family History  Problem Relation Age of Onset  . Diabetes Father   . Hypertension Father   . Lung cancer Father   . Hypertension Mother   . Congestive Heart Failure Mother   . Hypertension Brother   . Hypertension Brother   . Hypertension Brother   . Hypertension Sister   . Heart disease Brother   . Heart attack Brother   . Breast cancer Paternal Grandmother   . Heart disease Maternal Grandmother        pacemaker  . Hypertension Maternal Grandmother   . Lung cancer Paternal Grandfather     Review of Systems  Exam:   LMP 12/31/2017   Weight change: @WEIGHTCHANGE @ Height:      Ht Readings from Last 3 Encounters:  01/21/18 5\' 3"  (1.6 m)  01/09/17 5\' 3"  (1.6 m)  12/04/16 5\' 3"  (1.6 m)    General appearance: alert, cooperative and appears stated age Head: Normocephalic, without obvious abnormality, atraumatic Neck: no adenopathy, supple, symmetrical, trachea midline and thyroid {CHL AMB PHY EX THYROID NORM DEFAULT:2365708512::"normal to inspection and palpation"} Lungs: clear to auscultation bilaterally Cardiovascular: regular rate and rhythm Breasts: {Exam; breast:13139::"normal appearance, no masses or tenderness"} Abdomen: soft, non-tender; non distended,  no masses,  no organomegaly Extremities: extremities normal, atraumatic, no cyanosis or edema Skin: Skin color, texture, turgor normal. No rashes or lesions Lymph nodes: Cervical, supraclavicular, and axillary  nodes normal. No abnormal inguinal nodes palpated Neurologic: Grossly normal   Pelvic: External genitalia:  no lesions              Urethra:  normal appearing urethra with no masses, tenderness or lesions              Bartholins and Skenes: normal                 Vagina: normal appearing vagina with normal color and discharge, no lesions              Cervix: {CHL AMB PHY EX CERVIX NORM DEFAULT:9794192969::"no lesions"}               Bimanual Exam:  Uterus:  {CHL AMB PHY EX UTERUS NORM [phone]::"normal size, contour, position, consistency, mobility, non-tender"}              Adnexa: {CHL AMB PHY EX ADNEXA NO MASS DEFAULT:[phone]::"no mass, fullness, tenderness"}               Rectovaginal: Confirms               Anus:  normal sphincter tone, no lesions  Chaperone was present for exam.  A:  Well Woman with normal exam  P:

## 2018-01-29 NOTE — Progress Notes (Signed)
52 y.o. G57P0101 Married Black or [place] American Not Hispanic or Latino female here for annual exam.  The patient has a known fibroid uterus, h/o menorrhagia and anemia. In 6/19 she was given a script for the minipill to try and lighten her cycles. She tried the pill for a while, but stopped it secondary to side effects. Last hgb was 10.2, it was done earlier this month with her primary. She has been taking slow fe 1 x a day.  At her last ultrasound she had one type 1 fibroid, not sure if other fibroids were deviating her cavity. Symptoms are tolerable to her. Cramps are helped with midol.  Period Cycle (Days): 28 Period Duration (Days): 5-7 days Period Pattern: Regular Menstrual Flow: Heavy Menstrual Control: Tampon, Thin pad Menstrual Control Change Freq (Hours): changes tampon/pad every 2 hours Dysmenorrhea: (!) Moderate Dysmenorrhea Symptoms: Cramping  No dyspareunia. No baseline pain. She voids 5-6 x a day, drinks a lot of water. Nocturia 3-4 x a night, voids normal amounts.   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 She notices little itchy bumps intermittently in the same area on her buttock, also gets bumps on her finger. Never painful.   Patient's last menstrual period was 12/31/2017 (exact date).          Sexually active: Yes.    The current method of family planning is none.    Exercising: Yes.    yoga, walking Smoker:  no  Health Maintenance: Pap:   12/30/14 WNL History of abnormal Pap:  no MMG:  12/11/16 BIRADS 1 negative/density c Colonoscopy:  None BMD:   None TDaP:  Up to date Gardasil: N/A   reports that she has never smoked. She has never used smokeless tobacco. She reports current alcohol use. She reports that she does not use drugs. Just occasional ETOH. She moved here from [place], husband is a Theme park manager. Son is college in [employer], senior.   Past Medical History:  Diagnosis Date  . Fibroids   . Headache(784.0)   . HTN (hypertension)     no meds  . Iron deficiency anemia secondary to blood loss (chronic)   . Menorrhagia   . SVD (spontaneous vaginal delivery)    x 1    Past Surgical History:  Procedure Laterality Date  . DILITATION & CURRETTAGE/HYSTROSCOPY WITH VERSAPOINT RESECTION N/A 06/08/2012   Procedure: DILATATION & CURETTAGE/HYSTEROSCOPY WITH VERSAPOINT RESECTION;  Surgeon: Lyman Speller, MD;  Location: [facility];  Service: Gynecology;  Laterality: N/A;  . WISDOM TOOTH EXTRACTION      Current Outpatient Medications  Medication Sig Dispense Refill  . albuterol (PROVENTIL HFA;VENTOLIN HFA) 108 (90 Base) MCG/ACT inhaler Inhale 2 puffs into the lungs every 4 (four) hours as needed for wheezing or shortness of breath. 1 Inhaler 0  . benzonatate (TESSALON) 200 MG capsule Take 1 capsule (200 mg total) by mouth 3 (three) times daily as needed for cough. 30 capsule 0  . Biotin 1 MG CAPS Take 1 mg by mouth daily.    . chlorthalidone (HYGROTON) 25 MG tablet TAKE 1 TABLET BY MOUTH EVERY DAY 90 tablet 1  . cholecalciferol (VITAMIN D) 1000 units tablet Take 1,000 Units by mouth daily.    . Cinnamon 500 MG capsule Take 500 mg by mouth daily.    . Ferrous Sulfate (IRON) 325 (65 Fe) MG TABS Take 325 mg by mouth daily.    . fluticasone (FLONASE) 50 MCG/ACT nasal spray Place 2 sprays into  both nostrils daily. 16 g 0  . Multiple Vitamin (MULTIVITAMIN) tablet Take 1 tablet by mouth daily.    . Probiotic Product (PROBIOTIC-10 PO) Take 1 tablet by mouth daily.    . vitamin E 100 UNIT capsule Take by mouth daily.    . Vitamin D, Ergocalciferol, (DRISDOL) 1.25 MG (50000 UT) CAPS capsule Take 1 capsule (50,000 Units total) by mouth every 7 (seven) days. (Patient not taking: Reported on 02/02/2018) 12 capsule 3   No current facility-administered medications for this visit.     Family History  Problem Relation Age of Onset  . Diabetes Father   . Hypertension Father   . Lung cancer Father   . Hypertension Mother   . Congestive Heart  Failure Mother   . Hypertension Brother   . Hypertension Brother   . Hypertension Brother   . Hypertension Sister   . Heart disease Brother   . Heart attack Brother   . Breast cancer Paternal Grandmother   . Heart disease Maternal Grandmother        pacemaker  . Hypertension Maternal Grandmother   . Lung cancer Paternal Grandfather     Review of Systems  Constitutional: Negative.   HENT: Negative.   Eyes: Negative.   Respiratory: Negative.   Cardiovascular: Negative.   Gastrointestinal: Negative.   Endocrine: Negative.   Genitourinary: Negative.   Musculoskeletal: Negative.   Skin: Negative.   Allergic/Immunologic: Negative.   Neurological: Negative.   Hematological: Negative.   Psychiatric/Behavioral: Negative.     Exam:   BP 128/88 (BP Location: Right Arm, Patient Position: Sitting, Cuff Size: Normal)   Pulse 84   Ht 5' 2.6" (1.59 m)   Wt 192 lb 3.2 oz (87.2 kg)   LMP 12/31/2017 (Exact Date)   BMI 34.49 kg/m   Weight change: @WEIGHTCHANGE @ Height:   Height: 5' 2.6" (159 cm)  Ht Readings from Last 3 Encounters:  02/02/18 5' 2.6" (1.59 m)  01/21/18 5\' 3"  (1.6 m)  01/09/17 5\' 3"  (1.6 m)    General appearance: alert, cooperative and appears stated age Head: Normocephalic, without obvious abnormality, atraumatic Neck: no adenopathy, supple, symmetrical, trachea midline and thyroid normal to inspection and palpation Lungs: clear to auscultation bilaterally Cardiovascular: regular rate and rhythm Breasts: normal appearance, no masses or tenderness Abdomen: soft, non-tender; non distended,  no masses,  no organomegaly Extremities: extremities normal, atraumatic, no cyanosis or edema Skin: Skin color, texture, turgor normal. No rashes or lesions Lymph nodes: Cervical, supraclavicular, and axillary nodes normal. No abnormal inguinal nodes palpated Neurologic: Grossly normal   Pelvic: External genitalia:  no lesions              Urethra:  normal appearing urethra  with no masses, tenderness or lesions              Bartholins and Skenes: normal                 Vagina: normal appearing vagina with normal color and discharge, no lesions              Cervix: no lesions               Bimanual Exam:  Uterus:  enlarged, irregularly shaped uterus, not filling her pelvis, ~12-14 week sized              Adnexa: no mass, fullness, tenderness               Rectovaginal: Confirms  Anus:  normal sphincter tone, no lesions  Chaperone was present for exam.  A:  Well Woman with normal exam  Fibroid uterus  Menorrhagia, anemia  P:   Increase iron to BID  Discussed options for treatment, including: mini-pill (she didn't tolerate), depo-provera, Hysteroscopic myomectomy with possible placement of a mirena IUD, uterine artery embolization and hysterectomy  She will increase her iron to BID  It has been 6 months since her last ultrasound  Return for sonohysterogram, discussed hysteroscopic myomectomy and mirena IUD insertion (information given)  Pap with hpv  Mammogram due  Colonoscopy referral placed  Labs with primary  She will return with possible HSV out break for a culture to confirm diagnosis

## 2018-02-02 ENCOUNTER — Other Ambulatory Visit (HOSPITAL_COMMUNITY)
Admission: RE | Admit: 2018-02-02 | Discharge: 2018-02-02 | Disposition: A | Discharge status: Home / Self Care | Payer: No Typology Code available for payment source | Source: Ambulatory Visit | Attending: Obstetrics and Gynecology | Admitting: Obstetrics and Gynecology

## 2018-02-02 ENCOUNTER — Encounter: Payer: Self-pay | Admitting: Obstetrics and Gynecology

## 2018-02-02 ENCOUNTER — Other Ambulatory Visit: Payer: Self-pay

## 2018-02-02 ENCOUNTER — Ambulatory Visit (INDEPENDENT_AMBULATORY_CARE_PROVIDER_SITE_OTHER): Payer: No Typology Code available for payment source | Admitting: Obstetrics and Gynecology

## 2018-02-02 VITALS — BP 128/88 | HR 84 | Ht 62.6 in | Wt 192.2 lb

## 2018-02-02 DIAGNOSIS — Z124 Encounter for screening for malignant neoplasm of cervix: Secondary | ICD-10-CM

## 2018-02-02 DIAGNOSIS — D259 Leiomyoma of uterus, unspecified: Secondary | ICD-10-CM | POA: Diagnosis not present

## 2018-02-02 DIAGNOSIS — Z862 Personal history of diseases of the blood and blood-forming organs and certain disorders involving the immune mechanism: Secondary | ICD-10-CM | POA: Diagnosis not present

## 2018-02-02 DIAGNOSIS — Z01419 Encounter for gynecological examination (general) (routine) without abnormal findings: Secondary | ICD-10-CM

## 2018-02-02 DIAGNOSIS — Z1211 Encounter for screening for malignant neoplasm of colon: Secondary | ICD-10-CM

## 2018-02-09 ENCOUNTER — Telehealth: Payer: Self-pay | Admitting: Obstetrics and Gynecology

## 2018-02-09 LAB — CYTOLOGY - PAP
Diagnosis: NEGATIVE
HPV: NOT DETECTED

## 2018-02-09 NOTE — Telephone Encounter (Signed)
Call placed to patient to review benefits for recommended sonohysterogram. Left voicemail message requesting a return call

## 2018-02-09 NOTE — Telephone Encounter (Signed)
Patient returned call. Reviewed benefit for a sonohysterogram. Patient understood information presented. Patient requested to schedule at the end of January 2020. Advised patient to call at the onset of her cycle in January for scheduling. Patient understood and is agreeable.   Forwarding to Dr Talbert Nan for final review. Patient is agreeable to disposition. Will defer referral to mid-January. Will close encounter  cc: Lamont Snowball, RN

## 2018-03-20 ENCOUNTER — Encounter: Payer: Self-pay | Admitting: Family Medicine

## 2018-03-31 ENCOUNTER — Telehealth: Payer: Self-pay | Admitting: Obstetrics and Gynecology

## 2018-03-31 NOTE — Telephone Encounter (Signed)
Call placed to patient to follow up in regards to scheduled recommended appointment. Left voicemail message requesting a return call   cc: Lamont Snowball, RN

## 2018-04-09 NOTE — Telephone Encounter (Signed)
Call placed to follow up in regards to scheduling recommended sonohysterogram. Left voicemail message requesting a return call   cc: Lamont Snowball, RN

## 2018-04-16 ENCOUNTER — Other Ambulatory Visit: Payer: Self-pay | Admitting: Family Medicine

## 2018-04-16 ENCOUNTER — Telehealth: Payer: Self-pay | Admitting: Obstetrics and Gynecology

## 2018-04-16 DIAGNOSIS — Z1231 Encounter for screening mammogram for malignant neoplasm of breast: Secondary | ICD-10-CM

## 2018-04-16 NOTE — Telephone Encounter (Signed)
Yes, please.

## 2018-04-16 NOTE — Telephone Encounter (Signed)
Patient called requesting information about about genetic testing for breast cancer due to family risk.

## 2018-04-16 NOTE — Telephone Encounter (Signed)
Dr. Talbert Nan,  Okay to send standard letter for ultrasound/sonohysterogram?   Ordered on 02/02/18 for evaluation:  menorrhagia, anemia, fibroid uterus, assesment for hysteroscopic resection

## 2018-04-17 ENCOUNTER — Ambulatory Visit
Admission: RE | Admit: 2018-04-17 | Discharge: 2018-04-17 | Disposition: A | Discharge status: Home / Self Care | Payer: No Typology Code available for payment source | Source: Ambulatory Visit | Attending: Family Medicine | Admitting: Family Medicine

## 2018-04-17 ENCOUNTER — Encounter: Payer: Self-pay | Admitting: Emergency Medicine

## 2018-04-17 DIAGNOSIS — Z1231 Encounter for screening mammogram for malignant neoplasm of breast: Secondary | ICD-10-CM

## 2018-04-17 NOTE — Telephone Encounter (Signed)
Message left to return call to [place] at [phone].   Also, needs to schedule Pelvic ultrasound  With Dr. Talbert Nan.  Orders have expired. Needs order for evaluation of fibroids and menorrhagia. Letter being sent to patient regarding need.

## 2018-04-17 NOTE — Telephone Encounter (Signed)
Letter sent to home address of record.  Encounter closed.

## 2018-04-21 ENCOUNTER — Encounter: Payer: Self-pay | Admitting: Obstetrics and Gynecology

## 2018-05-07 NOTE — Telephone Encounter (Signed)
Left message to call Kaitlyn at [phone]. 

## 2018-05-13 NOTE — Telephone Encounter (Signed)
Patient has been sent a letter regarding scheduling PUS. Patient has not returned call to discuss questions about genetic testing after 2 calls to patient to discuss.   Routing to provider and will close encounter.

## 2018-06-29 ENCOUNTER — Ambulatory Visit: Payer: Self-pay | Admitting: *Deleted

## 2018-06-29 NOTE — Telephone Encounter (Signed)
Pt called with having symptoms of coronavirus and would like to be tested. She has symptoms of body aches, chest congestion and headaches. She has been in contact with her sister in law a couple of weeks ago that tested positive for the virus. She does not have a fever, chest pain or any GI symptoms. Advised pt of having a virtual visit first and then be scheduled for the test per her provider. PCP notified regarding an appointment. Call conference with the patient.  Reason for Disposition . [1] Mild body aches, chills, diarrhea, headache, runny nose, or sore throat AND [2] within 14 days of COVID-19 EXPOSURE  Answer Assessment - Initial Assessment Questions 1. CLOSE CONTACT: "Who is the person with the confirmed or suspected COVID-19 infection that you were exposed to?"     Sister in law 2. PLACE of CONTACT: "Where were you when you were exposed to COVID-19?" (e.g., home, school, medical waiting room; which city?)   Close contact 3. TYPE of CONTACT: "How much contact was there?" (e.g., sitting next to, live in same house, work in same office, same building)     Sitting one sit over 4. DURATION of CONTACT: "How long were you in contact with the COVID-19 patient?" (e.g., a few seconds, passed by person, a few minutes, live with the patient)     An hour 5. DATE of CONTACT: "When did you have contact with a COVID-19 patient?" (e.g., how many days ago)     May 9th 6. TRAVEL: "Have you traveled out of the country recently?" If so, "When and where?"     * Also ask about out-of-state travel, since the CDC has identified some high risk cities for community spread in the [place].     * Note: Travel becomes less relevant if there is widespread community transmission where the patient lives.     Traveled to [place] 7. COMMUNITY SPREAD: "Are there lots of cases of COVID-19 (community spread) where you live?" (See public health department website, if unsure)   * MAJOR community spread: high number of  cases; numbers of cases are increasing; many people hospitalized.   * MINOR community spread: low number of cases; not increasing; few or no people hospitalized     community spread 8. SYMPTOMS: "Do you have any symptoms?" (e.g., fever, cough, breathing difficulty)     Body aches, chest congestion, coughing, headaches 9. PREGNANCY OR POSTPARTUM: "Is there any chance you are pregnant?" "When was your last menstrual period?" "Did you deliver in the last 2 weeks?"     LMP last week 10. HIGH RISK: "Do you have any heart or lung problems? Do you have a weak immune system?" (e.g., CHF, COPD, asthma, HIV positive, chemotherapy, renal failure, diabetes mellitus, sickle cell anemia)       High blood pressure,chronic bronchitis,  Protocols used: CORONAVIRUS (COVID-19) EXPOSURE-A-AH

## 2018-06-30 ENCOUNTER — Other Ambulatory Visit: Payer: No Typology Code available for payment source

## 2018-06-30 ENCOUNTER — Telehealth (INDEPENDENT_AMBULATORY_CARE_PROVIDER_SITE_OTHER): Payer: No Typology Code available for payment source | Admitting: Registered Nurse

## 2018-06-30 ENCOUNTER — Other Ambulatory Visit: Payer: Self-pay

## 2018-06-30 ENCOUNTER — Telehealth: Payer: Self-pay | Admitting: *Deleted

## 2018-06-30 DIAGNOSIS — R6889 Other general symptoms and signs: Secondary | ICD-10-CM | POA: Diagnosis not present

## 2018-06-30 DIAGNOSIS — Z20822 Contact with and (suspected) exposure to covid-19: Secondary | ICD-10-CM

## 2018-06-30 DIAGNOSIS — U071 COVID-19: Secondary | ICD-10-CM

## 2018-06-30 HISTORY — DX: COVID-19: U07.1

## 2018-06-30 NOTE — Progress Notes (Signed)
Pt states for the last 4 days she has been having COVID-19 symptoms. Pt states she has been having chills/sweats, headache,cough with no SHOB or chest pain,fatigue, and some fever.  Pt has been in the home since symptoms has started.

## 2018-06-30 NOTE — Telephone Encounter (Signed)
Contacted pt to schedule COVID; pt offered and accepted appointment at [facility] site 06/30/2018 at 1145; pt given address, directions, and instructions that she and any occupants of her vehicle need to wear a mask; she verbalized understanding; orders placed per protocol; will route to office for notification.

## 2018-06-30 NOTE — Progress Notes (Signed)
Telemedicine Encounter- SOAP NOTE Established Patient  This telephone encounter was conducted with the patient's (or proxy's) verbal consent via audio telecommunications: yes   Patient was instructed to have this encounter in a suitably private space; and to only have persons present to whom they give permission to participate. In addition, patient identity was confirmed by use of name plus two identifiers (DOB and address).  I discussed the limitations, risks, security and privacy concerns of performing an evaluation and management service by telephone and the availability of in person appointments. I also discussed with the patient that there may be a patient responsible charge related to this service. The patient expressed understanding and agreed to proceed.  I spent a total of 22 minutes talking with the patient or their proxy. The patient's husband was on the call with the patient's consent.     Subjective   Belinda Petersen is a 53 y.o. established patient. Telephone visit today for suspected COVID-19 infection  Pt reports being at funeral for brother in law on Saturday, May 9th with her husband. Since then, her sister in law has tested positive for COVID-19. She states she had been wearing a mask and gloves at the funeral.   On Friday, May 15th, she started developing symptoms of a scratchy throat, which has developed into a dry cough, fever, chills, sweats, feverish feeling, and headaches. She denies any increased SOB, change in taste/smell, GI disturbances.  She lives with her husband, who has started presenting similar symptoms starting Monday, May 18th. They have been isolated in their home since the start of her symptoms.     Patient Active Problem List   Diagnosis Date Noted  . DOE (dyspnea on exertion) 05/04/2014  . Hypertensive urgency 10/14/2013  . Chest pain 10/13/2013  . Fibroids, submucosal 06/08/2012  . Menorrhagia 06/08/2012  . Fibroid uterus 04/29/2012  .  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 Essential hypertension, benign 04/29/2012    Past Medical History:  Diagnosis Date  . Fibroids   . Headache(784.0)   . HTN (hypertension)    no meds  . Iron deficiency anemia secondary to blood loss (chronic)   . Menorrhagia   . SVD (spontaneous vaginal delivery)    x 1    Current Outpatient Medications  Medication Sig Dispense Refill  . albuterol (PROVENTIL HFA;VENTOLIN HFA) 108 (90 Base) MCG/ACT inhaler Inhale 2 puffs into the lungs every 4 (four) hours as needed for wheezing or shortness of breath. 1 Inhaler 0  . Biotin 1 MG CAPS Take 1 mg by mouth daily.    . chlorthalidone (HYGROTON) 25 MG tablet TAKE 1 TABLET BY MOUTH EVERY DAY 90 tablet 1  . cholecalciferol (VITAMIN D) 1000 units tablet Take 1,000 Units by mouth daily.    . Cinnamon 500 MG capsule Take 500 mg by mouth daily.    . Ferrous Sulfate (IRON) 325 (65 Fe) MG TABS Take 325 mg by mouth daily.    . fluticasone (FLONASE) 50 MCG/ACT nasal spray Place 2 sprays into both nostrils daily. 16 g 0  . Multiple Vitamin (MULTIVITAMIN) tablet Take 1 tablet by mouth daily.    . Probiotic Product (PROBIOTIC-10 PO) Take 1 tablet by mouth daily.    . vitamin E 100 UNIT capsule Take by mouth daily.     No current facility-administered medications for this visit.     Allergies  Allergen Reactions  . Hctz [Hydrochlorothiazide]     itching  . Amlodipine  Besylate   . Irbesartan Hives    Pt reports causes "hives."    Social History   Socioeconomic History  . Marital status: Married    Spouse name: Not on file  . Number of children: 1  . Years of education: Not on file  . Highest education level: Not on file  Occupational History    Employer: [employer]  Social Needs  . Financial resource strain: Not on file  . Food insecurity:    Worry: Not on file    Inability: Not on file  . Transportation needs:    Medical: Not on file     Non-medical: Not on file  Tobacco Use  . Smoking status: Never Smoker  . Smokeless tobacco: Never Used  Substance and Sexual Activity  . Alcohol use: Yes    Alcohol/week: 0.0 standard drinks    Comment: 1 glass twice a month  . Drug use: No  . Sexual activity: Yes    Partners: Male    Birth control/protection: None  Lifestyle  . Physical activity:    Days per week: Not on file    Minutes per session: Not on file  . Stress: Not on file  Relationships  . Social connections:    Talks on phone: Not on file    Gets together: Not on file    Attends religious service: Not on file    Active member of club or organization: Not on file    Attends meetings of clubs or organizations: Not on file    Relationship status: Not on file  . Intimate partner violence:    Fear of current or ex partner: Not on file    Emotionally abused: Not on file    Physically abused: Not on file    Forced sexual activity: Not on file  Other Topics Concern  . Not on file  Social History Narrative  . Not on file    Review of Systems  Constitutional: Positive for chills, diaphoresis, fever and malaise/fatigue.  HENT: Positive for sore throat. Negative for congestion.   Eyes: Negative.   Respiratory: Positive for cough.   Cardiovascular: Negative.   Gastrointestinal: Negative for abdominal pain, constipation, diarrhea, nausea and vomiting.  Musculoskeletal: Positive for myalgias.  Neurological: Positive for headaches. Negative for dizziness, sensory change, loss of consciousness and weakness.  Endo/Heme/Allergies: Negative.   Psychiatric/Behavioral: Negative.     Objective   Vitals as reported by the patient: There were no vitals filed for this visit.  Diagnoses and all orders for this visit:  Suspected Covid-19 Virus Infection  PLAN:  Patient to be referred to [place] testing center for COVID-19 test. Will follow up with patient and PCP Dr. Nolon Rod.  Discussed self-isolation in the home.  Discussed supportive care at home: hydration, rest, well rounded meals  Discussed reasons to present to ED: moderate to severe SOB, dizziness, confusion, high fever  Patient encouraged to call clinic with any questions, comments, or concerns.  I spent 18 minutes with this patient, more than 50% of which was spent counseling/educating.   I discussed the assessment and treatment plan with the patient. The patient was provided an opportunity to ask questions and all were answered. The patient agreed with the plan and demonstrated an understanding of the instructions.   The patient was advised to call back or seek an in-person evaluation if the symptoms worsen or if the condition fails to improve as anticipated.  I provided 18 minutes of non-face-to-face time during this  encounter.  Maximiano Coss, NP  Primary Care at [facility]

## 2018-07-01 ENCOUNTER — Telehealth: Payer: Self-pay | Admitting: *Deleted

## 2018-07-01 NOTE — Telephone Encounter (Signed)
Pt called inquiring about the results of COVID test obtained on 5/19//2020; explained to pt that it can take 48-72 hours for results; also notified pt that this may be delayed due to the number of tests that have to be processed; she would also like to know if she and her husband must continue quarantine until their results are received; explained that she should continue until she receives her results; rshe  verbalized understanding.

## 2018-07-03 ENCOUNTER — Telehealth: Payer: Self-pay

## 2018-07-03 ENCOUNTER — Telehealth: Payer: Self-pay | Admitting: Registered Nurse

## 2018-07-03 DIAGNOSIS — U071 COVID-19: Secondary | ICD-10-CM

## 2018-07-03 LAB — NOVEL CORONAVIRUS, NAA: SARS-CoV-2, NAA: DETECTED — AB

## 2018-07-03 MED ORDER — BENZONATATE 100 MG PO CAPS: 100.0000 mg | ORAL_CAPSULE | Freq: Three times a day (TID) | ORAL | 0 refills | Status: AC | PRN

## 2018-07-03 NOTE — Telephone Encounter (Signed)
Lab reporting positive Covid-19  Results.  Will rout to Dr.  Wynona Luna and Dr. Nolon Rod

## 2018-07-03 NOTE — Telephone Encounter (Signed)
Pt needing appt with Nolon Rod

## 2018-07-03 NOTE — Telephone Encounter (Signed)
Called patient to discuss positive COVID test - patient reports both herself and her husband are feeling much better, with symptoms starting to improve on Wednesday. We reviewed that the course of this virus may include symptoms worsening and that a full 72 hours without symptoms is recommended before ending social isolation.   Calling in [place] for cough  Kathrin Ruddy NP

## 2018-07-05 ENCOUNTER — Telehealth (HOSPITAL_COMMUNITY): Payer: Self-pay | Admitting: Internal Medicine

## 2018-07-05 NOTE — Telephone Encounter (Signed)
Spoke briefly with patient when calling to check on husband, who also had a positive Covid result.  They are improving.  Maximiano Coss NP spoke with patient about positive result Fri 5/22.  LM

## 2018-07-08 ENCOUNTER — Encounter: Payer: No Typology Code available for payment source | Admitting: Family Medicine

## 2018-07-08 ENCOUNTER — Other Ambulatory Visit: Payer: Self-pay

## 2018-07-08 ENCOUNTER — Encounter: Payer: Self-pay | Admitting: Family Medicine

## 2018-07-08 NOTE — Telephone Encounter (Signed)
Called pt her and her husband have appts for this issue

## 2018-07-08 NOTE — Progress Notes (Signed)
Pt c/o positive COVID. Still having some SOB and would like an Xray.

## 2018-07-09 ENCOUNTER — Ambulatory Visit: Payer: Self-pay

## 2018-07-09 ENCOUNTER — Emergency Department (HOSPITAL_COMMUNITY): Payer: No Typology Code available for payment source

## 2018-07-09 ENCOUNTER — Other Ambulatory Visit: Payer: Self-pay

## 2018-07-09 ENCOUNTER — Emergency Department (HOSPITAL_COMMUNITY)
Admission: EM | Admit: 2018-07-09 | Discharge: 2018-07-09 | Disposition: A | Discharge status: Home / Self Care | Payer: No Typology Code available for payment source | Attending: Emergency Medicine | Admitting: Emergency Medicine

## 2018-07-09 ENCOUNTER — Encounter (HOSPITAL_COMMUNITY): Payer: Self-pay | Admitting: Emergency Medicine

## 2018-07-09 DIAGNOSIS — Z79899 Other long term (current) drug therapy: Secondary | ICD-10-CM | POA: Insufficient documentation

## 2018-07-09 DIAGNOSIS — I10 Essential (primary) hypertension: Secondary | ICD-10-CM | POA: Insufficient documentation

## 2018-07-09 DIAGNOSIS — J988 Other specified respiratory disorders: Secondary | ICD-10-CM

## 2018-07-09 DIAGNOSIS — U071 COVID-19: Secondary | ICD-10-CM | POA: Diagnosis not present

## 2018-07-09 DIAGNOSIS — M791 Myalgia, unspecified site: Secondary | ICD-10-CM | POA: Insufficient documentation

## 2018-07-09 DIAGNOSIS — R5383 Other fatigue: Secondary | ICD-10-CM | POA: Insufficient documentation

## 2018-07-09 DIAGNOSIS — R05 Cough: Secondary | ICD-10-CM | POA: Diagnosis not present

## 2018-07-09 DIAGNOSIS — R0602 Shortness of breath: Secondary | ICD-10-CM | POA: Diagnosis present

## 2018-07-09 MED ORDER — ALBUTEROL SULFATE HFA 108 (90 BASE) MCG/ACT IN AERS
4.0000 | INHALATION_SPRAY | Freq: Once | RESPIRATORY_TRACT | Status: AC
  Administered 2018-07-09: 4 via RESPIRATORY_TRACT
  Filled 2018-07-09: qty 6.7

## 2018-07-09 MED ORDER — ACETAMINOPHEN 325 MG PO TABS
650.0000 mg | ORAL_TABLET | Freq: Once | ORAL | Status: AC
  Administered 2018-07-09: 16:00:00 650 mg via ORAL
  Filled 2018-07-09: qty 2

## 2018-07-09 MED ORDER — AEROCHAMBER PLUS FLO-VU MISC
1.0000 | Freq: Once | Status: AC
  Administered 2018-07-09: 16:00:00 1
  Filled 2018-07-09: qty 1

## 2018-07-09 NOTE — ED Notes (Signed)
Discharge instructions reviewed with patient by PA through phone.

## 2018-07-09 NOTE — Telephone Encounter (Signed)
Incoming call froma Patient with SOB   .  Onset was 2 days ago.   Rates it mild to moderate.  Can denies cardiac and lung Hx.  Have not traveled out of the country.  Reviewed   protocol and provided care advice.PT wishes to go to ED .  Will go there.    Reason for Disposition . [1] Continuous (nonstop) coughing AND [2] keeps from working or sleeping  Answer Assessment - Initial Assessment Questions 1. RESPIRATORY STATUS: "Describe your breathing?" (e.g., wheezing, shortness of breath, unable to speak, severe coughing)      SOB 2. ONSET: "When did this breathing problem begin?"      2 days 3. PATTERN "Does the difficult breathing come and go, or has it been constant since it started?"      Comes snd goes 4. SEVERITY: "How bad is your breathing?" (e.g., mild, moderate, severe)    - MILD: No SOB at rest, mild SOB with walking, speaks normally in sentences, can lay down, no retractions, pulse < 100.    - MODERATE: SOB at rest, SOB with minimal exertion and prefers to sit, cannot lie down flat, speaks in phrases, mild retractions, audible wheezing, pulse 100-120.    - SEVERE: Very SOB at rest, speaks in single words, struggling to breathe, sitting hunched forward, retractions, pulse > 120      Mild to moderare 5. RECURRENT SYMPTOM: "Have you had difficulty breathing before?" If so, ask: "When was the last time?" and "What happened that time?"      yes 6. CARDIAC HISTORY: "Do you have any history of heart disease?" (e.g., heart attack, angina, bypass surgery, angioplasty)     Denies  7. LUNG HISTORY: "Do you have any history of lung disease?"  (e.g., pulmonary embolus, asthma, emphysema)     Denies  8. CAUSE: "What do you think is causing the breathing problem?"      *No Answer* 9. OTHER SYMPTOMS: "Do you have any other symptoms? (e.g., dizziness, runny nose, cough, chest pain, fever)     denies10. PREGNANCY: "Is there any chance you are pregnant?" "When was your last menstrual period?"        11. TRAVEL: "Have you traveled out of the country in the last month?" (e.g., travel history, exposures)       denies  Protocols used: BREATHING DIFFICULTY-A-AH

## 2018-07-09 NOTE — ED Notes (Signed)
X-Ray in room.

## 2018-07-09 NOTE — ED Provider Notes (Signed)
[facility] EMERGENCY DEPARTMENT Provider Note   CSN: [id number] Arrival date & time: 07/09/18  1428    History   Chief Complaint Chief Complaint  Patient presents with   Shortness of Breath    HPI Belinda Petersen is a 53 y.o. female.     Belinda Petersen is a 53 y.o. female history of hypertension, iron deficiency anemia, headaches and uterine fibroids, who presents to the emergency department for evaluation of shortness of breath.  She tested positive for COVID last week, test was performed on 5/19 and she got the results back on 5/2 2.  She reports 2 days ago she started feeling some intermittent shortness of breath and has had a cough with clear sputum production.  She has not had any fevers or chills.  She has had some fatigue and generalized myalgias, initially had a slightly sore throat which has since resolved.  No rhinorrhea.  No associated chest pain.  No abdominal pain, nausea vomiting or diarrhea.  Does report changes in taste and smell.  Her husband also tested positive and has had some similar symptoms and was seen and discharged yesterday.  She has been using an albuterol inhaler she had at home previously but ran out of this today has not been using anything else to treat her symptoms, no other aggravating or alleviating factors.     Past Medical History:  Diagnosis Date   Fibroids    Headache(784.0)    HTN (hypertension)    no meds   Iron deficiency anemia secondary to blood loss (chronic)    Menorrhagia    SVD (spontaneous vaginal delivery)    x 1    Patient Active Problem List   Diagnosis Date Noted   DOE (dyspnea on exertion) 05/04/2014   Hypertensive urgency 10/14/2013   Chest pain 10/13/2013   Fibroids, submucosal 06/08/2012   Menorrhagia 06/08/2012   Fibroid uterus 04/29/2012   Iron deficiency anemia secondary to blood loss (chronic) 04/29/2012   Essential hypertension, benign 04/29/2012    Past Surgical  History:  Procedure Laterality Date   DILITATION & CURRETTAGE/HYSTROSCOPY WITH VERSAPOINT RESECTION N/A 06/08/2012   Procedure: DILATATION & CURETTAGE/HYSTEROSCOPY WITH VERSAPOINT RESECTION;  Surgeon: Lyman Speller, MD;  Location: [facility];  Service: Gynecology;  Laterality: N/A;   WISDOM TOOTH EXTRACTION       OB History    Gravida  1   Para  1   Term  0   Preterm  1   AB  0   Living  1     SAB  0   TAB  0   Ectopic  0   Multiple  0   Live Births  1            Home Medications    Prior to Admission medications   Medication Sig Start Date End Date Taking? Authorizing Provider  albuterol (PROVENTIL HFA;VENTOLIN HFA) 108 (90 Base) MCG/ACT inhaler Inhale 2 puffs into the lungs every 4 (four) hours as needed for wheezing or shortness of breath. 09/04/17   Melynda Ripple, MD  benzonatate (TESSALON PERLES) 100 MG capsule Take 1 capsule (100 mg total) by mouth 3 (three) times daily as needed for up to 7 days for cough (As needed for cough). 07/03/18 07/10/18  Maximiano Coss, NP  Biotin 1 MG CAPS Take 1 mg by mouth daily.    [provider]  chlorthalidone (HYGROTON) 25 MG tablet TAKE 1 TABLET BY MOUTH EVERY DAY 01/13/18   Rosita Fire,  Brittainy M, PA-C  cholecalciferol (VITAMIN D) 1000 units tablet Take 1,000 Units by mouth daily.    [provider]  Cinnamon 500 MG capsule Take 500 mg by mouth daily.    [provider]  Ferrous Sulfate (IRON) 325 (65 Fe) MG TABS Take 325 mg by mouth daily.    [provider]  fluticasone (FLONASE) 50 MCG/ACT nasal spray Place 2 sprays into both nostrils daily. 09/04/17   Melynda Ripple, MD  Multiple Vitamin (MULTIVITAMIN) tablet Take 1 tablet by mouth daily.    [provider]  Probiotic Product (PROBIOTIC-10 PO) Take 1 tablet by mouth daily.    [provider]  vitamin E 100 UNIT capsule Take by mouth daily.    [provider]    Family History Family History  Problem  Relation Age of Onset   Diabetes Father    Hypertension Father    Lung cancer Father    Hypertension Mother    Congestive Heart Failure Mother    Hypertension Brother    Hypertension Brother    Hypertension Brother    Hypertension Sister    Heart disease Brother    Heart attack Brother    Breast cancer Paternal Grandmother    Heart disease Maternal Grandmother        pacemaker   Hypertension Maternal Grandmother    Lung cancer Paternal Grandfather     Social History Social History   Tobacco Use   Smoking status: Never Smoker   Smokeless tobacco: Never Used  Substance Use Topics   Alcohol use: Yes    Alcohol/week: 0.0 standard drinks    Comment: 1 glass twice a month   Drug use: No     Allergies   Hctz [hydrochlorothiazide]; Amlodipine besylate; and Irbesartan   Review of Systems Review of Systems  Constitutional: Negative for chills and fever.  HENT: Positive for sore throat. Negative for congestion, ear pain and rhinorrhea.   Eyes: Negative for visual disturbance.  Respiratory: Positive for cough and shortness of breath. Negative for chest tightness and wheezing.   Cardiovascular: Negative for chest pain.  Gastrointestinal: Negative for abdominal pain, constipation, diarrhea, nausea and vomiting.  Genitourinary: Negative for dysuria.  Musculoskeletal: Positive for myalgias. Negative for arthralgias, neck pain and neck stiffness.  Skin: Negative for color change and rash.  Neurological: Negative for dizziness, syncope, light-headedness and headaches.     Physical Exam Updated Vital Signs BP (!) 127/96 (BP Location: Left Arm)    Pulse 93    Temp 98.8 F (37.1 C) (Oral)    Resp 18    Ht 5\' 3"  (1.6 m)    Wt 85.7 kg    LMP 06/30/2018    SpO2 100%    BMI 33.48 kg/m   Physical Exam Vitals signs and nursing note reviewed.  Constitutional:      General: She is not in acute distress.    Appearance: She is well-developed and normal weight. She is  not ill-appearing, toxic-appearing or diaphoretic.  HENT:     Head: Normocephalic and atraumatic.     Mouth/Throat:     Comments: Posterior oropharynx clear and mucous membranes moist, there is mild erythema but no edema or tonsillar exudates, uvula midline, normal phonation, no trismus, tolerating secretions without difficulty. Eyes:     General:        Right eye: No discharge.        Left eye: No discharge.     Pupils: Pupils are equal, round, and reactive  to light.  Neck:     Musculoskeletal: Neck supple.  Cardiovascular:     Rate and Rhythm: Normal rate and regular rhythm.     Pulses: Normal pulses.     Heart sounds: Normal heart sounds. No murmur. No friction rub. No gallop.   Pulmonary:     Effort: Pulmonary effort is normal. No respiratory distress.     Breath sounds: Normal breath sounds. No wheezing or rales.     Comments: Respirations equal and unlabored, patient able to speak in full sentences, lungs clear to auscultation bilaterally, intermittent cough during exam Chest:     Chest wall: No tenderness.  Abdominal:     General: Bowel sounds are normal. There is no distension.     Palpations: Abdomen is soft. There is no mass.     Tenderness: There is no abdominal tenderness. There is no guarding.     Comments: Abdomen soft, nondistended, nontender to palpation in all quadrants without guarding or peritoneal signs  Musculoskeletal:        General: No deformity.     Right lower leg: She exhibits no tenderness. No edema.     Left lower leg: She exhibits no tenderness. No edema.  Skin:    General: Skin is warm and dry.     Capillary Refill: Capillary refill takes less than 2 seconds.  Neurological:     Mental Status: She is alert and oriented to person, place, and time.     Coordination: Coordination normal.     Comments: Speech is clear, able to follow commands Moves extremities without ataxia, coordination intact   Psychiatric:        Mood and Affect: Mood normal.          Behavior: Behavior normal.      ED Treatments / Results  Labs (all labs ordered are listed, but only abnormal results are displayed) Labs Reviewed - No data to display  EKG EKG Interpretation  Date/Time:  Thursday Jul 09 2018 14:57:22 EDT Ventricular Rate:  84 PR Interval:    QRS Duration: 93 QT Interval:  369 QTC Calculation: 437 R Axis:   30 Text Interpretation:  Sinus rhythm Abnormal R-wave progression, early transition No significant change since last tracing Confirmed by Isla Pence [phone]) on 07/09/2018 4:54:20 PM   Radiology Dg Chest Port 1 View  Result Date: 07/09/2018 CLINICAL DATA:  Shortness of breath, cough EXAM: PORTABLE CHEST 1 VIEW COMPARISON:  05/02/2014 FINDINGS: The heart size and mediastinal contours are within normal limits. Both lungs are clear. The visualized skeletal structures are unremarkable. IMPRESSION: No active disease. Electronically Signed   By: Kathreen Devoid   On: 07/09/2018 17:20    Procedures Procedures (including critical care time)  Medications Ordered in ED Medications  albuterol (VENTOLIN HFA) 108 (90 Base) MCG/ACT inhaler 4 puff (4 puffs Inhalation Given 07/09/18 1531)  aerochamber plus with mask device 1 each (1 each Other Given 07/09/18 1531)  acetaminophen (TYLENOL) tablet 650 mg (650 mg Oral Given 07/09/18 1530)     Initial Impression / Assessment and Plan / ED Course  I have reviewed the triage vital signs and the nursing notes.  Pertinent labs & imaging results that were available during my care of the patient were reviewed by me and considered in my medical decision making (see chart for details).  Patient is now on COVID positive, presenting with 2 days of cough and shortness of breath.  On arrival she has normal vitals with O2 saturation of 100%,  occasionally coughing but in no acute distress.  Lungs clear on exam.  She was ambulated in the room and maintained oxygen saturation greater than 97%.  Will check chest x-ray,  EKG ordered from triage shows sinus rhythm without concerning changes.  Will give albuterol for symptomatic treatment in addition to Tylenol.  Given reassuring vitals I suspect patient's symptoms are certainly caused by a virus but they do not appear to be severe enough to warrant admission.  We will discussed symptomatic treatment for home.  Chest x-ray shows no infiltrates or other cardiopulmonary disease, images reviewed by myself and agree with radiologist findings.  Vitals have remained stable throughout ED stay.  Patient will be discharged home with symptomatic treatment, she has been counseled on continuing to quarantine at home, strict return precautions discussed.  Encourage patient to purchase home pulse oximeter.  She expresses understanding and agreement with plan.  Belinda Petersen was evaluated in Emergency Department on 07/09/2018 for the symptoms described in the history of present illness. She was evaluated in the context of the global COVID-19 pandemic, which necessitated consideration that the patient might be at risk for infection with the SARS-CoV-2 virus that causes COVID-19. Institutional protocols and algorithms that pertain to the evaluation of patients at risk for COVID-19 are in a state of rapid change based on information released by regulatory bodies including the CDC and federal and state organizations. These policies and algorithms were followed during the patient's care in the ED.   Final Clinical Impressions(s) / ED Diagnoses   Final diagnoses:  Respiratory tract infection due to COVID-19 virus  SOB (shortness of breath)    ED Discharge Orders    None       Janet Berlin 07/09/18 1727    Isla Pence, MD 07/09/18 1903

## 2018-07-09 NOTE — ED Notes (Signed)
Patient ambulated in room and maintained oxygen saturation of 97% or greater.

## 2018-07-09 NOTE — ED Triage Notes (Signed)
Patient states she tested positive for Covid 19 last week along with her husband. States she has been having shortness of breath x 2 days along with a cough. Patient states clear mucus with cough. Denies any fevers. Reports feeling fatigued while doing ADLs.

## 2018-07-09 NOTE — Discharge Instructions (Signed)
Please continue to quarantine at home and monitor your symptoms closely. You chest x-ray was clear. Antibiotics are not helpful in treating viral infection, the virus should run its course in about 5-7 days. Please make sure you are drinking plenty of fluids. You can treat your symptoms supportively with tylenol for fevers and pains, and over the counter cough syrups and throat lozenges to help with cough. Albuterol inhaler as needed for shortness of breath. If your symptoms are not improving please follow up with you Primary doctor.   I recommend that you purchase a home pulse ox to help better monitor your oxygen at home, if you start to have increased work of breathing or shortness of breath or your oxygen drops below 90% please immediately return to the hospital for reevaluation.  If you develop persistent fevers, shortness of breath or difficulty breathing, chest pain, severe headache and neck pain, persistent nausea and vomiting or other new or concerning symptoms return to the Emergency department.

## 2018-07-10 NOTE — Telephone Encounter (Signed)
Noted Pt should GO to the ED

## 2018-07-20 ENCOUNTER — Other Ambulatory Visit: Payer: Self-pay

## 2018-07-20 ENCOUNTER — Telehealth (INDEPENDENT_AMBULATORY_CARE_PROVIDER_SITE_OTHER): Payer: No Typology Code available for payment source | Admitting: Family Medicine

## 2018-07-20 DIAGNOSIS — J0110 Acute frontal sinusitis, unspecified: Secondary | ICD-10-CM

## 2018-07-20 DIAGNOSIS — R062 Wheezing: Secondary | ICD-10-CM

## 2018-07-20 DIAGNOSIS — J3489 Other specified disorders of nose and nasal sinuses: Secondary | ICD-10-CM

## 2018-07-20 MED ORDER — AMOXICILLIN-POT CLAVULANATE 875-125 MG PO TABS: 1.0000 | ORAL_TABLET | Freq: Two times a day (BID) | ORAL | 0 refills | Status: AC

## 2018-07-20 NOTE — Patient Instructions (Addendum)
Try restarting Flonase nasal spray to see if allergies may be contributing to some of the sinus pressure.  Additionally can use saline nasal spray over-the-counter 3-4 times per day for nasal congestion.  If symptoms are not improving in the next day or 2, I did send in the antibiotic Augmentin to be taken twice per day.  Probiotic or yogurt in the diet can help as sometimes that can cause diarrhea.  If your ear symptoms, sinus symptoms, or need for albuterol  are not improving in the next few days, I do recommend repeat exam, and in office evaluation may be best.  Continue to monitor blood pressure and if remains over 140/90, we can also discuss potential changes or in office evaluation.  Return to the clinic or go to the nearest emergency room if any of your symptoms worsen or new symptoms occur.   Sinusitis, Adult Sinusitis is inflammation of your sinuses. Sinuses are hollow spaces in the bones around your face. Your sinuses are located:  Around your eyes.  In the middle of your forehead.  Behind your nose.  In your cheekbones. Mucus normally drains out of your sinuses. When your nasal tissues become inflamed or swollen, mucus can become trapped or blocked. This allows bacteria, viruses, and fungi to grow, which leads to infection. Most infections of the sinuses are caused by a virus. Sinusitis can develop quickly. It can last for up to 4 weeks (acute) or for more than 12 weeks (chronic). Sinusitis often develops after a cold. What are the causes? This condition is caused by anything that creates swelling in the sinuses or stops mucus from draining. This includes:  Allergies.  Asthma.  Infection from bacteria or viruses.  Deformities or blockages in your nose or sinuses.  Abnormal growths in the nose (nasal polyps).  Pollutants, such as chemicals or irritants in the air.  Infection from fungi (rare). What increases the risk? You are more likely to develop this condition if  you:  Have a weak body defense system (immune system).  Do a lot of swimming or diving.  Overuse nasal sprays.  Smoke. What are the signs or symptoms? The main symptoms of this condition are pain and a feeling of pressure around the affected sinuses. Other symptoms include:  Stuffy nose or congestion.  Thick drainage from your nose.  Swelling and warmth over the affected sinuses.  Headache.  Upper toothache.  A cough that may get worse at night.  Extra mucus that collects in the throat or the back of the nose (postnasal drip).  Decreased sense of smell and taste.  Fatigue.  A fever.  Sore throat.  Bad breath. How is this diagnosed? This condition is diagnosed based on:  Your symptoms.  Your medical history.  A physical exam.  Tests to find out if your condition is acute or chronic. This may include: ? Checking your nose for nasal polyps. ? Viewing your sinuses using a device that has a light (endoscope). ? Testing for allergies or bacteria. ? Imaging tests, such as an MRI or CT scan. In rare cases, a bone biopsy may be done to rule out more serious types of fungal sinus disease. How is this treated? Treatment for sinusitis depends on the cause and whether your condition is chronic or acute.  If caused by a virus, your symptoms should go away on their own within 10 days. You may be given medicines to relieve symptoms. They include: ? Medicines that shrink swollen nasal passages (topical  intranasal decongestants). ? Medicines that treat allergies (antihistamines). ? A spray that eases inflammation of the nostrils (topical intranasal corticosteroids). ? Rinses that help get rid of thick mucus in your nose (nasal saline washes).  If caused by bacteria, your health care provider may recommend waiting to see if your symptoms improve. Most bacterial infections will get better without antibiotic medicine. You may be given antibiotics if you have: ? A severe  infection. ? A weak immune system.  If caused by narrow nasal passages or nasal polyps, you may need to have surgery. Follow these instructions at home: Medicines  Take, use, or apply over-the-counter and prescription medicines only as told by your health care provider. These may include nasal sprays.  If you were prescribed an antibiotic medicine, take it as told by your health care provider. Do not stop taking the antibiotic even if you start to feel better. Hydrate and humidify   Drink enough fluid to keep your urine pale yellow. Staying hydrated will help to thin your mucus.  Use a cool mist humidifier to keep the humidity level in your home above 50%.  Inhale steam for 10-15 minutes, 3-4 times a day, or as told by your health care provider. You can do this in the bathroom while a hot shower is running.  Limit your exposure to cool or dry air. Rest  Rest as much as possible.  Sleep with your head raised (elevated).  Make sure you get enough sleep each night. General instructions   Apply a warm, moist washcloth to your face 3-4 times a day or as told by your health care provider. This will help with discomfort.  Wash your hands often with soap and water to reduce your exposure to germs. If soap and water are not available, use hand sanitizer.  Do not smoke. Avoid being around people who are smoking (secondhand smoke).  Keep all follow-up visits as told by your health care provider. This is important. Contact a health care provider if:  You have a fever.  Your symptoms get worse.  Your symptoms do not improve within 10 days. Get help right away if:  You have a severe headache.  You have persistent vomiting.  You have severe pain or swelling around your face or eyes.  You have vision problems.  You develop confusion.  Your neck is stiff.  You have trouble breathing. Summary  Sinusitis is soreness and inflammation of your sinuses. Sinuses are hollow spaces  in the bones around your face.  This condition is caused by nasal tissues that become inflamed or swollen. The swelling traps or blocks the flow of mucus. This allows bacteria, viruses, and fungi to grow, which leads to infection.  If you were prescribed an antibiotic medicine, take it as told by your health care provider. Do not stop taking the antibiotic even if you start to feel better.  Keep all follow-up visits as told by your health care provider. This is important. This information is not intended to replace advice given to you by your health care provider. Make sure you discuss any questions you have with your health care provider. Document Released: 01/28/2005 Document Revised: 06/30/2017 Document Reviewed: 06/30/2017 Elsevier Interactive Patient Education  [employer].    If you have lab work done today you will be contacted with your lab results within the next 2 weeks.  If you have not heard from [place] then please contact us. The fastest way to get your results is to register  for My Chart.   IF you received an x-ray today, you will receive an invoice from [facility]. Please contact [facility] at [phone] with questions or concerns regarding your invoice.   IF you received labwork today, you will receive an invoice from [facility]. Please contact [facility] at [phone] with questions or concerns regarding your invoice.   Our billing staff will not be able to assist you with questions regarding bills from these companies.  You will be contacted with the lab results as soon as they are available. The fastest way to get your results is to activate your My Chart account. Instructions are located on the last page of this paperwork. If you have not heard from [place] regarding the results in 2 weeks, please contact this office.

## 2018-07-20 NOTE — Progress Notes (Signed)
CC- sinus infection- Sinus pressure in center of head,  Ear hurt on right side, head ache, stuffy nose for 4-5 days now. Been taking otc medication which have not been working. No fever. Elevated blood pressure today 140/97 but may be due to the otc medication She been taking.

## 2018-07-20 NOTE — Progress Notes (Signed)
Virtual Visit via Telephone Note  I connected with Belinda Petersen on 07/20/18 at 4:37 PM by telephone and verified that I am speaking with the correct person using two identifiers.   I discussed the limitations, risks, security and privacy concerns of performing an evaluation and management service by telephone and the availability of in person appointments. I also discussed with the patient that there may be a patient responsible charge related to this service. The patient expressed understanding and agreed to proceed, consent obtained  Chief complaint:  Sinus pressure/infection.  History of Present Illness: Belinda Petersen is a 53 y.o. female  Has been having nasal/sinus pressure since about June 3rd. Clear nasal d/c with postnasal drip. Some cough with PND. Initially clear, slight yellow d/c coughed up 3 days ago, now more clear. Less congestion today, but still having HA.  BP up - thinks due to OTC mucinex, zyrtec - no known decongestants. Home reading today. 140/97, 132/103 today. No missed dose Started Wednesday of last week.   R ear now hurting, pressure over past 6 days. normal hearing.  No fever - temp 98.4  Some allergy symptoms possible in the house - some cough, "chronic bronchitis". Past week using about 2 times per day for wheezing - not yet used today.  Has flonase, not used in past   Was diagnosed with Covid 19 5/19.  Mild symptoms - loss sense of taste/smell. No fever. Had some chills, some fatigue. Min dyspnea with cough - O2 100% in ER.  Better overall,until last Wednesday with more sinus sx's.      Patient Active Problem List   Diagnosis Date Noted  . DOE (dyspnea on exertion) 05/04/2014  . Hypertensive urgency 10/14/2013  . Chest pain 10/13/2013  . Fibroids, submucosal 06/08/2012  . Menorrhagia 06/08/2012  . Fibroid uterus 04/29/2012  . 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 Essential hypertension, benign 04/29/2012    Past Medical History:  Diagnosis Date  . Fibroids   . Headache(784.0)   . HTN (hypertension)    no meds  . Iron deficiency anemia secondary to blood loss (chronic)   . Menorrhagia   . SVD (spontaneous vaginal delivery)    x 1   Past Surgical History:  Procedure Laterality Date  . DILITATION & CURRETTAGE/HYSTROSCOPY WITH VERSAPOINT RESECTION N/A 06/08/2012   Procedure: DILATATION & CURETTAGE/HYSTEROSCOPY WITH VERSAPOINT RESECTION;  Surgeon: Lyman Speller, MD;  Location: [facility];  Service: Gynecology;  Laterality: N/A;  . WISDOM TOOTH EXTRACTION     Allergies  Allergen Reactions  . Hctz [Hydrochlorothiazide]     itching  . Amlodipine Besylate   . Irbesartan Hives    Pt reports causes "hives."   Prior to Admission medications   Medication Sig Start Date End Date Taking? Authorizing Provider  albuterol (PROVENTIL HFA;VENTOLIN HFA) 108 (90 Base) MCG/ACT inhaler Inhale 2 puffs into the lungs every 4 (four) hours as needed for wheezing or shortness of breath. 09/04/17  Yes Melynda Ripple, MD  Biotin 1 MG CAPS Take 1 mg by mouth daily.   Yes [provider]  chlorthalidone (HYGROTON) 25 MG tablet TAKE 1 TABLET BY MOUTH EVERY DAY 01/13/18  Yes Lyda Jester M, PA-C  cholecalciferol (VITAMIN D) 1000 units tablet Take 1,000 Units by mouth daily.   Yes [provider]  Cinnamon 500 MG capsule Take 500 mg by mouth daily.   Yes [provider]  Ferrous Sulfate (IRON) 325 (65 Fe) MG TABS Take 325 mg  by mouth daily.   Yes [provider]  fluticasone (FLONASE) 50 MCG/ACT nasal spray Place 2 sprays into both nostrils daily. 09/04/17  Yes Melynda Ripple, MD  Multiple Vitamin (MULTIVITAMIN) tablet Take 1 tablet by mouth daily.   Yes [provider]  Probiotic Product (PROBIOTIC-10 PO) Take 1 tablet by mouth daily.   Yes [provider]  vitamin E 100 UNIT capsule Take by mouth daily.   Yes [provider]   Social  History   Socioeconomic History  . Marital status: Married    Spouse name: Not on file  . Number of children: 1  . Years of education: Not on file  . Highest education level: Not on file  Occupational History    Employer: [employer]  Social Needs  . Financial resource strain: Not on file  . Food insecurity:    Worry: Not on file    Inability: Not on file  . Transportation needs:    Medical: Not on file    Non-medical: Not on file  Tobacco Use  . Smoking status: Never Smoker  . Smokeless tobacco: Never Used  Substance and Sexual Activity  . Alcohol use: Yes    Alcohol/week: 0.0 standard drinks    Comment: 1 glass twice a month  . Drug use: No  . Sexual activity: Yes    Partners: Male    Birth control/protection: None  Lifestyle  . Physical activity:    Days per week: Not on file    Minutes per session: Not on file  . Stress: Not on file  Relationships  . Social connections:    Talks on phone: Not on file    Gets together: Not on file    Attends religious service: Not on file    Active member of club or organization: Not on file    Attends meetings of clubs or organizations: Not on file    Relationship status: Not on file  . Intimate partner violence:    Fear of current or ex partner: Not on file    Emotionally abused: Not on file    Physically abused: Not on file    Forced sexual activity: Not on file  Other Topics Concern  . Not on file  Social History Narrative  . Not on file     Observations/Objective: Self exam/percussion of sinuses: L frontal sore, right frontal and maxillary sinuses were nontender with patient's exam. Appropriate responses, no respiratory distress, understanding expressed of plan with all questions answered.   Temp 98.4 today at home. BP as above.  Assessment and Plan: Sinus pain - Plan: amoxicillin-clavulanate (AUGMENTIN) 875-125 MG tablet  Acute frontal sinusitis, recurrence not specified  Wheezing   Secondary  sinusitis after prior viral infection with coronavirus, with previous interval improvement.  Differential includes allergies.  -Trial of restarting Flonase nasal spray, saline nasal spray.  -Augmentin prescribed with potential side effects and risk discussed, can start in the next day or 2 if not improving with treatment of allergies  -Has albuterol for cough/reactive airway symptoms but RTC precautions given if persistent/frequent use  -ER/RTC precautions   Follow Up Instructions:  Prn  Patient Instructions    Try restarting Flonase nasal spray to see if allergies may be contributing to some of the sinus pressure.  Additionally can use saline nasal spray over-the-counter 3-4 times per day for nasal congestion.  If you have lab work done today you will be contacted with your lab results within the next 2  weeks.  If you have not heard from [place] then please contact us. The fastest way to get your results is to register for My Chart.   IF you received an x-ray today, you will receive an invoice from [facility]. Please contact [facility] at [phone] with questions or concerns regarding your invoice.   IF you received labwork today, you will receive an invoice from [facility]. Please contact [facility] at [phone] with questions or concerns regarding your invoice.   Our billing staff will not be able to assist you with questions regarding bills from these companies.  You will be contacted with the lab results as soon as they are available. The fastest way to get your results is to activate your My Chart account. Instructions are located on the last page of this paperwork. If you have not heard from [place] regarding the results in 2 weeks, please contact this office.         I discussed the assessment and treatment plan with the patient. The patient was provided an opportunity to ask questions and all were answered. The patient agreed with the plan and demonstrated an  understanding of the instructions.   The patient was advised to call back or seek an in-person evaluation if the symptoms worsen or if the condition fails to improve as anticipated.  I provided 20 minutes of non-face-to-face time during this encounter.  Signed,   Merri Ray, MD [facility].  07/20/18

## 2018-07-23 ENCOUNTER — Other Ambulatory Visit: Payer: Self-pay | Admitting: Cardiology

## 2018-08-18 NOTE — Progress Notes (Signed)
This encounter was created in error - please disregard.

## 2018-08-22 ENCOUNTER — Other Ambulatory Visit: Payer: Self-pay | Admitting: Cardiology

## 2018-09-10 ENCOUNTER — Other Ambulatory Visit: Payer: Self-pay | Admitting: Cardiology

## 2018-10-12 ENCOUNTER — Telehealth: Payer: Self-pay | Admitting: Family Medicine

## 2018-10-12 NOTE — Telephone Encounter (Signed)
Patient requesting chlorthalidone (HYGROTON) 25 MG tablet, patient informed please allow 48 to 72 hour turn around time, please advise  [id number] [address], [place] (Phone) [phone] (Fax)

## 2018-10-14 ENCOUNTER — Other Ambulatory Visit: Payer: Self-pay | Admitting: Cardiology

## 2018-10-21 ENCOUNTER — Other Ambulatory Visit: Payer: Self-pay | Admitting: Cardiology

## 2018-10-27 ENCOUNTER — Other Ambulatory Visit: Payer: Self-pay | Admitting: Cardiology

## 2018-10-27 MED ORDER — CHLORTHALIDONE 25 MG PO TABS: 25.0000 mg | ORAL_TABLET | Freq: Every day | ORAL | 0 refills | Status: DC

## 2018-10-27 NOTE — Telephone Encounter (Signed)
°*  STAT* If patient is at the pharmacy, call can be transferred to refill team.   1. Which medications need to be refilled? (please list name of each medication and dose if known) chlorthalidone (HYGROTON) 25 MG tablet  2. Which pharmacy/location (including street and city if local pharmacy) is medication to be sent to? [id number] - [place], [place] - [address]  3. Do they need a 30 day or 90 day supply? 90 day  Patient only has 1 day left

## 2018-10-27 NOTE — Telephone Encounter (Signed)
You can refill

## 2018-10-27 NOTE — Telephone Encounter (Signed)
Pt calling requesting a refill on chlorthalidone. Pt has not been seen since 2018. Pt has an upcoming appt with Estella Husk, PA on 11/11/18. Would Dr. Meda Coffee like to refill this medication? Please address

## 2018-10-27 NOTE — Telephone Encounter (Signed)
Pt's medication was sent to pt's pharmacy as requested. Confirmation received.  °

## 2018-11-10 DIAGNOSIS — Z87898 Personal history of other specified conditions: Secondary | ICD-10-CM | POA: Insufficient documentation

## 2018-11-10 DIAGNOSIS — E785 Hyperlipidemia, unspecified: Secondary | ICD-10-CM | POA: Insufficient documentation

## 2018-11-10 NOTE — Progress Notes (Signed)
Cardiology Office Note    Date:  11/11/2018   ID:  Belinda Petersen, DOB 04/29/1965, MRN [id number]  PCP:  Forrest Moron, MD  Cardiologist: Ena Dawley, MD EPS: None  No chief complaint on file.   History of Present Illness:  Belinda Petersen is a 53 y.o. 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 Chronic anemia due to menorrhagia.  LOV 11/2016 doing well.  Patient comes in for f/u. Walks 3-5 miles 5 days/week. Has migraines from menstrual Cycle. Pescatarian. Denies chest pain, palpitations, dyspnea, dizziness or presyncope.LDL 131 01/2018.    Past Medical History:  Diagnosis Date  . Fibroids   . Headache(784.0)   . HTN (hypertension)    no meds  . Iron deficiency anemia secondary to blood loss (chronic)   . Menorrhagia   . SVD (spontaneous vaginal delivery)    x 1    Past Surgical History:  Procedure Laterality Date  . DILITATION & CURRETTAGE/HYSTROSCOPY WITH VERSAPOINT RESECTION N/A 06/08/2012   Procedure: DILATATION & CURETTAGE/HYSTEROSCOPY WITH VERSAPOINT RESECTION;  Surgeon: Lyman Speller, MD;  Location: [facility];  Service: Gynecology;  Laterality: N/A;  . WISDOM TOOTH EXTRACTION      Current Medications: Current Meds  Medication Sig  . albuterol (PROVENTIL HFA;VENTOLIN HFA) 108 (90 Base) MCG/ACT inhaler Inhale 2 puffs into the lungs every 4 (four) hours as needed for wheezing or shortness of breath.  . Biotin 1 MG CAPS Take 1 mg by mouth daily.  . chlorthalidone (HYGROTON) 25 MG tablet Take 1 tablet (25 mg total) by mouth daily.  . cholecalciferol (VITAMIN D) 1000 units tablet Take 1,000 Units by mouth daily.  . Cinnamon 500 MG capsule Take 500 mg by mouth daily.  . Ferrous Sulfate (IRON) 325 (65 Fe) MG TABS Take 325 mg by mouth daily.  . fluticasone (FLONASE) 50 MCG/ACT  nasal spray Place 2 sprays into both nostrils daily.  . Multiple Vitamin (MULTIVITAMIN) tablet Take 1 tablet by mouth daily.  . Probiotic Product (PROBIOTIC-10 PO) Take 1 tablet by mouth daily.  . vitamin E 100 UNIT capsule Take by mouth daily.  . [DISCONTINUED] chlorthalidone (HYGROTON) 25 MG tablet Take 1 tablet (25 mg total) by mouth daily. Please keep upcoming appt in September before anymore refills. Thank you     Allergies:   Hctz [hydrochlorothiazide], Amlodipine besylate, and Irbesartan   Social History   Socioeconomic History  . Marital status: Married    Spouse name: Not on file  . Number of children: 1  . Years of education: Not on file  . Highest education level: Not on file  Occupational History    Employer: [employer]  Social Needs  . Financial resource strain: Not on file  . Food insecurity    Worry: Not on file    Inability: Not on file  . Transportation needs    Medical: Not on file    Non-medical: Not on file  Tobacco Use  . Smoking status: Never Smoker  . Smokeless tobacco: Never Used  Substance and Sexual Activity  . Alcohol use: Yes    Alcohol/week: 0.0 standard drinks    Comment: 1 glass twice a month  . Drug use: No  . Sexual activity: Yes    Partners: Male    Birth control/protection: None  Lifestyle  .  Physical activity    Days per week: Not on file    Minutes per session: Not on file  . Stress: Not on file  Relationships  . Social Herbalist on phone: Not on file    Gets together: Not on file    Attends religious service: Not on file    Active member of club or organization: Not on file    Attends meetings of clubs or organizations: Not on file    Relationship status: Not on file  Other Topics Concern  . Not on file  Social History Narrative  . Not on file     Family History:  The patient's family history includes Breast cancer in her paternal grandmother; Congestive Heart Failure in her mother; Diabetes in  her father; Heart attack in her brother; Heart disease in her brother and maternal grandmother; Hypertension in her brother, brother, brother, father, maternal grandmother, mother, and sister; Lung cancer in her father and paternal grandfather.   ROS:   Please see the history of present illness.    ROS All other systems reviewed and are negative.   PHYSICAL EXAM:   VS:  BP 118/88   Pulse 100   Ht 5\' 3"  (1.6 m)   Wt 193 lb 12.8 oz (87.9 kg)   SpO2 97%   BMI 34.33 kg/m   Physical Exam  GEN: Obese, in no acute distress  Neck: no JVD, carotid bruits, or masses Cardiac:RRR; no murmurs, rubs, or gallops  Respiratory:  clear to auscultation bilaterally, normal work of breathing GI: soft, nontender, nondistended, + BS Ext: without cyanosis, clubbing, or edema, Good distal pulses bilaterally Neuro:  Alert and Oriented x 3 Psych: euthymic mood, full affect  Wt Readings from Last 3 Encounters:  11/11/18 193 lb 12.8 oz (87.9 kg)  07/09/18 189 lb (85.7 kg)  07/08/18 189 lb (85.7 kg)      Studies/Labs Reviewed:   EKG:  EKG is not ordered today.   Recent Labs: 01/21/2018: Hemoglobin 10.2; Platelets 306   Lipid Panel    Component Value Date/Time   CHOL 215 (H) 01/21/2018 0953   TRIG 54 01/21/2018 0953   HDL 73 01/21/2018 0953   CHOLHDL 2.9 01/21/2018 0953   LDLCALC 131 (H) 01/21/2018 0953    Additional studies/ records that were reviewed today include:  Echo 2018Study Conclusions   - Left ventricle: The cavity size was normal. Wall thickness was   normal. Systolic function was normal. The estimated ejection   fraction was in the range of 55% to 60%. Wall motion was normal;   there were no regional wall motion abnormalities. Left   ventricular diastolic function parameters were normal.   Impressions:   - Normal LV systolic and diastolic function; no significant   valvular disease.    NST 2015IMPRESSION: 1. No reversible ischemia or infarction.   2. Normal left  ventricular wall motion.   3. Left ventricular ejection fraction 61%   4. Low-risk stress test findings with a fixed defect in the apical anterolateral wall consistent with breast attenuation artifact. There was no ischemia. There was abnormal uptake of radiotracer in both forearms. Clniical correlation recommended.   *2012 Appropriate Use Criteria for Coronary Revascularization Focused Update: J Am Coll Cardiol. B5713794. http://content.airportbarriers.com.aspx?articleid=1201161     Electronically Signed   By: Fransico Him   On: 10/14/2013 15:37      ASSESSMENT:    1. Essential hypertension, benign   2. Hyperlipidemia, unspecified hyperlipidemia type  3. History of syncope      PLAN:  In order of problems listed above:  HTN BP well controlled-continue chlorthalidone  HLD LDL 131 01/2018-diet and weight loss recommended. To have repeat in Dec by PCP. Goal < 90  History of syncope in setting of emesis felt to be vasovagal- no recurrence.    Medication Adjustments/Labs and Tests Ordered: Current medicines are reviewed at length with the patient today.  Concerns regarding medicines are outlined above.  Medication changes, Labs and Tests ordered today are listed in the Patient Instructions below. Patient Instructions  Medication Instructions:  Your physician recommends that you continue on your current medications as directed. Please refer to the Current Medication list given to you today.  If you need a refill on your cardiac medications before your next appointment, please call your pharmacy.   Lab work: None Ordered  If you have labs (blood work) drawn today and your tests are completely normal, you will receive your results only by: Marland Kitchen MyChart Message (if you have MyChart) OR . A paper copy in the mail If you have any lab test that is abnormal or we need to change your treatment, we will call you to review the results.  Testing/Procedures: None  ordered  Follow-Up: At [facility], you and your health needs are our priority.  As part of our continuing mission to provide you with exceptional heart care, we have created designated Provider Care Teams.  These Care Teams include your primary Cardiologist (physician) and Advanced Practice Providers (APPs -  Physician Assistants and Nurse Practitioners) who all work together to provide you with the care you need, when you need it. . You will need a follow up appointment in 1 year.  Please call our office 2 months in advance to schedule this appointment.  You may see Ena Dawley, MD or one of the following Advanced Practice Providers on your designated Care Team:   . Lyda Jester, PA-C . Dayna Dunn, PA-C . Ermalinda Barrios, PA-C  Any Other Special Instructions Will Be Listed Below (If Applicable).       Signed, Ermalinda Barrios, PA-C  11/11/2018 1:08 PM    [facility] [address], [place], [place]  [address] Phone: [phone]; Fax: [phone]

## 2018-11-11 ENCOUNTER — Other Ambulatory Visit: Payer: Self-pay

## 2018-11-11 ENCOUNTER — Ambulatory Visit (INDEPENDENT_AMBULATORY_CARE_PROVIDER_SITE_OTHER): Payer: No Typology Code available for payment source | Admitting: Physician Assistant

## 2018-11-11 ENCOUNTER — Encounter: Payer: Self-pay | Admitting: Physician Assistant

## 2018-11-11 VITALS — BP 118/88 | HR 100 | Ht 63.0 in | Wt 193.8 lb

## 2018-11-11 DIAGNOSIS — E785 Hyperlipidemia, unspecified: Secondary | ICD-10-CM

## 2018-11-11 DIAGNOSIS — I10 Essential (primary) hypertension: Secondary | ICD-10-CM

## 2018-11-11 DIAGNOSIS — Z87898 Personal history of other specified conditions: Secondary | ICD-10-CM | POA: Diagnosis not present

## 2018-11-11 MED ORDER — CHLORTHALIDONE 25 MG PO TABS: 25.0000 mg | ORAL_TABLET | Freq: Every day | ORAL | 3 refills | Status: DC

## 2018-11-11 NOTE — Patient Instructions (Signed)
Medication Instructions:  Your physician recommends that you continue on your current medications as directed. Please refer to the Current Medication list given to you today.  If you need a refill on your cardiac medications before your next appointment, please call your pharmacy.   Lab work: None Ordered  If you have labs (blood work) drawn today and your tests are completely normal, you will receive your results only by: . MyChart Message (if you have MyChart) OR . A paper copy in the mail If you have any lab test that is abnormal or we need to change your treatment, we will call you to review the results.  Testing/Procedures: None ordered  Follow-Up: At [facility], you and your health needs are our priority.  As part of our continuing mission to provide you with exceptional heart care, we have created designated Provider Care Teams.  These Care Teams include your primary Cardiologist (physician) and Advanced Practice Providers (APPs -  Physician Assistants and Nurse Practitioners) who all work together to provide you with the care you need, when you need it. . You will need a follow up appointment in 1 year.  Please call our office 2 months in advance to schedule this appointment.  You may see Katarina Nelson, MD or one of the following Advanced Practice Providers on your designated Care Team:   . Brittainy Simmons, PA-C . Dayna Dunn, PA-C . Michele Lenze, PA-C  Any Other Special Instructions Will Be Listed Below (If Applicable).    

## 2018-11-18 ENCOUNTER — Other Ambulatory Visit: Payer: Self-pay

## 2018-11-18 ENCOUNTER — Ambulatory Visit (INDEPENDENT_AMBULATORY_CARE_PROVIDER_SITE_OTHER): Payer: No Typology Code available for payment source | Admitting: Registered Nurse

## 2018-11-18 DIAGNOSIS — Z23 Encounter for immunization: Secondary | ICD-10-CM

## 2018-11-18 NOTE — Patient Instructions (Signed)
Injection given in the right deltoid, she tolerated well.

## 2018-12-16 ENCOUNTER — Ambulatory Visit (HOSPITAL_COMMUNITY)
Admission: EM | Admit: 2018-12-16 | Discharge: 2018-12-16 | Disposition: A | Discharge status: Home / Self Care | Payer: No Typology Code available for payment source | Attending: Family Medicine | Admitting: Family Medicine

## 2018-12-16 ENCOUNTER — Encounter (HOSPITAL_COMMUNITY): Payer: Self-pay

## 2018-12-16 ENCOUNTER — Other Ambulatory Visit: Payer: Self-pay

## 2018-12-16 DIAGNOSIS — M79604 Pain in right leg: Secondary | ICD-10-CM

## 2018-12-16 DIAGNOSIS — S39012A Strain of muscle, fascia and tendon of lower back, initial encounter: Secondary | ICD-10-CM

## 2018-12-16 DIAGNOSIS — I10 Essential (primary) hypertension: Secondary | ICD-10-CM

## 2018-12-16 MED ORDER — DICLOFENAC SODIUM 75 MG PO TBEC: 75.0000 mg | DELAYED_RELEASE_TABLET | Freq: Two times a day (BID) | ORAL | 0 refills | Status: DC

## 2018-12-16 MED ORDER — CYCLOBENZAPRINE HCL 10 MG PO TABS: ORAL_TABLET | ORAL | 0 refills | Status: DC
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
## 2018-12-16 NOTE — Discharge Instructions (Addendum)
HOME CARE INSTRUCTIONS: For many people, back pain returns. Since low back pain is rarely dangerous, it is often a condition that people can learn to manage on their own. Please remain active. It is stressful on the back to sit or stand in one place. Do not sit, drive, or stand in one place for more than 30 minutes at a time. Take short walks on level surfaces as soon as pain allows. Try to increase the length of time you walk each day. Do not stay in bed. Resting more than 1 or 2 days can delay your recovery. Do not avoid exercise or work. Your body is made to move. It is not dangerous to be active, even though your back may hurt. Your back will likely heal faster if you return to being active before your pain is gone. Over-the-counter medicines to reduce pain and inflammation are often the most helpful.  SEEK MEDICAL CARE IF: You have pain that is not relieved with rest or medicine. You have pain that does not improve in 1 week. You have new symptoms. You are generally not feeling well.  SEEK IMMEDIATE MEDICAL CARE IF: You have pain that radiates from your back into your legs. You develop new bowel or bladder control problems. You have unusual weakness or numbness in your arms or legs. You develop nausea or vomiting. You develop abdominal pain. You feel faint.  Your blood pressure was noted to be elevated during your visit today. You may return here within the next few days to recheck if unable to see your primary care doctor. If your blood pressure remains persistently elevated, you may need to adjust your medications.  BP (!) 182/111 (BP Location: Right Arm)    Pulse 90    Temp 98.3 F (36.8 C) (Oral)    Resp 16    SpO2 100%
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
## 2018-12-30 ENCOUNTER — Encounter: Payer: Self-pay | Admitting: Family Medicine

## 2018-12-30 ENCOUNTER — Other Ambulatory Visit: Payer: Self-pay

## 2018-12-30 ENCOUNTER — Ambulatory Visit: Payer: No Typology Code available for payment source | Admitting: Family Medicine

## 2018-12-30 DIAGNOSIS — M62838 Other muscle spasm: Secondary | ICD-10-CM | POA: Diagnosis not present

## 2018-12-30 DIAGNOSIS — M5431 Sciatica, right side: Secondary | ICD-10-CM | POA: Diagnosis not present

## 2018-12-30 DIAGNOSIS — S39012D Strain of muscle, fascia and tendon of lower back, subsequent encounter: Secondary | ICD-10-CM

## 2018-12-30 DIAGNOSIS — M6289 Other specified disorders of muscle: Secondary | ICD-10-CM

## 2018-12-30 DIAGNOSIS — I10 Essential (primary) hypertension: Secondary | ICD-10-CM
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
## 2018-12-30 NOTE — Patient Instructions (Addendum)
If you have lab work done today you will be contacted with your lab results within the next 2 weeks.  If you have not heard from [place] then please contact us. The fastest way to get your results is to register for My Chart.   IF you received an x-ray today, you will receive an invoice from [facility]. Please contact [facility] at [phone] with questions or concerns regarding your invoice.   IF you received labwork today, you will receive an invoice from [facility]. Please contact [facility] at [phone] with questions or concerns regarding your invoice.   Our billing staff will not be able to assist you with questions regarding bills from these companies.  You will be contacted with the lab results as soon as they are available. The fastest way to get your results is to activate your My Chart account. Instructions are located on the last page of this paperwork. If you have not heard from [place] regarding the results in 2 weeks, please contact this office.      Muscle Strain A muscle strain is an injury that occurs when a muscle is stretched beyond its normal length. Usually, a small number of muscle fibers are torn when this happens. There are three types of muscle strains. First-degree strains have the least amount of muscle fiber tearing and the least amount of pain. Second-degree and third-degree strains have more tearing and pain. Usually, recovery from muscle strain takes 1-2 weeks. Complete healing normally takes 5-6 weeks. What are the causes? This condition is caused when a sudden, violent force is placed on a muscle and stretches it too far. This may occur with a fall, lifting, or sports. What increases the risk? This condition is more likely to develop in athletes and people who are physically active. What are the signs or symptoms? Symptoms of this condition include:  Pain.  Bruising.  Swelling.  Trouble using the muscle. How is this diagnosed? This  condition is diagnosed based on a physical exam and your medical history. Tests may also be done, including an X-ray, ultrasound, or MRI. How is this treated? This condition is initially treated with PRICE therapy. This therapy involves:  Protecting the muscle from being injured again.  Resting the injured muscle.  Icing the injured muscle.  Applying pressure (compression) to the injured muscle. This may be done with a splint or elastic bandage.  Raising (elevating) the injured muscle. Your health care provider may also recommend medicine for pain. Follow these instructions at home: If you have a splint:  Wear the splint as told by your health care provider. Remove it only as told by your health care provider.  Loosen the splint if your fingers or toes tingle, become numb, or turn cold and blue.  Keep the splint clean.  If the splint is not waterproof: ? Do not let it get wet. ? Cover it with a watertight covering when you take a bath or a shower. Managing pain, stiffness, and swelling   If directed, put ice on the injured area. ? If you have a removable splint, remove it as told by your health care provider. ? Put ice in a plastic bag. ? Place a towel between your skin and the bag. ? Leave the ice on for 20 minutes, 2-3 times a day.  Move your fingers or toes often to avoid stiffness and to lessen swelling.  Raise (elevate) the injured area above the level of your heart while you are  sitting or lying down.  Wear an elastic bandage as told by your health care provider. Make sure that it is not too tight. General instructions  Take over-the-counter and prescription medicines only as told by your health care provider.  Restrict your activity and rest the injured muscle as told by your health care provider. Gentle movements may be allowed.  If physical therapy was prescribed, do exercises as told by your health care provider.  Do not put pressure on any part of the splint  until it is fully hardened. This may take several hours.  Do not use any products that contain nicotine or tobacco, such as cigarettes and e-cigarettes. These can delay bone healing. If you need help quitting, ask your health care provider.  Ask your health care provider when it is safe to drive if you have a splint.  Keep all follow-up visits as told by your health care provider. This is important. How is this prevented?  Warm up before exercising. This helps to prevent future muscle strains. Contact a health care provider if:  You have more pain or swelling in the injured area. Get help right away if:  You have numbness or tingling or lose a lot of strength in the injured area. Summary  A muscle strain is an injury that occurs when a muscle is stretched beyond its normal length.  This condition is caused when a sudden, violent force is placed on a muscle and stretches it too far.  This condition is initially treated with PRICE therapy, which involves protecting, resting, icing, compressing, and elevating.  Gentle movements may be allowed. If physical therapy was prescribed, do exercises as told by your health care provider. This information is not intended to replace advice given to you by your health care provider. Make sure you discuss any questions you have with your health care provider. Document Released: 01/28/2005 Document Revised: 01/10/2017 Document Reviewed: 03/06/2016 Elsevier Patient Education  2020 [employer].

## 2019-01-06 ENCOUNTER — Encounter: Payer: Self-pay | Admitting: Physical Therapy

## 2019-01-06 ENCOUNTER — Other Ambulatory Visit: Payer: Self-pay

## 2019-01-06 ENCOUNTER — Ambulatory Visit: Payer: No Typology Code available for payment source | Attending: Family Medicine | Admitting: Physical Therapy

## 2019-01-06 DIAGNOSIS — M5412 Radiculopathy, cervical region: Secondary | ICD-10-CM | POA: Diagnosis present

## 2019-01-06 DIAGNOSIS — M62838 Other muscle spasm: Secondary | ICD-10-CM | POA: Diagnosis present

## 2019-01-06 DIAGNOSIS — M5416 Radiculopathy, lumbar region: Secondary | ICD-10-CM | POA: Diagnosis present
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
## 2019-01-14 ENCOUNTER — Encounter: Payer: Self-pay | Admitting: Physical Therapy

## 2019-01-14 ENCOUNTER — Other Ambulatory Visit: Payer: Self-pay

## 2019-01-14 ENCOUNTER — Ambulatory Visit: Payer: No Typology Code available for payment source | Attending: Family Medicine | Admitting: Physical Therapy

## 2019-01-14 DIAGNOSIS — M5416 Radiculopathy, lumbar region: Secondary | ICD-10-CM | POA: Insufficient documentation

## 2019-01-14 DIAGNOSIS — M62838 Other muscle spasm: Secondary | ICD-10-CM | POA: Diagnosis present

## 2019-01-14 DIAGNOSIS — M5412 Radiculopathy, cervical region: Secondary | ICD-10-CM | POA: Diagnosis not present

## 2019-01-14 NOTE — Therapy (Signed)
[place], [place], [address] Phone: [phone]   Fax:  [phone]  Physical Therapy Treatment  Patient Details  Name: Belinda Petersen MRN: [id number] Date of Birth: September 07, 1965 Referring Provider (PT): Dr Delia Chimes    Encounter Date: 01/14/2019  PT End of Session - 01/14/19 1109    Visit Number  2    Number of Visits  12    Date for PT Re-Evaluation  02/17/19    Authorization Type  [employer] Golden rule    PT Start Time  1022    PT Stop Time  1103    PT Time Calculation (min)  41 min    Activity Tolerance  Patient tolerated treatment well    Behavior During Therapy  [facility] for tasks assessed/performed       Past Medical History:  Diagnosis Date  . Fibroids   . Headache(784.0)   . HTN (hypertension)    no meds  . Iron deficiency anemia secondary to blood loss (chronic)   . Menorrhagia   . SVD (spontaneous vaginal delivery)    x 1    Past Surgical History:  Procedure Laterality Date  . DILITATION & CURRETTAGE/HYSTROSCOPY WITH VERSAPOINT RESECTION N/A 06/08/2012   Procedure: DILATATION & CURETTAGE/HYSTEROSCOPY WITH VERSAPOINT RESECTION;  Surgeon: Lyman Speller, MD;  Location: [facility];  Service: Gynecology;  Laterality: N/A;  . WISDOM TOOTH EXTRACTION      There were no vitals filed for this visit.  Subjective Assessment - 01/14/19 1027    Subjective  Mild improvement from status at eval but continues with previous symptoms with LBP with RLE radicular symptoms as well as cervical pain with right UE radicular symptoms and left upper trapezius region pain.    Currently in Pain?  Yes    Pain Score  7     Pain Location  Back    Pain Orientation  Right    Pain Descriptors / Indicators  Aching    Pain Type  Chronic pain    Pain Radiating Towards  RLE distally to calf    Pain Onset  More than a month ago    Pain Frequency  Intermittent    Pain Score  7    Pain Location  Neck    Pain  Orientation  Right;Left    Pain Descriptors / Indicators  Aching    Pain Type  Acute pain    Pain Radiating Towards  right UE into elbow    Pain Onset  More than a month ago    Pain Frequency  Constant    Aggravating Factors   positioning at night    Pain Relieving Factors  rest    Effect of Pain on Daily Activities  difficulty performing ADLs                       [facility] Adult PT Treatment/Exercise - 01/14/19 0001      Exercises   Exercises  Neck      Neck Exercises: Supine   Neck Retraction  15 reps      Lumbar Exercises: Stretches   Prone on Elbows Stretch Limitations  POE x 2 minutes    Press Ups Limitations  felt good on back/leg but increased neck and arm soreness-showed extension in standing as alternative to decrease UE pressure vs. prone over pillows for passive extension stretch    Other Lumbar Stretch Exercise  extension in standing 2x10  Manual Therapy   Manual Therapy  Joint mobilization    Joint Mobilization  LAD to bilat. hips grade I-III oscillations focus right side (tried left also but increased LBP)    Manual Traction  gentle cervical manual traction and suboccipital release      Neck Exercises: Stretches   Upper Trapezius Stretch  Right;Left;2 reps;30 seconds       Trigger Point Dry Needling - 01/14/19 0001    Consent Given?  Yes    Education Handout Provided  Previously provided    Muscles Treated Head and Neck  Upper trapezius;Cervical multifidi    Muscles Treated Back/Hip  Erector spinae;Lumbar multifidi    Dry Needling Comments  needling for left upper trapezius and right cervical multifidi at C6-7 levels, bilat. lumbar multifidi L4-S1 and right lumbar longissimus L4-5 region-needling in prone, 30 mm 32 gauge needles used for upper trapezius otherwise 50 mm 32 gaue needles used    Electrical Stimulation Performed with Dry Needling  Yes    E-stim with Dry Needling Details  TENS 2 pps x 10 minutes           PT Education -  01/14/19 1109    Education Details  dry needling, HEP, POC    Person(s) Educated  Patient    Methods  Explanation;Demonstration;Verbal cues    Comprehension  Verbalized understanding;Returned demonstration       PT Short Term Goals - 01/06/19 1528      PT SHORT TERM GOAL #1   Title  Patient will increase right cervical rotation by 20 degrees    Time  3    Period  Weeks    Status  New    Target Date  01/27/19      PT SHORT TERM GOAL #2   Title  Patient will demonstrate full left actvie shoulder flexion    Baseline  Patient will increase bilateral passive hip flrexion to 110 degrees.    Time  3    Period  Weeks    Status  New    Target Date  01/27/19        PT Long Term Goals - 01/06/19 1532      PT LONG TERM GOAL #1   Title  Patient will sit at her desk for 1 hour without increased pain    Time  6    Period  Weeks    Status  New    Target Date  02/17/19      PT LONG TERM GOAL #2   Title  Patient will walk around the grocery store without increased pain    Time  6    Period  Weeks    Status  New    Target Date  02/17/19      PT LONG TERM GOAL #3   Title  Patient will stand for 1 hour without increased pain in order to perfrom IADl's    Time  6    Period  Weeks    Status  New    Target Date  02/17/19            Plan - 01/14/19 1111    Clinical Impression Statement  For lumbar region trial prone positioning with POE-benefit noted in terms of back/feeling "stretch" but difficulty assessing centralization response for RLE radicular symptoms and also pt. noted soreness in neck and arms-worked on extension in standing as alternative. For neck continued traction and included trial dry needling for both cervical and lumbar regions as noted  per flowsheet with good tolerance. Will wait further tx. response from addition extension bias lumbar ROM as well as response dry needling and continue/progress as appropriate.    Personal Factors and Comorbidities  Comorbidity 1     Comorbidities  headaches    Examination-Activity Limitations  Stand;Lift;Reach Overhead;Sit;Dressing    Examination-Participation Restrictions  Cleaning;Community Activity;Personal Finances    Stability/Clinical Decision Making  Evolving/Moderate complexity    Clinical Decision Making  Moderate    Rehab Potential  Good    PT Frequency  2x / week    PT Duration  6 weeks    PT Treatment/Interventions  ADLs/Self Care Home Management;Cryotherapy;Electrical Stimulation;Ultrasound;Functional mobility training;Therapeutic activities;Therapeutic exercise;Patient/family education;Neuromuscular re-education;Manual techniques;Passive range of motion;Splinting;Taping    PT Next Visit Plan  continue extension bias trunk ROM, manual with traction and hip LAD, STM, gentle stretches, further dry needling and modalities as found beneficial    PT Home Exercise Plan  piriformis stretch; reviewed abdominal breathing, upper trap stretch; levator stretch, prone on elbows vs. extension in standing    Consulted and Agree with Plan of Care  Patient       Patient will benefit from skilled therapeutic intervention in order to improve the following deficits and impairments:  Impaired UE functional use, Decreased activity tolerance, Decreased strength, Decreased safety awareness, Decreased range of motion, Pain, Decreased mobility, Decreased endurance, Increased muscle spasms  Visit Diagnosis: Radiculopathy, cervical region  Radiculopathy, lumbar region  Other muscle spasm     Problem List Patient Active Problem List   Diagnosis Date Noted  . Hyperlipidemia 11/10/2018  . History of syncope 11/10/2018  . DOE (dyspnea on exertion) 05/04/2014  . Hypertensive urgency 10/14/2013  . Chest pain 10/13/2013  . Fibroids, submucosal 06/08/2012  . Menorrhagia 06/08/2012  . Fibroid uterus 04/29/2012  . 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 Essential hypertension, benign 04/29/2012     Beaulah Dinning, PT, DPT 01/14/19 11:51 AM  [facility] [address] [place], [place], [address] Phone: [phone]   Fax:  [phone]  Name: Cyara Baisch MRN: [id number] Date of Birth: November 17, 1965

## 2019-01-19 ENCOUNTER — Ambulatory Visit (HOSPITAL_COMMUNITY)
Admission: EM | Admit: 2019-01-19 | Discharge: 2019-01-19 | Disposition: A | Discharge status: Home / Self Care | Payer: No Typology Code available for payment source | Attending: Emergency Medicine | Admitting: Emergency Medicine

## 2019-01-19 ENCOUNTER — Ambulatory Visit
Admission: EM | Admit: 2019-01-19 | Discharge: 2019-01-19 | Discharge status: Absent without leave | Payer: No Typology Code available for payment source

## 2019-01-19 ENCOUNTER — Other Ambulatory Visit: Payer: Self-pay

## 2019-01-19 ENCOUNTER — Encounter (HOSPITAL_COMMUNITY): Payer: Self-pay

## 2019-01-19 DIAGNOSIS — Z8619 Personal history of other infectious and parasitic diseases: Secondary | ICD-10-CM | POA: Diagnosis not present

## 2019-01-19 DIAGNOSIS — J988 Other specified respiratory disorders: Secondary | ICD-10-CM

## 2019-01-19 DIAGNOSIS — Z20828 Contact with and (suspected) exposure to other viral communicable diseases: Secondary | ICD-10-CM | POA: Insufficient documentation

## 2019-01-19 DIAGNOSIS — B9789 Other viral agents as the cause of diseases classified elsewhere: Secondary | ICD-10-CM

## 2019-01-19 DIAGNOSIS — Z20822 Contact with and (suspected) exposure to covid-19: Secondary | ICD-10-CM

## 2019-01-19 MED ORDER — ONDANSETRON 8 MG PO TBDP: ORAL_TABLET | ORAL | 0 refills | Status: DC

## 2019-01-19 NOTE — ED Triage Notes (Signed)
Pt presents to the UC with cough, nausea and sore throat. Pt states she was exposed to a positive case of Covid 4 days ago.

## 2019-01-19 NOTE — Discharge Instructions (Addendum)
Zofran as needed for nausea.  It may make you constipated.  Continue hot tea, vitamins, elderberry.

## 2019-01-19 NOTE — ED Provider Notes (Addendum)
HPI  SUBJECTIVE:  Belinda Petersen is a 53 y.o. female who presents with sore throat, nonproductive cough, nausea starting several days ago.  States that she was exposed to Covid 4 days ago by her hairdresser.  Patient states that they were both wearing masks, but she was notified today that her hairdresser tested positive for Covid.  She denies body aches, headaches, nasal congestion, fatigue, loss of sense of smell or taste, shortness of breath, chest pain, wheezing.  No Vomiting, diarrhea, abdominal pain.  No GERD symptoms.  Questionable allergy somymptoms.  Mild sneezing, no itchy, watery eyes.  No voice changes, neck stiffness, drooling, trismus, sensation of throat swelling shut.  She has tried hot tea, elderberry and vitamins.  Hot tea helps.  No aggravating factors.  She has a past medical history of hypertension, hypercholesterolemia, Covid back in May.  States this does not feel like Covid.  No history of coronary disease, diabetes, chronic kidney disease, HIV, cancer, immunocompromise, pulmonary disease, smoking.  [id number], Arlie Solomons, MD     Past Medical History:  Diagnosis Date  . Fibroids   . Headache(784.0)   . HTN (hypertension)    no meds  . Iron deficiency anemia secondary to blood loss (chronic)   . Menorrhagia   . SVD (spontaneous vaginal delivery)    x 1    Past Surgical History:  Procedure Laterality Date  . DILITATION & CURRETTAGE/HYSTROSCOPY WITH VERSAPOINT RESECTION N/A 06/08/2012   Procedure: DILATATION & CURETTAGE/HYSTEROSCOPY WITH VERSAPOINT RESECTION;  Surgeon: Lyman Speller, MD;  Location: [facility];  Service: Gynecology;  Laterality: N/A;  . WISDOM TOOTH EXTRACTION      Family History  Problem Relation Age of Onset  . Diabetes Father   . Hypertension Father   . Lung cancer Father   . Hypertension Mother   . Congestive Heart Failure Mother   . Hypertension Brother   . Hypertension Brother   . Hypertension Brother   . Hypertension Sister   .  Heart disease Brother   . Heart attack Brother   . Breast cancer Paternal Grandmother   . Heart disease Maternal Grandmother        pacemaker  . Hypertension Maternal Grandmother   . Lung cancer Paternal Grandfather     Social History   Tobacco Use  . Smoking status: Never Smoker  . Smokeless tobacco: Never Used  Substance Use Topics  . Alcohol use: Yes    Alcohol/week: 0.0 standard drinks    Comment: 1 glass twice a month  . Drug use: No    No current facility-administered medications for this encounter.   Current Outpatient Medications:  .  albuterol (PROVENTIL HFA;VENTOLIN HFA) 108 (90 Base) MCG/ACT inhaler, Inhale 2 puffs into the lungs every 4 (four) hours as needed for wheezing or shortness of breath., Disp: 1 Inhaler, Rfl: 0 .  Biotin 1 MG CAPS, Take 1 mg by mouth daily., Disp: , Rfl:  .  chlorthalidone (HYGROTON) 25 MG tablet, Take 1 tablet (25 mg total) by mouth daily., Disp: 90 tablet, Rfl: 3 .  cholecalciferol (VITAMIN D) 1000 units tablet, Take 1,000 Units by mouth daily., Disp: , Rfl:  .  Cinnamon 500 MG capsule, Take 500 mg by mouth daily., Disp: , Rfl:  .  cyclobenzaprine (FLEXERIL) 10 MG tablet, Take 1 tablet by mouth 3 times daily as needed for muscle spasm. Warning: May cause drowsiness., Disp: 21 tablet, Rfl: 0 .  diclofenac (VOLTAREN) 75 MG EC tablet, Take 1 tablet (75 mg total)  by mouth 2 (two) times daily., Disp: 14 tablet, Rfl: 0 .  Ferrous Sulfate (IRON) 325 (65 Fe) MG TABS, Take 325 mg by mouth daily., Disp: , Rfl:  .  fluticasone (FLONASE) 50 MCG/ACT nasal spray, Place 2 sprays into both nostrils daily., Disp: 16 g, Rfl: 0 .  Multiple Vitamin (MULTIVITAMIN) tablet, Take 1 tablet by mouth daily., Disp: , Rfl:  .  ondansetron (ZOFRAN ODT) 8 MG disintegrating tablet, 1/2- 1 tablet q 8 hr prn nausea, vomiting, Disp: 20 tablet, Rfl: 0 .  Probiotic Product (PROBIOTIC-10 PO), Take 1 tablet by mouth daily., Disp: , Rfl:  .  vitamin E 100 UNIT capsule, Take by  mouth daily., Disp: , Rfl:   Allergies  Allergen Reactions  . Hctz [Hydrochlorothiazide]     itching  . Amlodipine Besylate   . Irbesartan Hives    Pt reports causes "hives."     ROS  As noted in HPI.   Physical Exam  BP (!) 155/97 (BP Location: Left Arm)   Pulse 82   Temp 97.8 F (36.6 C) (Oral)   Resp 16   LMP  (Within Weeks) Comment: 3 weeks  SpO2 96%   Constitutional: Well developed, well nourished, no acute distress Eyes:  EOMI, conjunctiva normal bilaterally HENT: Normocephalic, atraumatic,mucus membranes moist.  No nasal congestion.  Erythematous, swollen turbinates.  No sinus tenderness.  Normal tonsils, normal oropharynx, uvula midline.  No obvious postnasal drip. Neck: No cervical adenopathy Respiratory: Normal inspiratory effort, lungs clear bilaterally Cardiovascular: Normal rate regular rhythm no murmurs GI: nondistended nontender, no splenomegaly skin: No rash, skin intact Musculoskeletal: no deformities Neurologic: Alert & oriented x 3, no focal neuro deficits Psychiatric: Speech and behavior appropriate   ED Course   Medications - No data to display  Orders Placed This Encounter  Procedures  . Novel Coronavirus, NAA (Hosp order, Send-out to Ref Lab; TAT 18-24 hrs    Standing Status:   Standing    Number of Occurrences:   1    Order Specific Question:   Is this test for diagnosis or screening    Answer:   Diagnosis of ill patient    Order Specific Question:   Symptomatic for COVID-19 as defined by CDC    Answer:   Yes    Order Specific Question:   Date of Symptom Onset    Answer:   01/15/2019    Order Specific Question:   Hospitalized for COVID-19    Answer:   No    Order Specific Question:   Admitted to ICU for COVID-19    Answer:   No    Order Specific Question:   Previously tested for COVID-19    Answer:   Yes    Order Specific Question:   Resident in a congregate (group) care setting    Answer:   No    Order Specific Question:    Employed in healthcare setting    Answer:   No    Order Specific Question:   Pregnant    Answer:   No    No results found for this or any previous visit (from the past 24 hour(s)). No results found.  ED Clinical Impression  1. Viral respiratory infection   2. Encounter for laboratory testing for COVID-19 virus   3. Close exposure to COVID-19 virus      ED Assessment/Plan  Patient with close extended contact with Covid positive patient now with viral respiratory illness.  Covid PCR sent.  Home with  supportive treatment, Zofran, Benadryl/Maalox mixture.  She states that the cough is not particularly bothersome, so deferred antitussives today.  Advised quarantine per [employer] recommendations.  Discussed labs, MDM, treatment plan, and plan for follow-up with patient. Discussed sn/sx that should prompt return to the ED. patient agrees with plan.   Meds ordered this encounter  Medications  . ondansetron (ZOFRAN ODT) 8 MG disintegrating tablet    Sig: 1/2- 1 tablet q 8 hr prn nausea, vomiting    Dispense:  20 tablet    Refill:  0    *This clinic note was created using Lobby[employer]. Therefore, there may be occasional mistakes despite careful proofreading.   ?    Melynda Ripple, MD 01/19/19 Haynes Kerns, MD 01/19/19 [phone]

## 2019-01-20 ENCOUNTER — Ambulatory Visit: Payer: No Typology Code available for payment source | Admitting: Physical Therapy

## 2019-01-21 LAB — NOVEL CORONAVIRUS, NAA (HOSP ORDER, SEND-OUT TO REF LAB; TAT 18-24 HRS): SARS-CoV-2, NAA: NOT DETECTED

## 2019-01-22 ENCOUNTER — Encounter: Payer: No Typology Code available for payment source | Admitting: Physical Therapy

## 2019-01-25 ENCOUNTER — Encounter: Payer: No Typology Code available for payment source | Admitting: Physical Therapy

## 2019-01-27 ENCOUNTER — Encounter: Payer: No Typology Code available for payment source | Admitting: Physical Therapy

## 2019-01-29 ENCOUNTER — Encounter: Payer: Self-pay | Admitting: Family Medicine

## 2019-01-29 ENCOUNTER — Ambulatory Visit (INDEPENDENT_AMBULATORY_CARE_PROVIDER_SITE_OTHER): Payer: No Typology Code available for payment source | Admitting: Family Medicine

## 2019-01-29 ENCOUNTER — Other Ambulatory Visit: Payer: Self-pay

## 2019-01-29 VITALS — BP 130/88 | HR 86 | Temp 98.1°F | Resp 17 | Ht 63.0 in | Wt 199.0 lb

## 2019-01-29 DIAGNOSIS — Z0001 Encounter for general adult medical examination with abnormal findings: Secondary | ICD-10-CM

## 2019-01-29 DIAGNOSIS — E559 Vitamin D deficiency, unspecified: Secondary | ICD-10-CM | POA: Diagnosis not present

## 2019-01-29 DIAGNOSIS — Z1211 Encounter for screening for malignant neoplasm of colon: Secondary | ICD-10-CM

## 2019-01-29 DIAGNOSIS — I10 Essential (primary) hypertension: Secondary | ICD-10-CM

## 2019-01-29 DIAGNOSIS — Z131 Encounter for screening for diabetes mellitus: Secondary | ICD-10-CM

## 2019-01-29 DIAGNOSIS — Z0000 Encounter for general adult medical examination without abnormal findings: Secondary | ICD-10-CM

## 2019-01-29 DIAGNOSIS — D500 Iron deficiency anemia secondary to blood loss (chronic): Secondary | ICD-10-CM

## 2019-01-29 NOTE — Patient Instructions (Signed)
° ° ° °  If you have lab work done today you will be contacted with your lab results within the next 2 weeks.  If you have not heard from us then please contact us. The fastest way to get your results is to register for My Chart. ° ° °IF you received an x-ray today, you will receive an invoice from [facility]. Please contact [facility] at [phone] with questions or concerns regarding your invoice.  ° °IF you received labwork today, you will receive an invoice from [facility]. Please contact [facility] at [phone] with questions or concerns regarding your invoice.  ° °Our billing staff will not be able to assist you with questions regarding bills from these companies. ° °You will be contacted with the lab results as soon as they are available. The fastest way to get your results is to activate your My Chart account. Instructions are located on the last page of this paperwork. If you have not heard from us regarding the results in 2 weeks, please contact this office. °  ° ° ° °

## 2019-01-29 NOTE — Progress Notes (Signed)
Chief Complaint  Patient presents with  . Annual Exam    no pap, pap scheduled for 02/18/19 with gyn,   . Weight Loss    recommendations for how to lose the 20lbs. she has gained    Subjective:  Belinda Petersen is a 53 y.o. female here for a health maintenance visit.  Patient is established pt  Obesity Body mass index is 35.25 kg/m. Patient was 224 # in 2016. She exercised and calorie restricted her diet to achieve 174 # weight in 2018.  She reports that she stopped exercising during the pandemic and when her husband was injured and regained 20#. She is interested in weight loss. She is able to do exercise . She denies any joint issues right now. Only if she jumps rope.  Wt Readings from Last 3 Encounters:  01/29/19 199 lb (90.3 kg)  12/30/18 198 lb 9.6 oz (90.1 kg)  11/11/18 193 lb 12.8 oz (87.9 kg)   Colon Cancer Screening She has never had a colonoscopy She denies blood in his stool, unexpected weight loss or pain with defecation No rectal itching She does not smoke She does not have a family history of colon cancer    Patient Active Problem List   Diagnosis Date Noted  . Hyperlipidemia 11/10/2018  . History of syncope 11/10/2018  . DOE (dyspnea on exertion) 05/04/2014  . Hypertensive urgency 10/14/2013  . Chest pain 10/13/2013  . Fibroids, submucosal 06/08/2012  . Menorrhagia 06/08/2012  . Fibroid uterus 04/29/2012  . 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 Essential hypertension, benign 04/29/2012    Past Medical History:  Diagnosis Date  . Fibroids   . Headache(784.0)   . HTN (hypertension)    no meds  . Iron deficiency anemia secondary to blood loss (chronic)   . Menorrhagia   . SVD (spontaneous vaginal delivery)    x 1    Past Surgical History:  Procedure Laterality Date  . DILITATION & CURRETTAGE/HYSTROSCOPY WITH VERSAPOINT RESECTION N/A 06/08/2012   Procedure: DILATATION & CURETTAGE/HYSTEROSCOPY WITH VERSAPOINT  RESECTION;  Surgeon: Lyman Speller, MD;  Location: [facility];  Service: Gynecology;  Laterality: N/A;  . WISDOM TOOTH EXTRACTION       Outpatient Medications Prior to Visit  Medication Sig Dispense Refill  . albuterol (PROVENTIL HFA;VENTOLIN HFA) 108 (90 Base) MCG/ACT inhaler Inhale 2 puffs into the lungs every 4 (four) hours as needed for wheezing or shortness of breath. 1 Inhaler 0  . Biotin 1 MG CAPS Take 1 mg by mouth daily.    . chlorthalidone (HYGROTON) 25 MG tablet Take 1 tablet (25 mg total) by mouth daily. 90 tablet 3  . cholecalciferol (VITAMIN D) 1000 units tablet Take 1,000 Units by mouth daily.    . Cinnamon 500 MG capsule Take 500 mg by mouth daily.    . cyclobenzaprine (FLEXERIL) 10 MG tablet Take 1 tablet by mouth 3 times daily as needed for muscle spasm. Warning: May cause drowsiness. 21 tablet 0  . diclofenac (VOLTAREN) 75 MG EC tablet Take 1 tablet (75 mg total) by mouth 2 (two) times daily. 14 tablet 0  . Ferrous Sulfate (IRON) 325 (65 Fe) MG TABS Take 325 mg by mouth daily.    . fluticasone (FLONASE) 50 MCG/ACT nasal spray Place 2 sprays into both nostrils daily. 16 g 0  . Multiple Vitamin (MULTIVITAMIN) tablet Take 1 tablet by mouth daily.    . Probiotic Product (PROBIOTIC-10 PO) Take 1 tablet by mouth daily.    Marland Kitchen  vitamin E 100 UNIT capsule Take by mouth daily.    . ondansetron (ZOFRAN ODT) 8 MG disintegrating tablet 1/2- 1 tablet q 8 hr prn nausea, vomiting (Patient not taking: Reported on 01/29/2019) 20 tablet 0   No facility-administered medications prior to visit.    Allergies  Allergen Reactions  . Hctz [Hydrochlorothiazide]     itching  . Amlodipine Besylate   . Irbesartan Hives    Pt reports causes "hives."     Family History  Problem Relation Age of Onset  . Diabetes Father   . Hypertension Father   . Lung cancer Father   . Hypertension Mother   . Congestive Heart Failure Mother   . Hypertension Brother   . Hypertension Brother   .  Hypertension Brother   . Hypertension Sister   . Heart disease Brother   . Heart attack Brother   . Breast cancer Paternal Grandmother   . Heart disease Maternal Grandmother        pacemaker  . Hypertension Maternal Grandmother   . Lung cancer Paternal Grandfather      Health Habits: Dental Exam: up to date Eye Exam: up to date Exercise: 0 times/week on average Current exercise activities: walking/running Diet: balanced   Social History   Socioeconomic History  . Marital status: Married    Spouse name: Not on file  . Number of children: 1  . Years of education: Not on file  . Highest education level: Not on file  Occupational History    Employer: [employer]  Tobacco Use  . Smoking status: Never Smoker  . Smokeless tobacco: Never Used  Substance and Sexual Activity  . Alcohol use: Yes    Alcohol/week: 0.0 standard drinks    Comment: 1 glass twice a month  . Drug use: No  . Sexual activity: Yes    Partners: Male    Birth control/protection: None  Other Topics Concern  . Not on file  Social History Narrative  . Not on file   Social Determinants of Health   Financial Resource Strain:   . Difficulty of Paying Living Expenses: Not on file  Food Insecurity:   . Worried About Charity fundraiser in the Last Year: Not on file  . Ran Out of Food in the Last Year: Not on file  Transportation Needs:   . Lack of Transportation (Medical): Not on file  . Lack of Transportation (Non-Medical): Not on file  Physical Activity:   . Days of Exercise per Week: Not on file  . Minutes of Exercise per Session: Not on file  Stress:   . Feeling of Stress : Not on file  Social Connections:   . Frequency of Communication with Friends and Family: Not on file  . Frequency of Social Gatherings with Friends and Family: Not on file  . Attends Religious Services: Not on file  . Active Member of Clubs or Organizations: Not on file  . Attends Archivist Meetings:  Not on file  . Marital Status: Not on file  Intimate Partner Violence:   . Fear of Current or Ex-Partner: Not on file  . Emotionally Abused: Not on file  . Physically Abused: Not on file  . Sexually Abused: Not on file   Social History   Substance and Sexual Activity  Alcohol Use Yes  . Alcohol/week: 0.0 standard drinks   Comment: 1 glass twice a month   Social History   Tobacco Use  Smoking Status Never Smoker  Smokeless Tobacco Never Used   Social History   Substance and Sexual Activity  Drug Use No    GYN: Sexual Health Menstrual status: regular menses LMP: Patient's last menstrual period was 01/01/2019. Last pap smear: see HM section History of abnormal pap smears:  Sexually active:  with female partner Current contraception: none  Health Maintenance: See under health Maintenance activity for review of completion dates as well. Immunization History  Administered Date(s) Administered  . Influenza,inj,Quad PF,6+ Mos 01/21/2018, 11/18/2018      Depression Screen-PHQ2/9 Depression screen [facility] 2/9 01/29/2019 12/30/2018 07/20/2018 07/08/2018 06/30/2018  Decreased Interest 0 0 0 0 0  Down, Depressed, Hopeless 0 0 0 0 0  PHQ - 2 Score 0 0 0 0 0       Depression Severity and Treatment Recommendations:  0-4= None  5-9= Mild / Treatment: Support, educate to call if worse; return in one month  10-14= Moderate / Treatment: Support, watchful waiting; Antidepressant or Psycotherapy  15-19= Moderately severe / Treatment: Antidepressant OR Psychotherapy  >= 20 = Major depression, severe / Antidepressant AND Psychotherapy    Review of Systems   ROS  See HPI for ROS as well.  Review of Systems  Constitutional: Negative for activity change, appetite change, chills and fever.  HENT: Negative for congestion, nosebleeds, trouble swallowing and voice change.   Respiratory: Negative for cough, shortness of breath and wheezing.   Gastrointestinal: Negative for diarrhea,  nausea and vomiting.  Genitourinary: Negative for difficulty urinating, dysuria, flank pain and hematuria.  Musculoskeletal: Negative for back pain, joint swelling and neck pain.  Neurological: Negative for dizziness, speech difficulty, light-headedness and numbness.  See HPI. All other review of systems negative.    Objective:   Vitals:   01/29/19 0955  BP: 130/88  Pulse: 86  Resp: 17  Temp: 98.1 F (36.7 C)  TempSrc: Oral  SpO2: 100%  Weight: 199 lb (90.3 kg)  Height: 5\' 3"  (1.6 m)    Body mass index is 35.25 kg/m.  Physical Exam  Physical Exam  Constitutional: Oriented to person, place, and time. Appears well-developed and well-nourished.  HENT:  Head: Normocephalic and atraumatic.  Eyes: Conjunctivae and EOM are normal.  Ears: TM clear, no exudate Neck: supple, no thyromegaly Cardiovascular: Normal rate, regular rhythm, normal heart sounds and intact distal pulses.  No murmur heard. Pulmonary/Chest: Effort normal and breath sounds normal. No stridor. No respiratory distress. Has no wheezes.  Abdomen: non-distended, normoactive bs, soft, nontender Neurological: Is alert and oriented to person, place, and time.  Skin: Skin is warm. Capillary refill takes less than 2 seconds.  Psychiatric: Has a normal mood and affect. Behavior is normal. Judgment and thought content normal.     Assessment/Plan:   Patient was seen for a health maintenance exam.  Counseled the patient on health maintenance issues. Reviewed her health mainteance schedule and ordered appropriate tests (see orders.) Counseled on regular exercise and weight management. Recommend regular eye exams and dental cleaning.   The following issues were addressed today for health maintenance:   Noni was seen today for annual exam and weight loss.  Diagnoses and all orders for this visit:  Encounter for health maintenance examination in adult -  Women's Health Maintenance Plan Advised monthly breast exam  and annual mammogram Advised dental exam every six months Discussed stress management Discussed pap smear screening guidelines  Screening for colon cancer -     Cancel: Ambulatory referral to Gastroenterology -     Ambulatory referral to  Gastroenterology  Essential hypertension, benign -   Patient's blood pressure is at goal of 139/89 or less. Condition is stable. Continue current medications and treatment plan. I recommend that you exercise for 30-45 minutes 5 days a week. I also recommend a balanced diet with fruits and vegetables every day, lean meats, and little fried foods. The DASH diet (you can find this online) is a good example of this.  -     Lipid panel -     Basic metabolic panel  Vitamin D deficiency- currently on vitamin D 1000mg  -     Vitamin D, 25-hydroxy  Iron deficiency anemia secondary to blood loss (chronic) -     CBC  Screening for diabetes mellitus -     Basic metabolic panel    No follow-ups on file.    Body mass index is 35.25 kg/m.:  Discussed the patient's BMI with patient. The BMI body mass index is 35.25 kg/m.     Future Appointments  Date Time Provider [place]  02/03/2019  9:30 AM Hosie [facility]  02/18/2019  9:00 AM Salvadore Dom, MD [place] None    Patient Instructions       If you have lab work done today you will be contacted with your lab results within the next 2 weeks.  If you have not heard from [place] then please contact us. The fastest way to get your results is to register for My Chart.   IF you received an x-ray today, you will receive an invoice from [facility]. Please contact [facility] at [phone] with questions or concerns regarding your invoice.   IF you received labwork today, you will receive an invoice from [facility]. Please contact [facility] at [phone] with questions or concerns regarding your invoice.   Our billing staff will not be able to assist you  with questions regarding bills from these companies.  You will be contacted with the lab results as soon as they are available. The fastest way to get your results is to activate your My Chart account. Instructions are located on the last page of this paperwork. If you have not heard from [place] regarding the results in 2 weeks, please contact this office.

## 2019-01-30 LAB — CBC
Hematocrit: 35 % (ref 34.0–46.6)
Hemoglobin: 11.4 g/dL (ref 11.1–15.9)
MCH: 27.4 pg (ref 26.6–33.0)
MCHC: 32.6 g/dL (ref 31.5–35.7)
MCV: 84 fL (ref 79–97)
Platelets: 260 10*3/uL (ref 150–450)
RBC: 4.16 x10E6/uL (ref 3.77–5.28)
RDW: 14.2 % (ref 11.7–15.4)
WBC: 3.4 10*3/uL (ref 3.4–10.8)

## 2019-01-30 LAB — LIPID PANEL
Chol/HDL Ratio: 3.1 ratio (ref 0.0–4.4)
Cholesterol, Total: 216 mg/dL — ABNORMAL HIGH (ref 100–199)
HDL: 69 mg/dL (ref >39)
LDL Chol Calc (NIH): 136 mg/dL — ABNORMAL HIGH (ref 0–99)
Triglycerides: 63 mg/dL (ref 0–149)
VLDL Cholesterol Cal: 11 mg/dL (ref 5–40)

## 2019-01-30 LAB — BASIC METABOLIC PANEL
BUN/Creatinine Ratio: 16 (ref 9–23)
BUN: 17 mg/dL (ref 6–24)
CO2: 26 mmol/L (ref 20–29)
Calcium: 9.7 mg/dL (ref 8.7–10.2)
Chloride: 100 mmol/L (ref 96–106)
Creatinine, Ser: 1.07 mg/dL — ABNORMAL HIGH (ref 0.57–1.00)
GFR calc Af Amer: 69 mL/min/{1.73_m2} (ref >59)
GFR calc non Af Amer: 60 mL/min/{1.73_m2} (ref >59)
Glucose: 86 mg/dL (ref 65–99)
Potassium: 3.8 mmol/L (ref 3.5–5.2)
Sodium: 137 mmol/L (ref 134–144)

## 2019-01-30 LAB — VITAMIN D 25 HYDROXY (VIT D DEFICIENCY, FRACTURES): Vit D, 25-Hydroxy: 41.4 ng/mL (ref 30.0–100.0)

## 2019-02-01 ENCOUNTER — Ambulatory Visit (AMBULATORY_SURGERY_CENTER): Payer: No Typology Code available for payment source | Admitting: *Deleted

## 2019-02-01 ENCOUNTER — Other Ambulatory Visit: Payer: Self-pay

## 2019-02-01 VITALS — Temp 96.9°F | Ht 63.0 in | Wt 201.0 lb

## 2019-02-01 DIAGNOSIS — Z1159 Encounter for screening for other viral diseases: Secondary | ICD-10-CM

## 2019-02-01 DIAGNOSIS — Z1211 Encounter for screening for malignant neoplasm of colon: Secondary | ICD-10-CM

## 2019-02-01 MED ORDER — SUPREP BOWEL PREP KIT 17.5-3.13-1.6 GM/177ML PO SOLN: 1.0000 | Freq: Once | ORAL | 0 refills | Status: AC

## 2019-02-01 NOTE — Progress Notes (Signed)
No egg or soy allergy known to patient  No issues with past sedation with any surgeries  or procedures, no intubation problems  No diet pills per patient No home 02 use per patient  No blood thinners per patient  Pt denies issues with constipation  No A fib or A flutter  EMMI video sent to pt's e mail   Due to the COVID-19 pandemic we are asking patients to follow these guidelines. Please only bring one care partner. Please be aware that your care partner may wait in the car in the parking lot or if they feel like they will be too hot to wait in the car, they may wait in the lobby on the 4th floor. All care partners are required to wear a mask the entire time (we do not have any that we can provide them), they need to practice social distancing, and we will do a Covid check for all patient's and care partners when you arrive. Also we will check their temperature and your temperature. If the care partner waits in their car they need to stay in the parking lot the entire time and we will call them on their cell phone when the patient is ready for discharge so they can bring the car to the front of the building. Also all patient's will need to wear a mask into building. Suprep $15

## 2019-02-03 ENCOUNTER — Telehealth: Payer: Self-pay | Admitting: Physical Therapy

## 2019-02-03 ENCOUNTER — Ambulatory Visit: Payer: No Typology Code available for payment source | Admitting: Physical Therapy

## 2019-02-03 NOTE — Telephone Encounter (Signed)
Attempted to call regarding no show for therapy appointment-left voicemail to call back to reschedule.

## 2019-02-15 ENCOUNTER — Telehealth: Payer: Self-pay | Admitting: Gastroenterology

## 2019-02-15 NOTE — Telephone Encounter (Signed)
Pt is scheduled for a colonoscopy tomorrow.  She stated that she had covid test done at the [facility] and results were negative.  Please advise.

## 2019-02-15 NOTE — Telephone Encounter (Signed)
Patient did call back, and she will bring proof that her test was negative.

## 2019-02-15 NOTE — Progress Notes (Signed)
54 y.o. G6P0101 Married Black or [place] American Not Hispanic or Latino female here for annual exam.  She states that last night she was having left breast discomfort, but thinks it may of been how she laid for the colonoscopy that was done 2 days ago.  Period Cycle (Days): 28 Period Duration (Days): 4-5 days Period Pattern: Regular Menstrual Flow: Heavy Menstrual Control: Tampon, Thin pad Menstrual Control Change Freq (Hours): First 2 days every 30 min to an hour Dysmenorrhea: (!) Severe Dysmenorrhea Symptoms: Cramping  Cramps are very bad for 1-2 days, helped with midol.  She is spotting currently for the last 2 days. Spotted in November as well.   She has a known fibroid uterus and a h/o menorrhagia leading to anemia. She is on iron and her hgb from 2 weeks ago was normal. She isn't a candidate for OCP's and didn't tolerate POP's. Last year we discussed possible hysteroscopic myomectomy and mirena IUD placement (would do sonohysterogram first). Also discussed the option of uterine artery embolization and hysterectomy.   Patient's last menstrual period was 02/03/2019.          Sexually active: Yes.    The current method of family planning is none.    Exercising: Yes.    Walking and jumping rope Smoker:  no  Maintenance: Pap:02/02/2018 WNL, negative hpv History of abnormal Pap:no MMG:04/20/18 Density C Bi-rads 1 neg  Colonoscopy:02/16/19 found 2 polyps awaiting results. [id number] TDaP:Up to date Gardasil:N/A    reports that she has never smoked. She has never used smokeless tobacco. She reports current alcohol use. She reports that she does not use drugs. Husband is a Theme park manager, son is a Equities trader in college. She and her husband are feeding the homeless. She works as a Research officer, political party at [employer].   Past Medical History:  Diagnosis Date  . Allergy   . COVID-19 virus infection 06/30/2018   has since had multiple negatibe tests   . Fibroids   . Headache(784.0)   .  HTN (hypertension)    no meds  . Hyperlipidemia   . Iron deficiency anemia secondary to blood loss (chronic)   . Menorrhagia   . SVD (spontaneous vaginal delivery)    x 1    Past Surgical History:  Procedure Laterality Date  . DILITATION & CURRETTAGE/HYSTROSCOPY WITH VERSAPOINT RESECTION N/A 06/08/2012   Procedure: DILATATION & CURETTAGE/HYSTEROSCOPY WITH VERSAPOINT RESECTION;  Surgeon: Lyman Speller, MD;  Location: [facility];  Service: Gynecology;  Laterality: N/A;  . WISDOM TOOTH EXTRACTION      Current Outpatient Medications  Medication Sig Dispense Refill  . albuterol (PROVENTIL HFA;VENTOLIN HFA) 108 (90 Base) MCG/ACT inhaler Inhale 2 puffs into the lungs every 4 (four) hours as needed for wheezing or shortness of breath. 1 Inhaler 0  . Biotin 1 MG CAPS Take 1 mg by mouth daily.    . chlorthalidone (HYGROTON) 25 MG tablet Take 1 tablet (25 mg total) by mouth daily. 90 tablet 3  . cholecalciferol (VITAMIN D) 1000 units tablet Take 1,000 Units by mouth daily.    . Cinnamon 500 MG capsule Take 500 mg by mouth daily.    . diclofenac (VOLTAREN) 75 MG EC tablet Take 1 tablet (75 mg total) by mouth 2 (two) times daily. 14 tablet 0  . Ferrous Sulfate (IRON) 325 (65 Fe) MG TABS Take 325 mg by mouth daily.    . fluticasone (FLONASE) 50 MCG/ACT nasal spray Place 2 sprays into both nostrils daily. 16 g 0  .  Multiple Vitamin (MULTIVITAMIN) tablet Take 1 tablet by mouth daily.    . Probiotic Product (PROBIOTIC-10 PO) Take 1 tablet by mouth daily.    . vitamin E 100 UNIT capsule Take by mouth daily.     No current facility-administered medications for this visit.    Family History  Problem Relation Age of Onset  . Diabetes Father   . Hypertension Father   . Lung cancer Father   . Hypertension Mother   . Congestive Heart Failure Mother   . Hypertension Brother   . Hypertension Brother   . Hypertension Brother   . Hypertension Sister   . Heart disease Brother   . Heart attack  Brother   . Breast cancer Paternal Grandmother   . Heart disease Maternal Grandmother        pacemaker  . Hypertension Maternal Grandmother   . Lung cancer Paternal Grandfather   . Colon cancer Neg Hx   . Colon polyps Neg Hx   . Esophageal cancer Neg Hx   . Rectal cancer Neg Hx   . Stomach cancer Neg Hx     Review of Systems  Genitourinary:       Left breast pain   All other systems reviewed and are negative.   Exam:   BP 130/64   Pulse 86   Temp 97.7 F (36.5 C)   Ht 5' 3.5" (1.613 m)   Wt 200 lb (90.7 kg)   LMP 02/03/2019   SpO2 98%   BMI 34.87 kg/m   Weight change: @WEIGHTCHANGE @ Height:   Height: 5' 3.5" (161.3 cm)  Ht Readings from Last 3 Encounters:  02/18/19 5' 3.5" (1.613 m)  02/16/19 5\' 3"  (1.6 m)  02/01/19 5\' 3"  (1.6 m)    General appearance: alert, cooperative and appears stated age Head: Normocephalic, without obvious abnormality, atraumatic Neck: no adenopathy, supple, symmetrical, trachea midline and thyroid normal to inspection and palpation Lungs: clear to auscultation bilaterally Cardiovascular: regular rate and rhythm Breasts: normal appearance, no masses or tenderness Abdomen: soft, non-tender; non distended,  no masses,  no organomegaly Extremities: extremities normal, atraumatic, no cyanosis or edema Skin: Skin color, texture, turgor normal. No rashes or lesions Lymph nodes: Cervical, supraclavicular, and axillary nodes normal. No abnormal inguinal nodes palpated Neurologic: Grossly normal   Pelvic: External genitalia:  no lesions              Urethra:  normal appearing urethra with no masses, tenderness or lesions              Bartholins and Skenes: normal                 Vagina: normal appearing vagina with normal color and discharge, no lesions              Cervix: no lesions               Bimanual Exam:  Uterus:  enlarged, irregular, non tender, ~10-12 week sized              Adnexa: no mass, fullness, tenderness                Rectovaginal: Confirms               Anus:  normal sphincter tone, no lesions  Gae Dry chaperoned for the exam.  A:  Well Woman with normal exam  Fibroid uterus  Menorrhagia, unchanged, tolerable to her  2 episodes of intermenstrual spotting, at least one midcycle  Severe dysmenorrhea controlled with midol  H/O anemia, on iron not currently anemic  P:   No pap this year  Mammogram in 3/20  Colonoscopy just done  Discussed breast self exam  Discussed calcium and vit D intake  Labs with primary  Calendar cycles, call if skips a cycle (concerned that an anovulatory bleed could be extremely heavy), call if spotting is other than midcycle.

## 2019-02-15 NOTE — Telephone Encounter (Signed)
  Follow up Call-  No flowsheet data found.   Patient questions:  Called patient, and got her VM.  I did leave her aq message regarding  Her negative covid test.  We will need an official paper stating that her test was negative.  She was told to call us if she had any issues

## 2019-02-16 ENCOUNTER — Ambulatory Visit (AMBULATORY_SURGERY_CENTER): Payer: No Typology Code available for payment source | Admitting: Gastroenterology

## 2019-02-16 ENCOUNTER — Encounter: Payer: Self-pay | Admitting: Gastroenterology

## 2019-02-16 ENCOUNTER — Other Ambulatory Visit: Payer: Self-pay

## 2019-02-16 VITALS — BP 129/88 | HR 77 | Temp 98.7°F | Resp 12 | Ht 63.0 in | Wt 201.0 lb

## 2019-02-16 DIAGNOSIS — D125 Benign neoplasm of sigmoid colon: Secondary | ICD-10-CM | POA: Diagnosis not present

## 2019-02-16 DIAGNOSIS — Z1211 Encounter for screening for malignant neoplasm of colon: Secondary | ICD-10-CM

## 2019-02-16 DIAGNOSIS — D128 Benign neoplasm of rectum: Secondary | ICD-10-CM

## 2019-02-16 DIAGNOSIS — K621 Rectal polyp: Secondary | ICD-10-CM

## 2019-02-16 MED ORDER — SODIUM CHLORIDE 0.9 % IV SOLN: 500.0000 mL | Freq: Once | INTRAVENOUS | Status: DC

## 2019-02-16 NOTE — Op Note (Signed)
[facility] Patient Name: Belinda Petersen Procedure Date: 02/16/2019 2:15 PM MRN: [id number] Endoscopist: Mauri Pole , MD Age: 54 Referring MD:  Date of Birth: 11/13/1965 Gender: Female Account #: [id number] Procedure:                Colonoscopy Indications:              Screening for colorectal malignant neoplasm Medicines:                Monitored Anesthesia Care Procedure:                Pre-Anesthesia Assessment:                           - Prior to the procedure, a History and Physical                            was performed, and patient medications and                            allergies were reviewed. The patient's tolerance of                            previous anesthesia was also reviewed. The risks                            and benefits of the procedure and the sedation                            options and risks were discussed with the patient.                            All questions were answered, and informed consent                            was obtained. Prior Anticoagulants: The patient has                            taken no previous anticoagulant or antiplatelet                            agents. ASA Grade Assessment: II - A patient with                            mild systemic disease. After reviewing the risks                            and benefits, the patient was deemed in                            satisfactory condition to undergo the procedure.                           After obtaining informed consent, the colonoscope  was passed under direct vision. Throughout the                            procedure, the patient's blood pressure, pulse, and                            oxygen saturations were monitored continuously. The                            Colonoscope was introduced through the anus and                            advanced to the the cecum, identified by                            appendiceal orifice  and ileocecal valve. The                            colonoscopy was performed without difficulty. The                            patient tolerated the procedure well. The quality                            of the bowel preparation was excellent. The                            ileocecal valve, appendiceal orifice, and rectum                            were photographed. Scope In: 2:29:52 PM Scope Out: 2:47:51 PM Scope Withdrawal Time: 0 hours 12 minutes 44 seconds  Total Procedure Duration: 0 hours 17 minutes 59 seconds  Findings:                 The perianal and digital rectal examinations were                            normal.                           A 12 mm polyp was found in the sigmoid colon. The                            polyp was semi-pedunculated. The polyp was removed                            with a hot snare. Resection and retrieval were                            complete.                           A less than 1 mm polyp was found in the rectum. The  polyp was sessile. The polyp was removed with a                            cold biopsy forceps. Resection and retrieval were                            complete.                           Non-bleeding internal hemorrhoids were found during                            retroflexion. The hemorrhoids were small.                           The exam was otherwise without abnormality. Complications:            No immediate complications. Estimated Blood Loss:     Estimated blood loss was minimal. Impression:               - One 12 mm polyp in the sigmoid colon, removed                            with a hot snare. Resected and retrieved.                           - One less than 1 mm polyp in the rectum, removed                            with a cold biopsy forceps. Resected and retrieved.                           - Non-bleeding internal hemorrhoids.                           - The examination was otherwise  normal. Recommendation:           - Patient has a contact number available for                            emergencies. The signs and symptoms of potential                            delayed complications were discussed with the                            patient. Return to normal activities tomorrow.                            Written discharge instructions were provided to the                            patient.                           - Resume previous diet.                           -  Continue present medications.                           - Await pathology results.                           - Repeat colonoscopy in 3 years for surveillance                            based on pathology results. Mauri Pole, MD 02/16/2019 2:59:26 PM This report has been signed electronically.

## 2019-02-16 NOTE — Progress Notes (Signed)
Temp by JR, vitals by JJ  Pt's states no medical or surgical changes since previsit or office visit.

## 2019-02-16 NOTE — Patient Instructions (Signed)
You will need a repeat colonoscopy in about 3 years. Please, read all of the handouts given to you by your recovery room nurse.  Thank-you for choosing [place] for your healthcare needs today.  YOU HAD AN ENDOSCOPIC PROCEDURE TODAY AT [facility]:   Refer to the procedure report that was given to you for any specific questions about what was found during the examination.  If the procedure report does not answer your questions, please call your gastroenterologist to clarify.  If you requested that your care partner not be given the details of your procedure findings, then the procedure report has been included in a sealed envelope for you to review at your convenience later.  YOU SHOULD EXPECT: Some feelings of bloating in the abdomen. Passage of more gas than usual.  Walking can help get rid of the air that was put into your GI tract during the procedure and reduce the bloating. If you had a lower endoscopy (such as a colonoscopy or flexible sigmoidoscopy) you may notice spotting of blood in your stool or on the toilet paper. If you underwent a bowel prep for your procedure, you may not have a normal bowel movement for a few days.  Please Note:  You might notice some irritation and congestion in your nose or some drainage.  This is from the oxygen used during your procedure.  There is no need for concern and it should clear up in a day or so.  SYMPTOMS TO REPORT IMMEDIATELY:   Following lower endoscopy (colonoscopy or flexible sigmoidoscopy):  Excessive amounts of blood in the stool  Significant tenderness or worsening of abdominal pains  Swelling of the abdomen that is new, acute  Fever of 100F or higher   For urgent or emergent issues, a gastroenterologist can be reached at any hour by calling [phone].   DIET:  We do recommend a small meal at first, but then you may proceed to your regular diet.  Drink plenty of fluids but you should avoid alcoholic beverages for 24  hours.  ACTIVITY:  You should plan to take it easy for the rest of today and you should NOT DRIVE or use heavy machinery until tomorrow (because of the sedation medicines used during the test).    FOLLOW UP: Our staff will call the number listed on your records 48-72 hours following your procedure to check on you and address any questions or concerns that you may have regarding the information given to you following your procedure. If we do not reach you, we will leave a message.  We will attempt to reach you two times.  During this call, we will ask if you have developed any symptoms of COVID 19. If you develop any symptoms (ie: fever, flu-like symptoms, shortness of breath, cough etc.) before then, please call [phone].  If you test positive for Covid 19 in the 2 weeks post procedure, please call and report this information to [place].    If any biopsies were taken you will be contacted by phone or by letter within the next 1-3 weeks.  Please call us at [phone] if you have not heard about the biopsies in 3 weeks.    SIGNATURES/CONFIDENTIALITY: You and/or your care partner have signed paperwork which will be entered into your electronic medical record.  These signatures attest to the fact that that the information above on your After Visit Summary has been reviewed and is understood.  Full responsibility of the confidentiality of this  discharge information lies with you and/or your care-partner. 

## 2019-02-16 NOTE — Progress Notes (Signed)
Pt tolerated well. VSS. Awake and to recovery. 

## 2019-02-18 ENCOUNTER — Telehealth: Payer: Self-pay | Admitting: *Deleted

## 2019-02-18 ENCOUNTER — Ambulatory Visit: Payer: No Typology Code available for payment source | Admitting: Obstetrics and Gynecology

## 2019-02-18 ENCOUNTER — Other Ambulatory Visit: Payer: Self-pay

## 2019-02-18 ENCOUNTER — Encounter: Payer: Self-pay | Admitting: Obstetrics and Gynecology

## 2019-02-18 VITALS — BP 130/64 | HR 86 | Temp 97.7°F | Ht 63.5 in | Wt 200.0 lb

## 2019-02-18 DIAGNOSIS — Z862 Personal history of diseases of the blood and blood-forming organs and certain disorders involving the immune mechanism: Secondary | ICD-10-CM | POA: Diagnosis not present

## 2019-02-18 DIAGNOSIS — D259 Leiomyoma of uterus, unspecified: Secondary | ICD-10-CM

## 2019-02-18 DIAGNOSIS — N946 Dysmenorrhea, unspecified: Secondary | ICD-10-CM

## 2019-02-18 DIAGNOSIS — Z01419 Encounter for gynecological examination (general) (routine) without abnormal findings: Secondary | ICD-10-CM | POA: Diagnosis not present

## 2019-02-18 DIAGNOSIS — N920 Excessive and frequent menstruation with regular cycle: Secondary | ICD-10-CM

## 2019-02-18 DIAGNOSIS — N923 Ovulation bleeding: Secondary | ICD-10-CM

## 2019-02-18 NOTE — Telephone Encounter (Signed)
  Follow up Call-  Call back number 02/16/2019  Post procedure Call Back phone  # [phone]  Permission to leave phone message Yes  Some recent data might be hidden     Patient questions:  Do you have a fever, pain , or abdominal swelling? No. Pain Score  0 *  Have you tolerated food without any problems? Yes.    Have you been able to return to your normal activities? Yes.    Do you have any questions about your discharge instructions: Diet   No. Medications  No. Follow up visit  No.  Do you have questions or concerns about your Care? No.  Actions: * If pain score is 4 or above: No action needed, pain <4.  1. Have you developed a fever since your procedure? no  2.   Have you had an respiratory symptoms (SOB or cough) since your procedure? no  3.   Have you tested positive for COVID 19 since your procedure no  4.   Have you had any family members/close contacts diagnosed with the COVID 19 since your procedure?  no   If yes to any of these questions please route to Joylene John, RN and Alphonsa Gin, Therapist, sports.

## 2019-02-18 NOTE — Patient Instructions (Addendum)
EXERCISE AND DIET:  We recommended that you start or continue a regular exercise program for good health. Regular exercise means any activity that makes your heart beat faster and makes you sweat.  We recommend exercising at least 30 minutes per day at least 3 days a week, preferably 4 or 5.  We also recommend a diet low in fat and sugar.  Inactivity, poor dietary choices and obesity can cause diabetes, heart attack, stroke, and kidney damage, among others.    ALCOHOL AND SMOKING:  Women should limit their alcohol intake to no more than 7 drinks/beers/glasses of wine (combined, not each!) per week. Moderation of alcohol intake to this level decreases your risk of breast cancer and liver damage. And of course, no recreational drugs are part of a healthy lifestyle.  And absolutely no smoking or even second hand smoke. Most people know smoking can cause heart and lung diseases, but did you know it also contributes to weakening of your bones? Aging of your skin?  Yellowing of your teeth and nails?  CALCIUM AND VITAMIN D:  Adequate intake of calcium and Vitamin D are recommended.  The recommendations for exact amounts of these supplements seem to change often, but generally speaking 1,000 mg of calcium (between diet and supplement) and 800 units of Vitamin D per day seems prudent. Certain women may benefit from higher intake of Vitamin D.  If you are among these women, your doctor will have told you during your visit.    PAP SMEARS:  Pap smears, to check for cervical cancer or precancers,  have traditionally been done yearly, although recent scientific advances have shown that most women can have pap smears less often.  However, every woman still should have a physical exam from her gynecologist every year. It will include a breast check, inspection of the vulva and vagina to check for abnormal growths or skin changes, a visual exam of the cervix, and then an exam to evaluate the size and shape of the uterus and  ovaries.  And after 53 years of age, a rectal exam is indicated to check for rectal cancers. We will also provide age appropriate advice regarding health maintenance, like when you should have certain vaccines, screening for sexually transmitted diseases, bone density testing, colonoscopy, mammograms, etc.   MAMMOGRAMS:  All women over 40 years old should have a yearly mammogram. Many facilities now offer a "3D" mammogram, which may cost around $50 extra out of pocket. If possible,  we recommend you accept the option to have the 3D mammogram performed.  It both reduces the number of women who will be called back for extra views which then turn out to be normal, and it is better than the routine mammogram at detecting truly abnormal areas.    COLON CANCER SCREENING: Now recommend starting at age 45. At this time colonoscopy is not covered for routine screening until 50. There are take home tests that can be done between 45-49.   COLONOSCOPY:  Colonoscopy to screen for colon cancer is recommended for all women at age 50.  We know, you hate the idea of the prep.  We agree, BUT, having colon cancer and not knowing it is worse!!  Colon cancer so often starts as a polyp that can be seen and removed at colonscopy, which can quite literally save your life!  And if your first colonoscopy is normal and you have no family history of colon cancer, most women don't have to have it again for   10 years.  Once every ten years, you can do something that may end up saving your life, right?  We will be happy to help you get it scheduled when you are ready.  Be sure to check your insurance coverage so you understand how much it will cost.  It may be covered as a preventative service at no cost, but you should check your particular policy.      Breast Self-Awareness Breast self-awareness means being familiar with how your breasts look and feel. It involves checking your breasts regularly and reporting any changes to your  health care provider. Practicing breast self-awareness is important. A change in your breasts can be a sign of a serious medical problem. Being familiar with how your breasts look and feel allows you to find any problems early, when treatment is more likely to be successful. All women should practice breast self-awareness, including women who have had breast implants. How to do a breast self-exam One way to learn what is normal for your breasts and whether your breasts are changing is to do a breast self-exam. To do a breast self-exam: Look for Changes  1. Remove all the clothing above your waist. 2. Stand in front of a mirror in a room with good lighting. 3. Put your hands on your hips. 4. Push your hands firmly downward. 5. Compare your breasts in the mirror. Look for differences between them (asymmetry), such as: ? Differences in shape. ? Differences in size. ? Puckers, dips, and bumps in one breast and not the other. 6. Look at each breast for changes in your skin, such as: ? Redness. ? Scaly areas. 7. Look for changes in your nipples, such as: ? Discharge. ? Bleeding. ? Dimpling. ? Redness. ? A change in position. Feel for Changes Carefully feel your breasts for lumps and changes. It is best to do this while lying on your back on the floor and again while sitting or standing in the shower or tub with soapy water on your skin. Feel each breast in the following way:  Place the arm on the side of the breast you are examining above your head.  Feel your breast with the other hand.  Start in the nipple area and make  inch (2 cm) overlapping circles to feel your breast. Use the pads of your three middle fingers to do this. Apply light pressure, then medium pressure, then firm pressure. The light pressure will allow you to feel the tissue closest to the skin. The medium pressure will allow you to feel the tissue that is a little deeper. The firm pressure will allow you to feel the tissue  close to the ribs.  Continue the overlapping circles, moving downward over the breast until you feel your ribs below your breast.  Move one finger-width toward the center of the body. Continue to use the  inch (2 cm) overlapping circles to feel your breast as you move slowly up toward your collarbone.  Continue the up and down exam using all three pressures until you reach your armpit.  Write Down What You Find  Write down what is normal for each breast and any changes that you find. Keep a written record with breast changes or normal findings for each breast. By writing this information down, you do not need to depend only on memory for size, tenderness, or location. Write down where you are in your menstrual cycle, if you are still menstruating. If you are having trouble noticing differences   in your breasts, do not get discouraged. With time you will become more familiar with the variations in your breasts and more comfortable with the exam. How often should I examine my breasts? Examine your breasts every month. If you are breastfeeding, the best time to examine your breasts is after a feeding or after using a breast pump. If you menstruate, the best time to examine your breasts is 5-7 days after your period is over. During your period, your breasts are lumpier, and it may be more difficult to notice changes. When should I see my health care provider? See your health care provider if you notice:  A change in shape or size of your breasts or nipples.  A change in the skin of your breast or nipples, such as a reddened or scaly area.  Unusual discharge from your nipples.  A lump or thick area that was not there before.  Pain in your breasts.  Anything that concerns you.   Perimenopause  Perimenopause is the normal time of life before and after menstrual periods stop completely (menopause). Perimenopause can begin 2-8 years before menopause, and it usually lasts for 1 year after  menopause. During perimenopause, the ovaries may or may not produce an egg. What are the causes? This condition is caused by a natural change in hormone levels that happens as you get older. What increases the risk? This condition is more likely to start at an earlier age if you have certain medical conditions or treatments, including:  A tumor of the pituitary gland in the brain.  A disease that affects the ovaries and hormone production.  Radiation treatment for cancer.  Certain cancer treatments, such as chemotherapy or hormone (anti-estrogen) therapy.  Heavy smoking and excessive alcohol use.  Family history of early menopause. What are the signs or symptoms? Perimenopausal changes affect each woman differently. Symptoms of this condition may include:  Hot flashes.  Night sweats.  Irregular menstrual periods.  Decreased sex drive.  Vaginal dryness.  Headaches.  Mood swings.  Depression.  Memory problems or trouble concentrating.  Irritability.  Tiredness.  Weight gain.  Anxiety.  Trouble getting pregnant. How is this diagnosed? This condition is diagnosed based on your medical history, a physical exam, your age, your menstrual history, and your symptoms. Hormone tests may also be done. How is this treated? In some cases, no treatment is needed. You and your health care provider should make a decision together about whether treatment is necessary. Treatment will be based on your individual condition and preferences. Various treatments are available, such as:  Menopausal hormone therapy (MHT).  Medicines to treat specific symptoms.  Acupuncture.  Vitamin or herbal supplements. Before starting treatment, make sure to let your health care provider know if you have a personal or family history of:  Heart disease.  Breast cancer.  Blood clots.  Diabetes.  Osteoporosis. Follow these instructions at home: Lifestyle  Do not use any products that  contain nicotine or tobacco, such as cigarettes and e-cigarettes. If you need help quitting, ask your health care provider.  Eat a balanced diet that includes fresh fruits and vegetables, whole grains, soybeans, eggs, lean meat, and low-fat dairy.  Get at least 30 minutes of physical activity on 5 or more days each week.  Avoid alcoholic and caffeinated beverages, as well as spicy foods. This may help prevent hot flashes.  Get 7-8 hours of sleep each night.  Dress in layers that can be removed to help you manage hot   flashes.  Find ways to manage stress, such as deep breathing, meditation, or journaling. General instructions  Keep track of your menstrual periods, including: ? When they occur. ? How heavy they are and how long they last. ? How much time passes between periods.  Keep track of your symptoms, noting when they start, how often you have them, and how long they last.  Take over-the-counter and prescription medicines only as told by your health care provider.  Take vitamin supplements only as told by your health care provider. These may include calcium, vitamin E, and vitamin D.  Use vaginal lubricants or moisturizers to help with vaginal dryness and improve comfort during sex.  Talk with your health care provider before starting any herbal supplements.  Keep all follow-up visits as told by your health care provider. This is important. This includes any group therapy or counseling. Contact a health care provider if:  You have heavy vaginal bleeding or pass blood clots.  Your period lasts more than 2 days longer than normal.  Your periods are recurring sooner than 21 days.  You bleed after having sex. Get help right away if:  You have chest pain, trouble breathing, or trouble talking.  You have severe depression.  You have pain when you urinate.  You have severe headaches.  You have vision problems. Summary  Perimenopause is the time when a woman's body  begins to move into menopause. This may happen naturally or as a result of other health problems or medical treatments.  Perimenopause can begin 2-8 years before menopause, and it usually lasts for 1 year after menopause.  Perimenopausal symptoms can be managed through medicines, lifestyle changes, and complementary therapies such as acupuncture. This information is not intended to replace advice given to you by your health care provider. Make sure you discuss any questions you have with your health care provider. Document Revised: 01/10/2017 Document Reviewed: 03/05/2016 Elsevier Patient Education  2020 [employer].  

## 2019-02-18 NOTE — Telephone Encounter (Signed)
Attempted f/u phone call. No answer. Left message. °

## 2019-02-22 ENCOUNTER — Encounter: Payer: Self-pay | Admitting: Gastroenterology

## 2019-03-09 ENCOUNTER — Telehealth: Payer: Self-pay | Admitting: Family Medicine

## 2019-03-09 NOTE — Telephone Encounter (Signed)
Pt needs a new referral for physical therapy where she was referred before. The referral from 12/30/18.please advise at [phone]

## 2019-03-12 NOTE — Telephone Encounter (Signed)
I have attempted to call pt with no response. According to message pt would like another PT referral, however pt has not been seen recently. In order to get another referral we will have to see pt in office and discuss the reason for it.   I have left a message to call back on both numbers

## 2019-03-15 NOTE — Telephone Encounter (Signed)
Per last note some tried to contact her to let her know she need to schedule an office visit to discuss why she need to be referred for PT. Was not mention at last office visit. Please schedule appt

## 2019-03-15 NOTE — Telephone Encounter (Signed)
Pt called stating that she was just seen in the office for her physical. Pt states that she is needing the referral and has not heard back. Please advise.

## 2019-03-24 ENCOUNTER — Ambulatory Visit: Payer: No Typology Code available for payment source | Admitting: Family Medicine

## 2019-03-24 ENCOUNTER — Other Ambulatory Visit: Payer: Self-pay

## 2019-03-24 ENCOUNTER — Encounter: Payer: Self-pay | Admitting: Family Medicine

## 2019-03-24 VITALS — BP 124/94 | HR 92 | Temp 97.6°F | Ht 63.0 in | Wt 200.2 lb

## 2019-03-24 DIAGNOSIS — I10 Essential (primary) hypertension: Secondary | ICD-10-CM | POA: Diagnosis not present

## 2019-03-24 DIAGNOSIS — S39012D Strain of muscle, fascia and tendon of lower back, subsequent encounter: Secondary | ICD-10-CM

## 2019-03-24 DIAGNOSIS — M62838 Other muscle spasm: Secondary | ICD-10-CM | POA: Diagnosis not present
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
## 2019-03-24 NOTE — Patient Instructions (Addendum)
   If you have lab work done today you will be contacted with your lab results within the next 2 weeks.  If you have not heard from us then please contact us. The fastest way to get your results is to register for My Chart.   IF you received an x-ray today, you will receive an invoice from [facility]. Please contact  Radiology at [phone] with questions or concerns regarding your invoice.   IF you received labwork today, you will receive an invoice from [facility]. Please contact [facility] at [phone] with questions or concerns regarding your invoice.   Our billing staff will not be able to assist you with questions regarding bills from these companies.  You will be contacted with the lab results as soon as they are available. The fastest way to get your results is to activate your My Chart account. Instructions are located on the last page of this paperwork. If you have not heard from us regarding the results in 2 weeks, please contact this office.     Managing Your Hypertension Hypertension is commonly called high blood pressure. This is when the force of your blood pressing against the walls of your arteries is too strong. Arteries are blood vessels that carry blood from your heart throughout your body. Hypertension forces the heart to work harder to pump blood, and may cause the arteries to become narrow or stiff. Having untreated or uncontrolled hypertension can cause heart attack, stroke, kidney disease, and other problems. What are blood pressure readings? A blood pressure reading consists of a higher number over a lower number. Ideally, your blood pressure should be below 120/80. The first ("top") number is called the systolic pressure. It is a measure of the pressure in your arteries as your heart beats. The second ("bottom") number is called the diastolic pressure. It is a measure of the pressure in your arteries as the heart relaxes. What does my blood  pressure reading mean? Blood pressure is classified into four stages. Based on your blood pressure reading, your health care provider may use the following stages to determine what type of treatment you need, if any. Systolic pressure and diastolic pressure are measured in a unit called mm Hg. Normal  Systolic pressure: below 120.  Diastolic pressure: below 80. Elevated  Systolic pressure: 120-129.  Diastolic pressure: below 80. Hypertension stage 1  Systolic pressure: 130-139.  Diastolic pressure: 80-89. Hypertension stage 2  Systolic pressure: 140 or above.  Diastolic pressure: 90 or above. What health risks are associated with hypertension? Managing your hypertension is an important responsibility. Uncontrolled hypertension can lead to:  A heart attack.  A stroke.  A weakened blood vessel (aneurysm).  Heart failure.  Kidney damage.  Eye damage.  Metabolic syndrome.  Memory and concentration problems. What changes can I make to manage my hypertension? Hypertension can be managed by making lifestyle changes and possibly by taking medicines. Your health care provider will help you make a plan to bring your blood pressure within a normal range. Eating and drinking   Eat a diet that is high in fiber and potassium, and low in salt (sodium), added sugar, and fat. An example eating plan is called the DASH (Dietary Approaches to Stop Hypertension) diet. To eat this way: ? Eat plenty of fresh fruits and vegetables. Try to fill half of your plate at each meal with fruits and vegetables. ? Eat whole grains, such as whole wheat pasta, brown rice, or whole grain bread. Fill   about one quarter of your plate with whole grains. ? Eat low-fat diary products. ? Avoid fatty cuts of meat, processed or cured meats, and poultry with skin. Fill about one quarter of your plate with lean proteins such as fish, chicken without skin, beans, eggs, and tofu. ? Avoid premade and processed foods.  These tend to be higher in sodium, added sugar, and fat.  Reduce your daily sodium intake. Most people with hypertension should eat less than 1,500 mg of sodium a day.  Limit alcohol intake to no more than 1 drink a day for nonpregnant women and 2 drinks a day for men. One drink equals 12 oz of beer, 5 oz of wine, or 1 oz of hard liquor. Lifestyle  Work with your health care provider to maintain a healthy body weight, or to lose weight. Ask what an ideal weight is for you.  Get at least 30 minutes of exercise that causes your heart to beat faster (aerobic exercise) most days of the week. Activities may include walking, swimming, or biking.  Include exercise to strengthen your muscles (resistance exercise), such as weight lifting, as part of your weekly exercise routine. Try to do these types of exercises for 30 minutes at least 3 days a week.  Do not use any products that contain nicotine or tobacco, such as cigarettes and e-cigarettes. If you need help quitting, ask your health care provider.  Control any long-term (chronic) conditions you have, such as high cholesterol or diabetes. Monitoring  Monitor your blood pressure at home as told by your health care provider. Your personal target blood pressure may vary depending on your medical conditions, your age, and other factors.  Have your blood pressure checked regularly, as often as told by your health care provider. Working with your health care provider  Review all the medicines you take with your health care provider because there may be side effects or interactions.  Talk with your health care provider about your diet, exercise habits, and other lifestyle factors that may be contributing to hypertension.  Visit your health care provider regularly. Your health care provider can help you create and adjust your plan for managing hypertension. Will I need medicine to control my blood pressure? Your health care provider may prescribe  medicine if lifestyle changes are not enough to get your blood pressure under control, and if:  Your systolic blood pressure is 130 or higher.  Your diastolic blood pressure is 80 or higher. Take medicines only as told by your health care provider. Follow the directions carefully. Blood pressure medicines must be taken as prescribed. The medicine does not work as well when you skip doses. Skipping doses also puts you at risk for problems. Contact a health care provider if:  You think you are having a reaction to medicines you have taken.  You have repeated (recurrent) headaches.  You feel dizzy.  You have swelling in your ankles.  You have trouble with your vision. Get help right away if:  You develop a severe headache or confusion.  You have unusual weakness or numbness, or you feel faint.  You have severe pain in your chest or abdomen.  You vomit repeatedly.  You have trouble breathing. Summary  Hypertension is when the force of blood pumping through your arteries is too strong. If this condition is not controlled, it may put you at risk for serious complications.  Your personal target blood pressure may vary depending on your medical conditions, your age, and other   factors. For most people, a normal blood pressure is less than 120/80.  Hypertension is managed by lifestyle changes, medicines, or both. Lifestyle changes include weight loss, eating a healthy, low-sodium diet, exercising more, and limiting alcohol. This information is not intended to replace advice given to you by your health care provider. Make sure you discuss any questions you have with your health care provider. Document Revised: 05/22/2018 Document Reviewed: 12/27/2015 Elsevier Patient Education  2020 [employer]  

## 2019-04-15 ENCOUNTER — Encounter: Payer: Self-pay | Admitting: Physical Therapy

## 2019-04-15 ENCOUNTER — Ambulatory Visit: Payer: No Typology Code available for payment source | Attending: Family Medicine | Admitting: Physical Therapy

## 2019-04-15 ENCOUNTER — Other Ambulatory Visit: Payer: Self-pay

## 2019-04-15 DIAGNOSIS — M5412 Radiculopathy, cervical region: Secondary | ICD-10-CM | POA: Insufficient documentation

## 2019-04-15 DIAGNOSIS — M62838 Other muscle spasm: Secondary | ICD-10-CM | POA: Insufficient documentation

## 2019-04-15 DIAGNOSIS — M5416 Radiculopathy, lumbar region: Secondary | ICD-10-CM
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
## 2019-04-19 ENCOUNTER — Ambulatory Visit: Payer: No Typology Code available for payment source | Admitting: Physical Therapy

## 2019-04-19 ENCOUNTER — Other Ambulatory Visit: Payer: Self-pay

## 2019-04-19 DIAGNOSIS — M5416 Radiculopathy, lumbar region: Secondary | ICD-10-CM

## 2019-04-19 DIAGNOSIS — M62838 Other muscle spasm: Secondary | ICD-10-CM

## 2019-04-19 DIAGNOSIS — M5412 Radiculopathy, cervical region: Secondary | ICD-10-CM | POA: Diagnosis not present

## 2019-04-19 NOTE — Therapy (Signed)
[place], [place], [address] Phone: [phone]   Fax:  [phone]  Physical Therapy Treatment  Patient Details  Name: Belinda Petersen MRN: [id number] Date of Birth: [birth year] Referring Provider (PT): Delia Chimes, MD    Encounter Date: 04/19/2019  PT End of Session - 04/19/19 1629    Visit Number  4    Number of Visits  14    Date for PT Re-Evaluation  05/27/19    Authorization Type  [employer] Golden rule    PT Start Time  [id number]    PT Stop Time  1636    PT Time Calculation (min)  49 min    Activity Tolerance  Patient tolerated treatment well    Behavior During Therapy  [facility] for tasks assessed/performed       Past Medical History:  Diagnosis Date  . Allergy   . COVID-19 virus infection 06/30/2018   has since had multiple negatibe tests   . Fibroids   . Headache(784.0)   . HTN (hypertension)    no meds  . Hyperlipidemia   . Iron deficiency anemia secondary to blood loss (chronic)   . Menorrhagia   . SVD (spontaneous vaginal delivery)    x 1    Past Surgical History:  Procedure Laterality Date  . DILITATION & CURRETTAGE/HYSTROSCOPY WITH VERSAPOINT RESECTION N/A 06/08/2012   Procedure: DILATATION & CURETTAGE/HYSTEROSCOPY WITH VERSAPOINT RESECTION;  Surgeon: Lyman Speller, MD;  Location: [facility];  Service: Gynecology;  Laterality: N/A;  . WISDOM TOOTH EXTRACTION      There were no vitals filed for this visit.  Subjective Assessment - 04/19/19 1554    Subjective  Mild soreness after last tx. but then noted relief for both upper trap and lumbar region. Pain more "nagging" than "hurting".    Currently in Pain?  Yes    Pain Score  2     Pain Location  Neck    Pain Orientation  Left;Right    Pain Descriptors / Indicators  Aching;Tightness    Pain Type  Chronic pain    Pain Radiating Towards  upper trapezius bilat.    Pain Onset  More than a month ago    Pain Frequency  Constant    Aggravating Factors   sitting    Pain Relieving Factors  medication    Effect of Pain on Daily Activities  impacts positional tolerance    Pain Score  6   6-7   Pain Location  Back    Pain Orientation  Right;Lower    Pain Descriptors / Indicators  Aching;Tightness    Pain Type  Chronic pain    Pain Radiating Towards  right posterolateral hip    Pain Onset  More than a month ago    Pain Frequency  Constant    Aggravating Factors   sitting    Pain Relieving Factors  rest    Effect of Pain on Daily Activities  difficulty performing ADLs                       [facility] Adult PT Treatment/Exercise - 04/19/19 0001      Exercises   Exercises  Lumbar      Neck Exercises: Theraband   Shoulder Extension  10 reps    Shoulder Extension Limitations  green band    Rows  10 reps;Green    Horizontal ABduction  10 reps;Green    Horizontal ABduction Limitations  supine  Lumbar Exercises: Stretches   Lower Trunk Rotation  5 reps    Lower Trunk Rotation Limitations  5-10 sce holds emphasis toward left for right lumbar stretch    Piriformis Stretch  Right;2 reps;30 seconds    Other Lumbar Stretch Exercise  supine right dural nerve floss from 90/90 position x 10 reps      Lumbar Exercises: Supine   Pelvic Tilt  10 reps    Clam  10 reps    Clam Limitations  Green band    Bridge  10 reps      Manual Therapy   Joint Mobilization  LAD right hip grade I-III oscillations    Manual Traction  cervical manual traction      Neck Exercises: Stretches   Upper Trapezius Stretch  Right;Left;2 reps;30 seconds   supine manual stretches      Trigger Point Dry Needling - 04/19/19 0001    Consent Given?  Yes    Muscles Treated Head and Neck  Upper trapezius    Muscles Treated Upper Quadrant  Infraspinatus    Muscles Treated Back/Hip  Erector spinae;Lumbar multifidi    Dry Needling Comments  needling in prone with 32 gauge 50 mm needles for bilateral upper trapezius, right  infraspinatus, right lumbar longissimus and multifidi at L4-5 region    Electrical Stimulation Performed with Dry Needling  Yes    E-stim with Dry Needling Details  TENS 20 pps x 10 minutes             PT Short Term Goals - 04/15/19 2103      PT SHORT TERM GOAL #1   Title  Patient will increase right cervical rotation by 20 degrees    Baseline  50 deg at eval, 10 deg today    Time  3    Period  Weeks    Status  On-going    Target Date  05/06/19      PT SHORT TERM GOAL #2   Title  Increase trunk flexion AROM at least 10 deg to improved ability to perform bending motions for chores    Baseline  70 deg    Time  3    Period  Weeks    Status  New    Target Date  05/06/19        PT Long Term Goals - 04/15/19 2104      PT LONG TERM GOAL #1   Title  Patient will sit at her desk for 1 hour without increased pain    Baseline  tolerance still limited    Time  6    Period  Weeks    Status  On-going    Target Date  05/27/19      PT LONG TERM GOAL #2   Title  Pt. to tolerate standing/ambulation for periods at least 30 min for grocery shopping with LBP/right hip pain 2/10 or less    Baseline  5-6/10    Time  6    Period  Weeks    Status  Revised    Target Date  05/27/19      PT LONG TERM GOAL #3   Title  Improve FOTO outcome measure score to 30% or less impairment    Baseline  44% limited    Time  6    Period  Weeks    Status  New    Target Date  05/27/19      PT LONG TERM GOAL #4   Title  RUE  strength grossly 5/5, improve bilat. hip strength grossly 1/2 MMT grade to improve ability for lifting activities and chores    Baseline  right shoulder flexion, abduction and ER 4+/5, see objective for hip    Time  6    Period  Weeks    Status  New    Target Date  05/27/19            Plan - 04/19/19 1630    Clinical Impression Statement  Good reponse to tx. at re-eval last visit-updated HEP for both cervical and lumbar regions with good tolerance and continued with  manual and dry needling. Expect continued progress with further tx.    Personal Factors and Comorbidities  Comorbidity 1    Comorbidities  headaches    Examination-Activity Limitations  Stand;Lift;Reach Overhead;Sit;Dressing    Examination-Participation Restrictions  Cleaning;Community Activity;Personal Finances    Stability/Clinical Decision Making  Evolving/Moderate complexity    Clinical Decision Making  Moderate    Rehab Potential  Good    PT Frequency  2x / week    PT Duration  6 weeks    PT Treatment/Interventions  ADLs/Self Care Home Management;Cryotherapy;Electrical Stimulation;Ultrasound;Functional mobility training;Therapeutic activities;Therapeutic exercise;Patient/family education;Neuromuscular re-education;Manual techniques;Passive range of motion;Splinting;Taping;Spinal Manipulations    PT Next Visit Plan  continue dry needling, STM/manual to cervical region, progress postural exercises with T-band, stretches prn, for lumbar region STM/lumbar mobs, extension bias ROM (better tolerated in standing due neck/shoulder pain), hip LAD, try adding right dural nerve floss/glide, pending tolerance stretch hamstrings    PT Home Exercise Plan  [id number] + standing lumbar extension and upper trap stretches    Consulted and Agree with Plan of Care  Patient       Patient will benefit from skilled therapeutic intervention in order to improve the following deficits and impairments:  Impaired UE functional use, Decreased activity tolerance, Decreased strength, Decreased safety awareness, Decreased range of motion, Pain, Decreased mobility, Decreased endurance, Increased muscle spasms  Visit Diagnosis: Radiculopathy, cervical region  Radiculopathy, lumbar region  Other muscle spasm     Problem List Patient Active Problem List   Diagnosis Date Noted  . Hyperlipidemia 11/10/2018  . History of syncope 11/10/2018  . DOE (dyspnea on exertion) 05/04/2014  . Hypertensive urgency 10/14/2013  .  Chest pain 10/13/2013  . Fibroids, submucosal 06/08/2012  . Menorrhagia 06/08/2012  . Fibroid uterus 04/29/2012  . 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 Essential hypertension, benign 04/29/2012    Beaulah Dinning, PT, DPT 04/19/19 4:33 PM  [facility] [address] [place], [place], [address] Phone: [phone]   Fax:  [phone]  Name: Jenifer Crossett MRN: [id number] Date of Birth: 1965/03/10

## 2019-04-22 ENCOUNTER — Other Ambulatory Visit: Payer: Self-pay

## 2019-04-22 ENCOUNTER — Encounter: Payer: Self-pay | Admitting: Physical Therapy

## 2019-04-22 ENCOUNTER — Ambulatory Visit: Payer: No Typology Code available for payment source | Admitting: Physical Therapy

## 2019-04-22 DIAGNOSIS — M5412 Radiculopathy, cervical region: Secondary | ICD-10-CM | POA: Diagnosis not present

## 2019-04-22 DIAGNOSIS — M62838 Other muscle spasm: Secondary | ICD-10-CM

## 2019-04-22 DIAGNOSIS — M5416 Radiculopathy, lumbar region: Secondary | ICD-10-CM

## 2019-04-22 NOTE — Therapy (Signed)
[place], [place], [address] Phone: [phone]   Fax:  [phone]  Physical Therapy Treatment  Patient Details  Name: Belinda Petersen MRN: [id number] Date of Birth: Nov 15, 1965 Referring Provider (PT): Delia Chimes, MD    Encounter Date: 04/22/2019  PT End of Session - 04/22/19 1739    Visit Number  5    Number of Visits  14    Date for PT Re-Evaluation  05/27/19    Authorization Type  [employer] Golden rule    PT Start Time  1639    PT Stop Time  1745   time spent needling not included in direct minutes   PT Time Calculation (min)  66 min    Activity Tolerance  Patient tolerated treatment well    Behavior During Therapy  [facility] for tasks assessed/performed       Past Medical History:  Diagnosis Date  . Allergy   . COVID-19 virus infection 06/30/2018   has since had multiple negatibe tests   . Fibroids   . Headache(784.0)   . HTN (hypertension)    no meds  . Hyperlipidemia   . Iron deficiency anemia secondary to blood loss (chronic)   . Menorrhagia   . SVD (spontaneous vaginal delivery)    x 1    Past Surgical History:  Procedure Laterality Date  . DILITATION & CURRETTAGE/HYSTROSCOPY WITH VERSAPOINT RESECTION N/A 06/08/2012   Procedure: DILATATION & CURETTAGE/HYSTEROSCOPY WITH VERSAPOINT RESECTION;  Surgeon: Lyman Speller, MD;  Location: [facility];  Service: Gynecology;  Laterality: N/A;  . WISDOM TOOTH EXTRACTION      There were no vitals filed for this visit.  Subjective Assessment - 04/22/19 1735    Subjective  Did well after last tx. with minimal symptoms today for both neck/upper trapezius region as well as low back/RLE.                       [place] Adult PT Treatment/Exercise - 04/22/19 0001      Neck Exercises: Supine   Neck Retraction  15 reps      Manual Therapy   Joint Mobilization  LAD right hip grade I-III oscillations, thoracic PAs grade I-V    Soft tissue  mobilization  seated STM bilateral upper trapezius region    Manual Traction  cervical manual traction and suboccipital release      Neck Exercises: Stretches   Upper Trapezius Stretch  Right;Left;2 reps;30 seconds   supine manual stretches   Levator Stretch  Right;Left;2 reps;30 seconds       Trigger Point Dry Needling - 04/22/19 0001    Consent Given?  Yes    Muscles Treated Head and Neck  Upper trapezius    Muscles Treated Upper Quadrant  Infraspinatus    Muscles Treated Back/Hip  Erector spinae;Lumbar multifidi    Dry Needling Comments  needling in prone with 32 gauge 50 mm needles for bilateral upper trapezius, right infraspinatus, right lumbar longissimus and multifidi at L4-5 region    Electrical Stimulation Performed with Dry Needling  Yes    E-stim with Dry Needling Details  TENS 20 pps x 10 minutes           PT Education - 04/22/19 1739    Education Details  trigger point etiology    Person(s) Educated  Patient    Methods  Explanation    Comprehension  Verbalized understanding       PT Short Term Goals -  04/15/19 2103      PT SHORT TERM GOAL #1   Title  Patient will increase right cervical rotation by 20 degrees    Baseline  50 deg at eval, 10 deg today    Time  3    Period  Weeks    Status  On-going    Target Date  05/06/19      PT SHORT TERM GOAL #2   Title  Increase trunk flexion AROM at least 10 deg to improved ability to perform bending motions for chores    Baseline  70 deg    Time  3    Period  Weeks    Status  New    Target Date  05/06/19        PT Long Term Goals - 04/15/19 2104      PT LONG TERM GOAL #1   Title  Patient will sit at her desk for 1 hour without increased pain    Baseline  tolerance still limited    Time  6    Period  Weeks    Status  On-going    Target Date  05/27/19      PT LONG TERM GOAL #2   Title  Pt. to tolerate standing/ambulation for periods at least 30 min for grocery shopping with LBP/right hip pain 2/10 or  less    Baseline  5-6/10    Time  6    Period  Weeks    Status  Revised    Target Date  05/27/19      PT LONG TERM GOAL #3   Title  Improve FOTO outcome measure score to 30% or less impairment    Baseline  44% limited    Time  6    Period  Weeks    Status  New    Target Date  05/27/19      PT LONG TERM GOAL #4   Title  RUE strength grossly 5/5, improve bilat. hip strength grossly 1/2 MMT grade to improve ability for lifting activities and chores    Baseline  right shoulder flexion, abduction and ER 4+/5, see objective for hip    Time  6    Period  Weeks    Status  New    Target Date  05/27/19            Plan - 04/22/19 1740    Clinical Impression Statement  Pt. continues to respond well to tx. for decreased pain for both neck and lumbar regions with functional gains for positional tolerance.    Personal Factors and Comorbidities  Comorbidity 1    Comorbidities  headaches    Examination-Activity Limitations  Stand;Lift;Reach Overhead;Sit;Dressing    Examination-Participation Restrictions  Cleaning;Community Activity;Personal Finances    Stability/Clinical Decision Making  Evolving/Moderate complexity    Clinical Decision Making  Moderate    Rehab Potential  Good    PT Frequency  2x / week    PT Duration  6 weeks    PT Treatment/Interventions  ADLs/Self Care Home Management;Cryotherapy;Electrical Stimulation;Ultrasound;Functional mobility training;Therapeutic activities;Therapeutic exercise;Patient/family education;Neuromuscular re-education;Manual techniques;Passive range of motion;Splinting;Taping;Spinal Manipulations    PT Next Visit Plan  continue dry needling, STM/manual to cervical region, progress postural exercises with T-band, stretches prn, for lumbar region STM/lumbar mobs, extension bias ROM (better tolerated in standing due neck/shoulder pain), hip LAD, try adding right dural nerve floss/glide, pending tolerance stretch hamstrings    PT Home Exercise Plan   [id number] + standing lumbar extension and upper  trap stretches    Consulted and Agree with Plan of Care  Patient       Patient will benefit from skilled therapeutic intervention in order to improve the following deficits and impairments:  Impaired UE functional use, Decreased activity tolerance, Decreased strength, Decreased safety awareness, Decreased range of motion, Pain, Decreased mobility, Decreased endurance, Increased muscle spasms  Visit Diagnosis: Radiculopathy, cervical region  Radiculopathy, lumbar region  Other muscle spasm     Problem List Patient Active Problem List   Diagnosis Date Noted  . Hyperlipidemia 11/10/2018  . History of syncope 11/10/2018  . DOE (dyspnea on exertion) 05/04/2014  . Hypertensive urgency 10/14/2013  . Chest pain 10/13/2013  . Fibroids, submucosal 06/08/2012  . Menorrhagia 06/08/2012  . Fibroid uterus 04/29/2012  . 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 Essential hypertension, benign 04/29/2012    Beaulah Dinning, PT, DPT 04/22/19 5:57 PM  [facility] [address] [place], [place], [address] Phone: [phone]   Fax:  [phone]  Name: Belinda Petersen MRN: [id number] Date of Birth: [birth year]

## 2019-04-28 ENCOUNTER — Telehealth: Payer: Self-pay | Admitting: Physical Therapy

## 2019-04-28 ENCOUNTER — Ambulatory Visit: Payer: No Typology Code available for payment source | Admitting: Physical Therapy

## 2019-04-28 NOTE — Telephone Encounter (Signed)
Attempted to call regarding missed visit this AM-left voicemail including reminder next appointment day/time.

## 2019-04-30 ENCOUNTER — Ambulatory Visit: Payer: No Typology Code available for payment source | Admitting: Physical Therapy

## 2019-05-03 ENCOUNTER — Encounter: Payer: Self-pay | Admitting: Certified Nurse Midwife

## 2019-05-04 ENCOUNTER — Encounter: Payer: Self-pay | Admitting: Emergency Medicine

## 2019-05-04 ENCOUNTER — Ambulatory Visit: Payer: No Typology Code available for payment source | Admitting: Emergency Medicine

## 2019-05-04 ENCOUNTER — Other Ambulatory Visit: Payer: Self-pay

## 2019-05-04 VITALS — BP 119/79 | HR 87 | Temp 97.8°F | Resp 16 | Ht 63.0 in | Wt 199.0 lb

## 2019-05-04 DIAGNOSIS — L309 Dermatitis, unspecified: Secondary | ICD-10-CM | POA: Diagnosis not present

## 2019-05-04 MED ORDER — TRIAMCINOLONE ACETONIDE 0.1 % EX CREA: 1.0000 "application " | TOPICAL_CREAM | Freq: Two times a day (BID) | CUTANEOUS | 3 refills | Status: DC

## 2019-05-04 NOTE — Patient Instructions (Addendum)
If you have lab work done today you will be contacted with your lab results within the next 2 weeks.  If you have not heard from [place] then please contact us. The fastest way to get your results is to register for My Chart.   IF you received an x-ray today, you will receive an invoice from [facility]. Please contact [facility] at [phone] with questions or concerns regarding your invoice.   IF you received labwork today, you will receive an invoice from [place]. Please contact [facility] at [phone] with questions or concerns regarding your invoice.   Our billing staff will not be able to assist you with questions regarding bills from these companies.  You will be contacted with the lab results as soon as they are available. The fastest way to get your results is to activate your My Chart account. Instructions are located on the last page of this paperwork. If you have not heard from [place] regarding the results in 2 weeks, please contact this office.     Eczema Eczema is a broad term for a group of skin conditions that cause skin to become rough and inflamed. Each type of eczema has different triggers, symptoms, and treatments. Eczema of any type is usually itchy and symptoms range from mild to severe. Eczema and its symptoms are not spread from person to person (are not contagious). It can appear on different parts of the body at different times. Your eczema may not look the same as someone else's eczema. What are the types of eczema? Atopic dermatitis This is a long-term (chronic) skin disease that keeps coming back (recurring). Usual symptoms are dry skin and small, solid pimples that may swell and leak fluid (weep). Contact dermatitis  This happens when something irritates the skin and causes a rash. The irritation can come from substances that you are allergic to (allergens), such as poison ivy, chemicals, or medicines that were applied to your  skin. Dyshidrotic eczema This is a form of eczema on the hands and feet. It shows up as very itchy, fluid-filled blisters. It can affect people of any age, but is more common before age 70. Hand eczema  This causes very itchy areas of skin on the palms and sides of the hands and fingers. This type of eczema is common in industrial jobs where you may be exposed to many different types of irritants. Lichen simplex chronicus This type of eczema occurs when a person constantly scratches one area of the body. Repeated scratching of the area leads to thickened skin (lichenification). Lichen simplex chronicus can occur along with other types of eczema. It is more common in adults, but may be seen in children as well. Nummular eczema This is a common type of eczema. It has no known cause. It typically causes a red, circular, crusty lesion (plaque) that may be itchy. Scratching may become a habit and can cause bleeding. Nummular eczema occurs most often in people of middle-age or older. It most often affects the hands. Seborrheic dermatitis This is a common skin disease that mainly affects the scalp. It may also affect any oily areas of the body, such as the face, sides of nose, eyebrows, ears, eyelids, and chest. It is marked by small scaling and redness of the skin (erythema). This can affect people of all ages. In infants, this condition is known as Chartered certified accountant." Stasis dermatitis This is a common skin disease that usually appears on the legs and feet. It  most often occurs in people who have a condition that prevents blood from being pumped through the veins in the legs (chronic venous insufficiency). Stasis dermatitis is a chronic condition that needs long-term management. How is eczema diagnosed? Your health care provider will examine your skin and review your medical history. He or she may also give you skin patch tests. These tests involve taking patches that contain possible allergens and placing them  on your back. He or she will then check in a few days to see if an allergic reaction occurred. What are the common treatments? Treatment for eczema is based on the type of eczema you have. Hydrocortisone steroid medicine can relieve itching quickly and help reduce inflammation. This medicine may be prescribed or obtained over-the-counter, depending on the strength of the medicine that is needed. Follow these instructions at home:  Take over-the-counter and prescription medicines only as told by your health care provider.  Use creams or ointments to moisturize your skin. Do not use lotions.  Learn what triggers or irritates your symptoms. Avoid these things.  Treat symptom flare-ups quickly.  Do not itch your skin. This can make your rash worse.  Keep all follow-up visits as told by your health care provider. This is important. Where to find more information  The American Academy of Dermatology: http://jones-macias.info/  The National Eczema Association: www.nationaleczema.org Contact a health care provider if:  You have serious itching, even with treatment.  You regularly scratch your skin until it bleeds.  Your rash looks different than usual.  Your skin is painful, swollen, or more red than usual.  You have a fever. Summary  There are eight general types of eczema. Each type has different triggers.  Eczema of any type causes itching that may range from mild to severe.  Treatment varies based on the type of eczema you have. Hydrocortisone steroid medicine can help with itching and inflammation.  Protecting your skin is the best way to prevent eczema. Use moisturizers and lotions. Avoid triggers and irritants, and treat flare-ups quickly. This information is not intended to replace advice given to you by your health care provider. Make sure you discuss any questions you have with your health care provider. Document Revised: 01/10/2017 Document Reviewed: 06/13/2016 Elsevier Patient  Education  [place].

## 2019-05-04 NOTE — Progress Notes (Signed)
Belinda Petersen 54 y.o.   Chief Complaint  Patient presents with  . Rash    x 1 year on hands with itching and redness comes/goes    HISTORY OF PRESENT ILLNESS: This is a 54 y.o. female complaining of dry itchy rash to both hands on and off for the past 1 to 2 years. No other associated symptoms. Past medical history includes hypertension. No new medications.  No history of contact irritants. No pets at home. Had Covid infection last year.  Recently took vaccine.  HPI   Prior to Admission medications   Medication Sig Start Date End Date Taking? Authorizing Provider  albuterol (PROVENTIL HFA;VENTOLIN HFA) 108 (90 Base) MCG/ACT inhaler Inhale 2 puffs into the lungs every 4 (four) hours as needed for wheezing or shortness of breath. 09/04/17  Yes Melynda Ripple, MD  Biotin 1 MG CAPS Take 1 mg by mouth daily.   Yes [provider]  chlorthalidone (HYGROTON) 25 MG tablet Take 1 tablet (25 mg total) by mouth daily. 11/11/18  Yes Imogene Burn, PA-C  cholecalciferol (VITAMIN D) 1000 units tablet Take 1,000 Units by mouth daily.   Yes [provider]  Cinnamon 500 MG capsule Take 500 mg by mouth daily.   Yes [provider]  diclofenac (VOLTAREN) 75 MG EC tablet Take 1 tablet (75 mg total) by mouth 2 (two) times daily. 12/16/18  Yes Hagler, Aaron Edelman, MD  Ferrous Sulfate (IRON) 325 (65 Fe) MG TABS Take 325 mg by mouth daily.   Yes [provider]  fluticasone (FLONASE) 50 MCG/ACT nasal spray Place 2 sprays into both nostrils daily. 09/04/17  Yes Melynda Ripple, MD  Multiple Vitamin (MULTIVITAMIN) tablet Take 1 tablet by mouth daily.   Yes [provider]  Probiotic Product (PROBIOTIC-10 PO) Take 1 tablet by mouth daily.   Yes [provider]  vitamin E 100 UNIT capsule Take by mouth daily.   Yes [provider]    Allergies  Allergen Reactions  . Hctz [Hydrochlorothiazide]     itching  . Amlodipine Besylate Other (See  Comments)    Headaches  . Irbesartan Hives    Pt reports causes "hives."    Patient Active Problem List   Diagnosis Date Noted  . Hyperlipidemia 11/10/2018  . History of syncope 11/10/2018  . Fibroids, submucosal 06/08/2012  . Fibroid uterus 04/29/2012  . 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 Essential hypertension, benign 04/29/2012    Past Medical History:  Diagnosis Date  . Allergy   . COVID-19 virus infection 06/30/2018   has since had multiple negatibe tests   . Fibroids   . Headache(784.0)   . HTN (hypertension)    no meds  . Hyperlipidemia   . Iron deficiency anemia secondary to blood loss (chronic)   . Menorrhagia   . SVD (spontaneous vaginal delivery)    x 1    Past Surgical History:  Procedure Laterality Date  . DILITATION & CURRETTAGE/HYSTROSCOPY WITH VERSAPOINT RESECTION N/A 06/08/2012   Procedure: DILATATION & CURETTAGE/HYSTEROSCOPY WITH VERSAPOINT RESECTION;  Surgeon: Lyman Speller, MD;  Location: [facility];  Service: Gynecology;  Laterality: N/A;  . WISDOM TOOTH EXTRACTION      Social History   Socioeconomic History  . Marital status: Married    Spouse name: Not on file  . Number of children: 1  . Years of education: Not on file  . Highest education level: Not on file  Occupational History    Employer:  [employer]  Tobacco Use  . Smoking status: Never Smoker  . Smokeless tobacco: Never Used  Substance and Sexual Activity  . Alcohol use: Yes    Alcohol/week: 0.0 standard drinks    Comment: 1 glass twice a month  . Drug use: No  . Sexual activity: Yes    Partners: Male    Birth control/protection: None  Other Topics Concern  . Not on file  Social History Narrative  . Not on file   Social Determinants of Health   Financial Resource Strain:   . Difficulty of Paying Living Expenses:   Food Insecurity:   . Worried About Charity fundraiser in the Last Year:   . Arboriculturist in the Last  Year:   Transportation Needs:   . Film/video editor (Medical):   Marland Kitchen Lack of Transportation (Non-Medical):   Physical Activity:   . Days of Exercise per Week:   . Minutes of Exercise per Session:   Stress:   . Feeling of Stress :   Social Connections:   . Frequency of Communication with Friends and Family:   . Frequency of Social Gatherings with Friends and Family:   . Attends Religious Services:   . Active Member of Clubs or Organizations:   . Attends Archivist Meetings:   Marland Kitchen Marital Status:   Intimate Partner Violence:   . Fear of Current or Ex-Partner:   . Emotionally Abused:   Marland Kitchen Physically Abused:   . Sexually Abused:     Family History  Problem Relation Age of Onset  . Diabetes Father   . Hypertension Father   . Lung cancer Father   . Hypertension Mother   . Congestive Heart Failure Mother   . Hypertension Brother   . Hypertension Brother   . Hypertension Brother   . Hypertension Sister   . Heart disease Brother   . Heart attack Brother   . Breast cancer Paternal Grandmother   . Heart disease Maternal Grandmother        pacemaker  . Hypertension Maternal Grandmother   . Lung cancer Paternal Grandfather   . Colon cancer Neg Hx   . Colon polyps Neg Hx   . Esophageal cancer Neg Hx   . Rectal cancer Neg Hx   . Stomach cancer Neg Hx      Review of Systems  Constitutional: Negative.  Negative for chills and fever.  HENT: Negative.  Negative for congestion and sore throat.   Respiratory: Negative.  Negative for cough and shortness of breath.   Cardiovascular: Negative.  Negative for chest pain and palpitations.  Gastrointestinal: Negative.  Negative for abdominal pain, nausea and vomiting.  Genitourinary: Negative.  Negative for dysuria and hematuria.  Musculoskeletal: Negative.  Negative for back pain, myalgias and neck pain.  Skin: Positive for itching and rash.       Both hands  Neurological: Negative for dizziness and headaches.  All other  systems reviewed and are negative.  Today's Vitals   05/04/19 0820  BP: 119/79  Pulse: 87  Resp: 16  Temp: 97.8 F (36.6 C)  TempSrc: Temporal  SpO2: 100%  Weight: 199 lb (90.3 kg)  Height: 5\' 3"  (1.6 m)   Body mass index is 35.25 kg/m.   Physical Exam Vitals reviewed.  Constitutional:      Appearance: Normal appearance.  HENT:     Head: Normocephalic.  Eyes:     Extraocular Movements: Extraocular movements intact.  Pupils: Pupils are equal, round, and reactive to light.  Cardiovascular:     Rate and Rhythm: Normal rate.  Pulmonary:     Effort: Pulmonary effort is normal.  Musculoskeletal:        General: Normal range of motion.     Cervical back: Normal range of motion.  Skin:    General: Skin is warm and dry.     Capillary Refill: Capillary refill takes less than 2 seconds.     Findings: Rash present.     Comments: Positive eczema to both hands right more than left  Neurological:     General: No focal deficit present.     Mental Status: She is alert and oriented to person, place, and time.  Psychiatric:        Mood and Affect: Mood normal.        Behavior: Behavior normal.      ASSESSMENT & PLAN: Belinda Petersen was seen today for rash.  Diagnoses and all orders for this visit:  Eczema of both hands -     triamcinolone cream (KENALOG) 0.1 %; Apply 1 application topically 2 (two) times daily. -     Ambulatory referral to Dermatology    Patient Instructions       If you have lab work done today you will be contacted with your lab results within the next 2 weeks.  If you have not heard from [place] then please contact us. The fastest way to get your results is to register for My Chart.   IF you received an x-ray today, you will receive an invoice from [facility]. Please contact [facility] at [phone] with questions or concerns regarding your invoice.   IF you received labwork today, you will receive an invoice from [place]. Please  contact [facility] at [phone] with questions or concerns regarding your invoice.   Our billing staff will not be able to assist you with questions regarding bills from these companies.  You will be contacted with the lab results as soon as they are available. The fastest way to get your results is to activate your My Chart account. Instructions are located on the last page of this paperwork. If you have not heard from [place] regarding the results in 2 weeks, please contact this office.     Eczema Eczema is a broad term for a group of skin conditions that cause skin to become rough and inflamed. Each type of eczema has different triggers, symptoms, and treatments. Eczema of any type is usually itchy and symptoms range from mild to severe. Eczema and its symptoms are not spread from person to person (are not contagious). It can appear on different parts of the body at different times. Your eczema may not look the same as someone else's eczema. What are the types of eczema? Atopic dermatitis This is a long-term (chronic) skin disease that keeps coming back (recurring). Usual symptoms are dry skin and small, solid pimples that may swell and leak fluid (weep). Contact dermatitis  This happens when something irritates the skin and causes a rash. The irritation can come from substances that you are allergic to (allergens), such as poison ivy, chemicals, or medicines that were applied to your skin. Dyshidrotic eczema This is a form of eczema on the hands and feet. It shows up as very itchy, fluid-filled blisters. It can affect people of any age, but is more common before age 46. Hand eczema  This causes very itchy areas of skin on the palms  and sides of the hands and fingers. This type of eczema is common in industrial jobs where you may be exposed to many different types of irritants. Lichen simplex chronicus This type of eczema occurs when a person constantly scratches one area of the body. Repeated  scratching of the area leads to thickened skin (lichenification). Lichen simplex chronicus can occur along with other types of eczema. It is more common in adults, but may be seen in children as well. Nummular eczema This is a common type of eczema. It has no known cause. It typically causes a red, circular, crusty lesion (plaque) that may be itchy. Scratching may become a habit and can cause bleeding. Nummular eczema occurs most often in people of middle-age or older. It most often affects the hands. Seborrheic dermatitis This is a common skin disease that mainly affects the scalp. It may also affect any oily areas of the body, such as the face, sides of nose, eyebrows, ears, eyelids, and chest. It is marked by small scaling and redness of the skin (erythema). This can affect people of all ages. In infants, this condition is known as Chartered certified accountant." Stasis dermatitis This is a common skin disease that usually appears on the legs and feet. It most often occurs in people who have a condition that prevents blood from being pumped through the veins in the legs (chronic venous insufficiency). Stasis dermatitis is a chronic condition that needs long-term management. How is eczema diagnosed? Your health care provider will examine your skin and review your medical history. He or she may also give you skin patch tests. These tests involve taking patches that contain possible allergens and placing them on your back. He or she will then check in a few days to see if an allergic reaction occurred. What are the common treatments? Treatment for eczema is based on the type of eczema you have. Hydrocortisone steroid medicine can relieve itching quickly and help reduce inflammation. This medicine may be prescribed or obtained over-the-counter, depending on the strength of the medicine that is needed. Follow these instructions at home:  Take over-the-counter and prescription medicines only as told by your health care  provider.  Use creams or ointments to moisturize your skin. Do not use lotions.  Learn what triggers or irritates your symptoms. Avoid these things.  Treat symptom flare-ups quickly.  Do not itch your skin. This can make your rash worse.  Keep all follow-up visits as told by your health care provider. This is important. Where to find more information  The American Academy of Dermatology: http://jones-macias.info/  The National Eczema Association: www.nationaleczema.org Contact a health care provider if:  You have serious itching, even with treatment.  You regularly scratch your skin until it bleeds.  Your rash looks different than usual.  Your skin is painful, swollen, or more red than usual.  You have a fever. Summary  There are eight general types of eczema. Each type has different triggers.  Eczema of any type causes itching that may range from mild to severe.  Treatment varies based on the type of eczema you have. Hydrocortisone steroid medicine can help with itching and inflammation.  Protecting your skin is the best way to prevent eczema. Use moisturizers and lotions. Avoid triggers and irritants, and treat flare-ups quickly. This information is not intended to replace advice given to you by your health care provider. Make sure you discuss any questions you have with your health care provider. Document Revised: 01/10/2017 Document Reviewed: 06/13/2016 Elsevier  Patient Education  [employer].      Agustina Caroli, MD [facility]

## 2019-05-05 ENCOUNTER — Ambulatory Visit: Payer: No Typology Code available for payment source | Admitting: Physical Therapy

## 2019-05-07 ENCOUNTER — Ambulatory Visit: Payer: No Typology Code available for payment source | Admitting: Physical Therapy

## 2019-05-07 ENCOUNTER — Telehealth: Payer: Self-pay | Admitting: Physical Therapy

## 2019-05-07 NOTE — Telephone Encounter (Signed)
Attempted to call patient regarding no show for therapy appointment this AM. Left voicemail to return call if wishing to reschedule.

## 2019-05-10 ENCOUNTER — Encounter: Payer: No Typology Code available for payment source | Admitting: Physical Therapy

## 2019-05-12 ENCOUNTER — Encounter: Payer: No Typology Code available for payment source | Admitting: Physical Therapy

## 2019-06-07 NOTE — Therapy (Signed)
[place], [place], [address] Phone: [phone]   Fax:  [phone]  Physical Therapy Treatment/Discharge  Patient Details  Name: Belinda Petersen MRN: [id number] Date of Birth: 03/09/1965 Referring Provider (PT): Delia Chimes, MD    Encounter Date: 04/22/2019    Past Medical History:  Diagnosis Date  . Allergy   . COVID-19 virus infection 06/30/2018   has since had multiple negatibe tests   . Fibroids   . Headache(784.0)   . HTN (hypertension)    no meds  . Hyperlipidemia   . Iron deficiency anemia secondary to blood loss (chronic)   . Menorrhagia   . SVD (spontaneous vaginal delivery)    x 1    Past Surgical History:  Procedure Laterality Date  . DILITATION & CURRETTAGE/HYSTROSCOPY WITH VERSAPOINT RESECTION N/A 06/08/2012   Procedure: DILATATION & CURETTAGE/HYSTEROSCOPY WITH VERSAPOINT RESECTION;  Surgeon: Lyman Speller, MD;  Location: [facility];  Service: Gynecology;  Laterality: N/A;  . WISDOM TOOTH EXTRACTION      There were no vitals filed for this visit.                              PT Short Term Goals - 04/15/19 2103      PT SHORT TERM GOAL #1   Title  Patient will increase right cervical rotation by 20 degrees    Baseline  50 deg at eval, 10 deg today    Time  3    Period  Weeks    Status  On-going    Target Date  05/06/19      PT SHORT TERM GOAL #2   Title  Increase trunk flexion AROM at least 10 deg to improved ability to perform bending motions for chores    Baseline  70 deg    Time  3    Period  Weeks    Status  New    Target Date  05/06/19        PT Long Term Goals - 04/15/19 2104      PT LONG TERM GOAL #1   Title  Patient will sit at her desk for 1 hour without increased pain    Baseline  tolerance still limited    Time  6    Period  Weeks    Status  On-going    Target Date  05/27/19      PT LONG TERM GOAL #2   Title  Pt. to  tolerate standing/ambulation for periods at least 30 min for grocery shopping with LBP/right hip pain 2/10 or less    Baseline  5-6/10    Time  6    Period  Weeks    Status  Revised    Target Date  05/27/19      PT LONG TERM GOAL #3   Title  Improve FOTO outcome measure score to 30% or less impairment    Baseline  44% limited    Time  6    Period  Weeks    Status  New    Target Date  05/27/19      PT LONG TERM GOAL #4   Title  RUE strength grossly 5/5, improve bilat. hip strength grossly 1/2 MMT grade to improve ability for lifting activities and chores    Baseline  right shoulder flexion, abduction and ER 4+/5, see objective for hip    Time  6  Period  Weeks    Status  New    Target Date  05/27/19              Patient will benefit from skilled therapeutic intervention in order to improve the following deficits and impairments:  Impaired UE functional use, Decreased activity tolerance, Decreased strength, Decreased safety awareness, Decreased range of motion, Pain, Decreased mobility, Decreased endurance, Increased muscle spasms  Visit Diagnosis: Radiculopathy, cervical region  Radiculopathy, lumbar region  Other muscle spasm     Problem List Patient Active Problem List   Diagnosis Date Noted  . Hyperlipidemia 11/10/2018  . History of syncope 11/10/2018  . Fibroids, submucosal 06/08/2012  . Fibroid uterus 04/29/2012  . 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 Essential hypertension, benign 04/29/2012        PHYSICAL THERAPY DISCHARGE SUMMARY  Visits from Start of Care: 5  Current functional level related to goals / functional outcomes: Patient did not return for further therapy after last session 04/22/19.    Remaining deficits: Current status unknown/did not return after 04/22/19 with last 2 therapy sessions missed.   Education / Equipment: NA Plan: Patient agrees to discharge.  Patient goals were partially met.  Patient is being discharged due to not returning since the last visit.  ?????          Beaulah Dinning, PT, DPT 06/07/19 2:40 PM     [facility] [address] [place], [place], [address] Phone: [phone]   Fax:  [phone]  Name: Dezzie Badilla MRN: [id number] Date of Birth: [birth year]

## 2019-06-10 ENCOUNTER — Other Ambulatory Visit: Payer: Self-pay | Admitting: Family Medicine

## 2019-06-10 DIAGNOSIS — Z1231 Encounter for screening mammogram for malignant neoplasm of breast: Secondary | ICD-10-CM

## 2019-06-11 ENCOUNTER — Other Ambulatory Visit: Payer: Self-pay

## 2019-06-11 ENCOUNTER — Ambulatory Visit
Admission: RE | Admit: 2019-06-11 | Discharge: 2019-06-11 | Disposition: A | Discharge status: Home / Self Care | Payer: No Typology Code available for payment source | Source: Ambulatory Visit | Attending: Family Medicine | Admitting: Family Medicine

## 2019-06-11 DIAGNOSIS — Z1231 Encounter for screening mammogram for malignant neoplasm of breast: Secondary | ICD-10-CM

## 2019-06-21 ENCOUNTER — Ambulatory Visit (INDEPENDENT_AMBULATORY_CARE_PROVIDER_SITE_OTHER): Payer: No Typology Code available for payment source | Admitting: Physician Assistant

## 2019-06-21 ENCOUNTER — Other Ambulatory Visit: Payer: Self-pay

## 2019-06-21 ENCOUNTER — Encounter: Payer: Self-pay | Admitting: Physician Assistant

## 2019-06-21 DIAGNOSIS — L301 Dyshidrosis [pompholyx]: Secondary | ICD-10-CM | POA: Diagnosis not present

## 2019-06-21 NOTE — Progress Notes (Signed)
   New Patient Visit  Subjective  Belinda Petersen is a 54 y.o.AA female who presents for the following: New Patient (Initial Visit) (Patient here today for eczema on both hands x 1.5 years. Patient states that her husband did have scabies August 2019 and that's when both of her hands flared up.  Patient sates that her husband's scabies did clear but her hands stayed the same.  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 Patient states that the flares are worse in the summer and winter.  Patient sates that she has dryness, itching, bumps) and Continue (and redness.  PCP gave triamcinolone and that does help. Patient did increase her hand sanitizer use, no change in any lotions or soaps. Per patient her B12 injections do make her itch but no flare ups.). Originally started on the right hand and now is also on the left. Comes on back of hand to sides of fingers. Has bumps and blisters. It itches and burns. Burns when things are applied. It also is red and swells. Triamcinolone helps itch and helps rash to clear. It has flared with shellfish, PMS may flare it and heat flares it.     Objective  Well appearing patient in no apparent distress; mood and affect are within normal limits.  A focused examination was performed including hands. Relevant physical exam findings are noted in the Assessment and Plan.  Objective  Left Hand - Anterior, Right Hand - Anterior: Few inflamed papules dorsum of right hand. Best fit is eczema with or without irritant dermatitis.   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 Left Hand - Anterior; Right Hand - Anterior  Continue Triamcinolone cream PRN. We also discussed potential irritants like hand soap and hand sanitizer. We also discussed her possible shellfish allergy and discussed how it can contribute just by handling the shellfish  and not just eating it. I recommended that she see the allergist again for the potential shellfish allergy as she is a pescaterian and is having throat itching and swelling. I warned her of possible increased reaction and the need to be diagnosed. May be need for epipen and may need ER visit if she has increased throat swelling or trouble breathing.  Call if reflares so we can see it. She hardly had any active rash today.

## 2019-06-23 ENCOUNTER — Ambulatory Visit: Payer: No Typology Code available for payment source | Admitting: Family Medicine

## 2019-06-23 ENCOUNTER — Other Ambulatory Visit: Payer: Self-pay

## 2019-06-23 ENCOUNTER — Encounter: Payer: Self-pay | Admitting: Family Medicine

## 2019-06-23 VITALS — BP 136/82 | HR 98 | Temp 98.0°F | Resp 17 | Ht 63.0 in | Wt 201.0 lb

## 2019-06-23 DIAGNOSIS — Z23 Encounter for immunization: Secondary | ICD-10-CM

## 2019-06-23 DIAGNOSIS — Z1329 Encounter for screening for other suspected endocrine disorder: Secondary | ICD-10-CM

## 2019-06-23 DIAGNOSIS — Z13220 Encounter for screening for lipoid disorders: Secondary | ICD-10-CM

## 2019-06-23 DIAGNOSIS — Z130 Encounter for screening for diseases of the blood and blood-forming organs and certain disorders involving the immune mechanism: Secondary | ICD-10-CM

## 2019-06-23 DIAGNOSIS — I10 Essential (primary) hypertension: Secondary | ICD-10-CM

## 2019-06-23 NOTE — Patient Instructions (Addendum)
Superfoods  Berries blend-  Making a container of strawberries, blueberries, blackberries, raspberries (any mixture of whatever is available) and add in a handful of nuts.  This can be added to cereal or yogurt for a power snack.  These superfoods are full of fiber and the colorful chemicals that fight cancer.   Pairing avocado and olive oil or fresh olives with a salad or added to crackers will decrease absorption of fat from the gut and also increase energy and omega -3 which protects the heart.   Another superfood is to drink ginger tea steeped in hot water.  Ginger tea helps lower blood pressure, decreases indigestion.     If you have lab work done today you will be contacted with your lab results within the next 2 weeks.  If you have not heard from [place] then please contact us. The fastest way to get your results is to register for My Chart.   IF you received an x-ray today, you will receive an invoice from [facility]. Please contact [facility] at [phone] with questions or concerns regarding your invoice.   IF you received labwork today, you will receive an invoice from [facility]. Please contact [facility] at [phone] with questions or concerns regarding your invoice.   Our billing staff will not be able to assist you with questions regarding bills from these companies.  You will be contacted with the lab results as soon as they are available. The fastest way to get your results is to activate your My Chart account. Instructions are located on the last page of this paperwork. If you have not heard from [place] regarding the results in 2 weeks, please contact this office.

## 2019-06-23 NOTE — Progress Notes (Unsigned)
Established Patient Office Visit  Subjective:  Patient ID: Belinda Petersen, female    DOB: 27-Jul-1965  Age: 54 y.o. MRN: [id number]  CC:  Chief Complaint  Patient presents with  . Hypertension    3 month f/u    HPI Belinda Petersen presents for   Hypertension: Patient here for follow-up of elevated blood pressure. She is exercising and is adherent to low salt diet.  Blood pressure is well controlled at home. Cardiac symptoms none. Patient denies chest pressure/discomfort, dyspnea, exertional chest pressure/discomfort and irregular heart beat.  Cardiovascular risk factors: hypertension and obesity (BMI >= 30 kg/m2). Use of agents associated with hypertension: none. History of target organ damage: none. BP Readings from Last 3 Encounters:  06/23/19 136/82  05/04/19 119/79  03/24/19 (!) 124/94    Obesity She is in school and has a lot going on and is not sleeping well since she has to study and write papers. She states that she is very busy and tries to eat a healthy diet. Wt Readings from Last 3 Encounters:  06/23/19 201 lb (91.2 kg)  05/04/19 199 lb (90.3 kg)  03/24/19 200 lb 3.2 oz (90.8 kg)     Past Medical History:  Diagnosis Date  . Allergy   . COVID-19 virus infection 06/30/2018   has since had multiple negatibe tests   . Fibroids   . Headache(784.0)   . HTN (hypertension)    no meds  . Hyperlipidemia   . Iron deficiency anemia secondary to blood loss (chronic)   . Menorrhagia   . SVD (spontaneous vaginal delivery)    x 1    Past Surgical History:  Procedure Laterality Date  . DILITATION & CURRETTAGE/HYSTROSCOPY WITH VERSAPOINT RESECTION N/A 06/08/2012   Procedure: DILATATION & CURETTAGE/HYSTEROSCOPY WITH VERSAPOINT RESECTION;  Surgeon: Lyman Speller, MD;  Location: [facility];  Service: Gynecology;  Laterality: N/A;  . WISDOM TOOTH EXTRACTION      Family History  Problem Relation Age of Onset  . Diabetes Father   . Hypertension Father   . Lung  cancer Father   . Hypertension Mother   . Congestive Heart Failure Mother   . Hypertension Brother   . Hypertension Brother   . Hypertension Brother   . Hypertension Sister   . Heart disease Brother   . Heart attack Brother   . Breast cancer Paternal Grandmother   . Heart disease Maternal Grandmother        pacemaker  . Hypertension Maternal Grandmother   . Lung cancer Paternal Grandfather   . Colon cancer Neg Hx   . Colon polyps Neg Hx   . Esophageal cancer Neg Hx   . Rectal cancer Neg Hx   . Stomach cancer Neg Hx     Social History   Socioeconomic History  . Marital status: Married    Spouse name: Not on file  . Number of children: 1  . Years of education: Not on file  . Highest education level: Not on file  Occupational History    Employer: [employer]  Tobacco Use  . Smoking status: Never Smoker  . Smokeless tobacco: Never Used  Substance and Sexual Activity  . Alcohol use: Yes    Alcohol/week: 0.0 standard drinks    Comment: 1 glass twice a month  . Drug use: No  . Sexual activity: Yes    Partners: Male    Birth control/protection: None  Other Topics Concern  . Not on file  Social History Narrative  .  Not on file   Social Determinants of Health   Financial Resource Strain:   . Difficulty of Paying Living Expenses:   Food Insecurity:   . Worried About Charity fundraiser in the Last Year:   . Arboriculturist in the Last Year:   Transportation Needs:   . Film/video editor (Medical):   Marland Kitchen Lack of Transportation (Non-Medical):   Physical Activity:   . Days of Exercise per Week:   . Minutes of Exercise per Session:   Stress:   . Feeling of Stress :   Social Connections:   . Frequency of Communication with Friends and Family:   . Frequency of Social Gatherings with Friends and Family:   . Attends Religious Services:   . Active Member of Clubs or Organizations:   . Attends Archivist Meetings:   Marland Kitchen Marital Status:     Intimate Partner Violence:   . Fear of Current or Ex-Partner:   . Emotionally Abused:   Marland Kitchen Physically Abused:   . Sexually Abused:     Outpatient Medications Prior to Visit  Medication Sig Dispense Refill  . albuterol (PROVENTIL HFA;VENTOLIN HFA) 108 (90 Base) MCG/ACT inhaler Inhale 2 puffs into the lungs every 4 (four) hours as needed for wheezing or shortness of breath. 1 Inhaler 0  . Biotin 1 MG CAPS Take 1 mg by mouth daily.    . chlorthalidone (HYGROTON) 25 MG tablet Take 1 tablet (25 mg total) by mouth daily. 90 tablet 3  . cholecalciferol (VITAMIN D) 1000 units tablet Take 1,000 Units by mouth daily.    . Cinnamon 500 MG capsule Take 500 mg by mouth daily.    . diclofenac (VOLTAREN) 75 MG EC tablet Take 1 tablet (75 mg total) by mouth 2 (two) times daily. 14 tablet 0  . Ferrous Sulfate (IRON) 325 (65 Fe) MG TABS Take 325 mg by mouth daily.    . fluticasone (FLONASE) 50 MCG/ACT nasal spray Place 2 sprays into both nostrils daily. 16 g 0  . Multiple Vitamin (MULTIVITAMIN) tablet Take 1 tablet by mouth daily.    . Probiotic Product (PROBIOTIC-10 PO) Take 1 tablet by mouth daily.    Marland Kitchen triamcinolone cream (KENALOG) 0.1 % Apply 1 application topically 2 (two) times daily. 30 g 3  . vitamin E 100 UNIT capsule Take by mouth daily.     No facility-administered medications prior to visit.    Allergies  Allergen Reactions  . Hctz [Hydrochlorothiazide]     itching  . Amlodipine Besylate Other (See Comments)    Headaches  . Irbesartan Hives    Pt reports causes "hives."    ROS Review of Systems Review of Systems  Constitutional: Negative for activity change, appetite change, chills and fever.  HENT: Negative for congestion, nosebleeds, trouble swallowing and voice change.   Respiratory: Negative for cough, shortness of breath and wheezing.   Gastrointestinal: Negative for diarrhea, nausea and vomiting.  Genitourinary: Negative for difficulty urinating, dysuria, flank pain and  hematuria.  Musculoskeletal: Negative for back pain, joint swelling and neck pain.  Neurological: Negative for dizziness, speech difficulty, light-headedness and numbness.  See HPI. All other review of systems negative.     Objective:    Physical Exam  BP 136/82 (BP Location: Left Arm, Patient Position: Sitting, Cuff Size: Large)   Pulse 98   Temp 98 F (36.7 C) (Temporal)   Resp 17   Ht 5\' 3"  (1.6 m)   Wt 201 lb (91.2  kg)   LMP 06/02/2019   SpO2 98%   BMI 35.61 kg/m  Wt Readings from Last 3 Encounters:  06/23/19 201 lb (91.2 kg)  05/04/19 199 lb (90.3 kg)  03/24/19 200 lb 3.2 oz (90.8 kg)   Physical Exam  Constitutional: Oriented to person, place, and time. Appears well-developed and well-nourished.  HENT:  Head: Normocephalic and atraumatic.  Eyes: Conjunctivae and EOM are normal.  Cardiovascular: Normal rate, regular rhythm, normal heart sounds and intact distal pulses.  No murmur heard. Pulmonary/Chest: Effort normal and breath sounds normal. No stridor. No respiratory distress. Has no wheezes.  Neurological: Is alert and oriented to person, place, and time.  Skin: Skin is warm. Capillary refill takes less than 2 seconds.  Psychiatric: Has a normal mood and affect. Behavior is normal. Judgment and thought content normal.    Health Maintenance Due  Topic Date Due  . COVID-19 Vaccine (1) Never done    There are no preventive care reminders to display for this patient.  Lab Results  Component Value Date   TSH 1.370 10/16/2016   Lab Results  Component Value Date   WBC 3.4 01/29/2019   HGB 11.4 01/29/2019   HCT 35.0 01/29/2019   MCV 84 01/29/2019   PLT 260 01/29/2019   Lab Results  Component Value Date   NA 137 01/29/2019   K 3.8 01/29/2019   CO2 26 01/29/2019   GLUCOSE 86 01/29/2019   BUN 17 01/29/2019   CREATININE 1.07 (H) 01/29/2019   BILITOT 0.4 10/14/2013   ALKPHOS 80 10/14/2013   AST 14 10/14/2013   ALT 11 10/14/2013   PROT 7.8 10/14/2013    ALBUMIN 3.2 (L) 10/14/2013   CALCIUM 9.7 01/29/2019   ANIONGAP 3 (L) 05/02/2014   Lab Results  Component Value Date   CHOL 216 (H) 01/29/2019   Lab Results  Component Value Date   HDL 69 01/29/2019   Lab Results  Component Value Date   LDLCALC 136 (H) 01/29/2019   Lab Results  Component Value Date   TRIG 63 01/29/2019   Lab Results  Component Value Date   CHOLHDL 3.1 01/29/2019   No results found for: HGBA1C    Assessment & Plan:   Problem List Items Addressed This Visit      Cardiovascular and Mediastinum   Essential hypertension, benign  - Patient's blood pressure is at goal of 139/89 or less. Condition is stable. Continue current medications and treatment plan. I recommend that you exercise for 30-45 minutes 5 days a week. I also recommend a balanced diet with fruits and vegetables every day, lean meats, and little fried foods. The DASH diet (you can find this online) is a good example of this.     Other Visit Diagnoses    Screening for cholesterol level    -  Primary   Relevant Orders   Lipid panel   Screening for endocrine/metabolic/immunity disorders       Relevant Orders   Comprehensive metabolic panel      No orders of the defined types were placed in this encounter.   Follow-up: No follow-ups on file.    Forrest Moron, MD

## 2019-06-24 LAB — COMPREHENSIVE METABOLIC PANEL
ALT: 22 IU/L (ref 0–32)
AST: 25 IU/L (ref 0–40)
Albumin/Globulin Ratio: 2.1 (ref 1.2–2.2)
Albumin: 4.8 g/dL (ref 3.8–4.9)
Alkaline Phosphatase: 74 IU/L (ref 39–117)
BUN/Creatinine Ratio: 11 (ref 9–23)
BUN: 14 mg/dL (ref 6–24)
Bilirubin Total: 0.9 mg/dL (ref 0.0–1.2)
CO2: 24 mmol/L (ref 20–29)
Calcium: 9.9 mg/dL (ref 8.7–10.2)
Chloride: 102 mmol/L (ref 96–106)
Creatinine, Ser: 1.3 mg/dL — ABNORMAL HIGH (ref 0.57–1.00)
GFR calc Af Amer: 55 mL/min/{1.73_m2} — ABNORMAL LOW (ref >59)
GFR calc non Af Amer: 47 mL/min/{1.73_m2} — ABNORMAL LOW (ref >59)
Globulin, Total: 2.3 g/dL (ref 1.5–4.5)
Glucose: 91 mg/dL (ref 65–99)
Potassium: 3.7 mmol/L (ref 3.5–5.2)
Sodium: 143 mmol/L (ref 134–144)
Total Protein: 7.1 g/dL (ref 6.0–8.5)

## 2019-06-24 LAB — LIPID PANEL
Chol/HDL Ratio: 3.1 ratio (ref 0.0–4.4)
Cholesterol, Total: 183 mg/dL (ref 100–199)
HDL: 59 mg/dL (ref >39)
LDL Chol Calc (NIH): 107 mg/dL — ABNORMAL HIGH (ref 0–99)
Triglycerides: 95 mg/dL (ref 0–149)
VLDL Cholesterol Cal: 17 mg/dL (ref 5–40)

## 2019-09-27 ENCOUNTER — Encounter (HOSPITAL_COMMUNITY): Payer: Self-pay | Admitting: Emergency Medicine

## 2019-09-27 ENCOUNTER — Ambulatory Visit (HOSPITAL_COMMUNITY)
Admission: EM | Admit: 2019-09-27 | Discharge: 2019-09-27 | Disposition: A | Discharge status: Home / Self Care | Payer: No Typology Code available for payment source | Attending: Family Medicine | Admitting: Family Medicine

## 2019-09-27 ENCOUNTER — Other Ambulatory Visit: Payer: Self-pay

## 2019-09-27 DIAGNOSIS — J029 Acute pharyngitis, unspecified: Secondary | ICD-10-CM | POA: Diagnosis present

## 2019-09-27 DIAGNOSIS — Z8616 Personal history of COVID-19: Secondary | ICD-10-CM | POA: Diagnosis not present

## 2019-09-27 DIAGNOSIS — R05 Cough: Secondary | ICD-10-CM | POA: Insufficient documentation

## 2019-09-27 DIAGNOSIS — E785 Hyperlipidemia, unspecified: Secondary | ICD-10-CM | POA: Diagnosis not present

## 2019-09-27 DIAGNOSIS — J069 Acute upper respiratory infection, unspecified: Secondary | ICD-10-CM | POA: Diagnosis not present

## 2019-09-27 DIAGNOSIS — Z20822 Contact with and (suspected) exposure to covid-19: Secondary | ICD-10-CM | POA: Insufficient documentation

## 2019-09-27 DIAGNOSIS — I10 Essential (primary) hypertension: Secondary | ICD-10-CM | POA: Diagnosis not present

## 2019-09-27 LAB — SARS CORONAVIRUS 2 (TAT 6-24 HRS): SARS Coronavirus 2: NEGATIVE

## 2019-09-27 MED ORDER — HYDROCODONE-HOMATROPINE 5-1.5 MG/5ML PO SYRP: 5.0000 mL | ORAL_SOLUTION | Freq: Four times a day (QID) | ORAL | 0 refills | Status: DC | PRN

## 2019-09-27 NOTE — Discharge Instructions (Addendum)
You have been tested for COVID-19 today. If your test returns positive, you will receive a phone call from [facility] regarding your results. Negative test results are not called. Both positive and negative results area always visible on MyChart. If you do not have a MyChart account, sign up instructions are provided in your discharge papers. Please do not hesitate to contact us should you have questions or concerns.  Be aware, your cough medication may cause drowsiness. Please do not drive, operate heavy machinery or make important decisions while on this medication, it can cloud your judgement.  

## 2019-09-27 NOTE — ED Triage Notes (Addendum)
onset Friday morning.  Coughing, sob, phlegm, nasal pressure and sore throat.  Chest soreness particularly with cough.  Patient has yellow phlegm  No fever  Patient has had covid vaccine

## 2019-09-27 NOTE — ED Provider Notes (Signed)
[place]   [id number] 09/27/19 Arrival Time: 9937  ASSESSMENT & PLAN:  1. Viral URI with cough     Discussed nature of viral illnesses. Symptom care. COVID-19 testing sent. See letter/work note on file for self-isolation guidelines.  Meds ordered this encounter  Medications  . HYDROcodone-homatropine (HYCODAN) 5-1.5 MG/5ML syrup    Sig: Take 5 mLs by mouth every 6 (six) hours as needed for cough.    Dispense:  90 mL    Refill:  0    OTC symptom care as needed.   Follow-up Information    Forrest Moron, MD.   Specialty: Internal Medicine Why: As needed. Contact information: [address] [place] [address] [phone]              [place] Controlled Substances Registry consulted for this patient. I feel the risk/benefit ratio today is favorable for proceeding with this prescription for a controlled substance. Medication sedation precautions given.  Reviewed expectations re: course of current medical issues. Questions answered. Outlined signs and symptoms indicating need for more acute intervention. Understanding verbalized. After Visit Summary given.   SUBJECTIVE: History from: patient. Belinda Petersen is a 54 y.o. female who reports coughing, nasal congestion/sinus pressure, sore throat for a 3-4 days. Known COVID-19 contact: none known. She has completed vaccine. Recent travel: none. Denies: difficulty breathing. Normal PO intake without n/v/d.    OBJECTIVE:  Vitals:   09/27/19 0901  BP: (!) 135/93  Pulse: 94  Resp: 20  Temp: 98.5 F (36.9 C)  TempSrc: Oral  SpO2: 100%    General appearance: alert; no distress Eyes: PERRLA; EOMI; conjunctiva normal HENT: Yoe; AT; nasal mucosa normal; oral mucosa normal Neck: supple  Lungs: speaks full sentences without difficulty; unlabored Extremities: no edema Skin: warm and dry Neurologic: normal gait Psychological: alert and cooperative; normal mood and  affect  Labs:  Labs Reviewed  SARS CORONAVIRUS 2 (TAT 6-24 HRS)      Allergies  Allergen Reactions  . Hctz [Hydrochlorothiazide]     itching  . Amlodipine Besylate Other (See Comments)    Headaches  . Irbesartan Hives    Pt reports causes "hives."    Past Medical History:  Diagnosis Date  . Allergy   . COVID-19 virus infection 06/30/2018   has since had multiple negatibe tests   . Fibroids   . Headache(784.0)   . HTN (hypertension)    no meds  . Hyperlipidemia   . Iron deficiency anemia secondary to blood loss (chronic)   . Menorrhagia   . SVD (spontaneous vaginal delivery)    x 1   Social History   Socioeconomic History  . Marital status: Married    Spouse name: Not on file  . Number of children: 1  . Years of education: Not on file  . Highest education level: Not on file  Occupational History    Employer: [employer]  Tobacco Use  . Smoking status: Never Smoker  . Smokeless tobacco: Never Used  Vaping Use  . Vaping Use: Never used  Substance and Sexual Activity  . Alcohol use: Yes    Alcohol/week: 0.0 standard drinks    Comment: 1 glass twice a month  . Drug use: No  . Sexual activity: Yes    Partners: Male    Birth control/protection: None  Other Topics Concern  . Not on file  Social History Narrative  . Not on file   Social Determinants of Health  Financial Resource Strain:   . Difficulty of Paying Living Expenses:   Food Insecurity:   . Worried About Charity fundraiser in the Last Year:   . Arboriculturist in the Last Year:   Transportation Needs:   . Film/video editor (Medical):   Marland Kitchen Lack of Transportation (Non-Medical):   Physical Activity:   . Days of Exercise per Week:   . Minutes of Exercise per Session:   Stress:   . Feeling of Stress :   Social Connections:   . Frequency of Communication with Friends and Family:   . Frequency of Social Gatherings with Friends and Family:   . Attends Religious Services:    . Active Member of Clubs or Organizations:   . Attends Archivist Meetings:   Marland Kitchen Marital Status:   Intimate Partner Violence:   . Fear of Current or Ex-Partner:   . Emotionally Abused:   Marland Kitchen Physically Abused:   . Sexually Abused:    Family History  Problem Relation Age of Onset  . Diabetes Father   . Hypertension Father   . Lung cancer Father   . Hypertension Mother   . Congestive Heart Failure Mother   . Hypertension Brother   . Hypertension Brother   . Hypertension Brother   . Hypertension Sister   . Heart disease Brother   . Heart attack Brother   . Breast cancer Paternal Grandmother   . Heart disease Maternal Grandmother        pacemaker  . Hypertension Maternal Grandmother   . Lung cancer Paternal Grandfather   . Colon cancer Neg Hx   . Colon polyps Neg Hx   . Esophageal cancer Neg Hx   . Rectal cancer Neg Hx   . Stomach cancer Neg Hx    Past Surgical History:  Procedure Laterality Date  . DILITATION & CURRETTAGE/HYSTROSCOPY WITH VERSAPOINT RESECTION N/A 06/08/2012   Procedure: DILATATION & CURETTAGE/HYSTEROSCOPY WITH VERSAPOINT RESECTION;  Surgeon: Lyman Speller, MD;  Location: [facility];  Service: Gynecology;  Laterality: N/A;  . WISDOM TOOTH EXTRACTION       Vanessa Kick, MD 09/29/19 1004

## 2019-09-27 NOTE — ED Notes (Signed)
Obtained covid swab, labeled, liquid in tube and secured lid

## 2019-09-28 ENCOUNTER — Ambulatory Visit
Admission: RE | Admit: 2019-09-28 | Discharge: 2019-09-28 | Disposition: A | Discharge status: Home / Self Care | Payer: No Typology Code available for payment source | Source: Ambulatory Visit | Attending: Family Medicine | Admitting: Family Medicine

## 2019-09-28 ENCOUNTER — Other Ambulatory Visit: Payer: Self-pay | Admitting: Family Medicine

## 2019-09-28 DIAGNOSIS — R062 Wheezing: Secondary | ICD-10-CM

## 2019-12-21 ENCOUNTER — Other Ambulatory Visit: Payer: Self-pay | Admitting: Physician Assistant

## 2020-01-15 ENCOUNTER — Other Ambulatory Visit: Payer: Self-pay | Admitting: Cardiology

## 2020-02-01 ENCOUNTER — Encounter: Payer: No Typology Code available for payment source | Admitting: Family Medicine

## 2020-02-02 ENCOUNTER — Other Ambulatory Visit: Payer: Self-pay | Admitting: Cardiology

## 2020-02-13 ENCOUNTER — Other Ambulatory Visit: Payer: Self-pay | Admitting: Cardiology

## 2020-06-14 ENCOUNTER — Other Ambulatory Visit: Payer: Self-pay

## 2020-06-14 ENCOUNTER — Other Ambulatory Visit: Payer: Self-pay | Admitting: Family Medicine

## 2020-06-14 ENCOUNTER — Ambulatory Visit
Admission: RE | Admit: 2020-06-14 | Discharge: 2020-06-14 | Disposition: A | Discharge status: Home / Self Care | Payer: No Typology Code available for payment source | Source: Ambulatory Visit | Attending: Family Medicine | Admitting: Family Medicine

## 2020-06-14 DIAGNOSIS — M25512 Pain in left shoulder: Secondary | ICD-10-CM

## 2020-08-04 ENCOUNTER — Other Ambulatory Visit: Payer: Self-pay | Admitting: Family Medicine

## 2020-08-04 DIAGNOSIS — Z1231 Encounter for screening mammogram for malignant neoplasm of breast: Secondary | ICD-10-CM

## 2020-09-23 ENCOUNTER — Other Ambulatory Visit: Payer: Self-pay | Admitting: Emergency Medicine

## 2020-09-23 DIAGNOSIS — L309 Dermatitis, unspecified: Secondary | ICD-10-CM

## 2020-09-27 ENCOUNTER — Ambulatory Visit: Payer: Managed Care, Other (non HMO)

## 2020-09-29 ENCOUNTER — Telehealth: Payer: Self-pay | Admitting: Cardiology

## 2020-09-29 NOTE — Telephone Encounter (Signed)
STAT if patient feels like he/she is going to faint   Are you dizzy now? A little   Do you feel faint or have you passed out? No   Do you have any other symptoms? No  Have you checked your HR and BP (record if available)? No

## 2020-09-29 NOTE — Telephone Encounter (Signed)
Pt calling in to make an appt in the next week, for complaints of dizziness, mild nausea, and equilibrium off with position changes. Pt states she started getting dizzy and room spinning on Wednesday night.  She states this did make her mildly nauseated.  Pt states on Thursday morning, she felt very "wobbly." Pt states she is feeling better today, but still at times gets dizzy when changing positions to quickly. Pt states she has no chest pain, doe, sob, palpitations, diaphoresis, vomiting, pre-syncopal or syncopal episodes.  She states her vision is very good. She has no weakness.  She states she is on BP meds, but has not been monitoring this. Pt is a former Dr. Meda Coffee pt, and to establish with Dr. Johney Frame, but hasn't been seen by her yet. Scheduled the pt to go to our [facility] location on Monday 8/22 at 2:30 pm to see Laurann Montana NP.  Advised her to arrive 15 mins prior to this appt.  Pt aware of address and where to park at our [facility] office. Advised the pt to increase her hydration.  Advised her to change positions slowly to allow good circulation and avoid orthostasis.  Advised her to go ahead and start monitoring her HR/BP and logging this information, and take this to her appt on 8/22, for [facility] to review.  Also advised her to wear compressions during the day. ED precautions provided to the pt if symptoms were to worsen or persist in the meantime between now and her appt on Monday. Pt verbalized understanding and agrees with this plan. Will route this communication to Dr. Jacolyn Reedy in-basket, for covering Cardiologist to review.

## 2020-10-02 ENCOUNTER — Other Ambulatory Visit: Payer: Self-pay

## 2020-10-02 ENCOUNTER — Encounter (HOSPITAL_BASED_OUTPATIENT_CLINIC_OR_DEPARTMENT_OTHER): Payer: Self-pay | Admitting: Family

## 2020-10-02 ENCOUNTER — Ambulatory Visit (HOSPITAL_BASED_OUTPATIENT_CLINIC_OR_DEPARTMENT_OTHER): Payer: Managed Care, Other (non HMO) | Admitting: Family

## 2020-10-02 VITALS — BP 150/102 | HR 79 | Ht 63.0 in | Wt 216.3 lb

## 2020-10-02 DIAGNOSIS — I10 Essential (primary) hypertension: Secondary | ICD-10-CM | POA: Diagnosis not present

## 2020-10-02 DIAGNOSIS — R42 Dizziness and giddiness: Secondary | ICD-10-CM

## 2020-10-02 DIAGNOSIS — E782 Mixed hyperlipidemia: Secondary | ICD-10-CM

## 2020-10-02 MED ORDER — MECLIZINE HCL 12.5 MG PO TABS: 12.5000 mg | ORAL_TABLET | Freq: Three times a day (TID) | ORAL | 0 refills | Status: AC | PRN

## 2020-10-02 MED ORDER — CARVEDILOL 12.5 MG PO TABS: 12.5000 mg | ORAL_TABLET | Freq: Two times a day (BID) | ORAL | 2 refills | Status: DC

## 2020-10-02 NOTE — Progress Notes (Signed)
Office Visit    Patient Name: Belinda Petersen Date of Encounter: 10/02/2020  PCP:  Forrest Moron, MD   [place]  Cardiologist:  Freada Bergeron, MD  Advanced Practice Provider:  No care team member to display Electrophysiologist:  None     Chief Complaint    Belinda Petersen is a 55 y.o. 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 presents today for dizziness   Past Medical History    Past Medical History:  Diagnosis Date   Allergy    COVID-19 virus infection 06/30/2018   has since had multiple negatibe tests    Fibroids    Headache(784.0)    HTN (hypertension)    no meds   Hyperlipidemia    Iron deficiency anemia secondary to blood loss (chronic)    Menorrhagia    SVD (spontaneous vaginal delivery)    x 1   Past Surgical History:  Procedure Laterality Date   DILITATION & CURRETTAGE/HYSTROSCOPY WITH VERSAPOINT RESECTION N/A 06/08/2012   Procedure: DILATATION & CURETTAGE/HYSTEROSCOPY WITH VERSAPOINT RESECTION;  Surgeon: Lyman Speller, MD;  Location: [facility];  Service: Gynecology;  Laterality: N/A;   WISDOM TOOTH EXTRACTION      Allergies  Allergies  Allergen Reactions   Hctz [Hydrochlorothiazide]     itching   Amlodipine Besylate Other (See Comments)    Headaches   Irbesartan Hives    Pt reports causes "hives."    History of Present Illness    Belinda Petersen is a 55 y.o. 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 last seen 11/11/2018 by Estella Husk, PA.  She called the office 09/29/2020 noting dizziness, mild nausea, equilibrium being off with certain position changes.  Presents today for  follow up.  Reports onset of dizziness 8-17/20 2 in the evening.  Felt as if the room was spinning while laying in bed.  Also felt as if her equilibrium was off.  She stayed off from work and persistent symptoms were noted Thursday and Friday.  Sunday her symptoms started to taper off.  Today she feels much improved.  While she was symptomatic she did endorse nausea as well as headache.  She also felt fatigued.  No near syncope nor syncope.  Described as a spinning sensation but not a lightheadedness. Blood pressure at home as high as 160/114.  Does note her weight is up which she attributes to stress and often precipitates her high blood pressure.  Recently got a promotion at work which is exciting but also stressful.  Tells me 1 month ago propanolol twice daily was started for elevated blood pressure.  EKGs/Labs/Other Studies Reviewed:   The following studies were reviewed today:  Echo 2018  - Left ventricle: The cavity size was normal. Wall thickness was   normal. Systolic function was normal. The estimated ejection   fraction was in the range of 55% to 60%. Wall motion was normal;   there were no regional wall motion abnormalities. Left   ventricular diastolic function parameters were normal.   Impressions:  - Normal LV systolic and diastolic function; no significant   valvular disease.     NST 2015: 1. No reversible  ischemia or infarction.   2. Normal left ventricular wall motion.   3. Left ventricular ejection fraction 61%   4. Low-risk stress test findings with a fixed defect in the apical anterolateral wall consistent with breast attenuation artifact. There was no ischemia. There was abnormal uptake of radiotracer in both forearms. Clniical correlation recommended.   *2012 Appropriate Use Criteria for Coronary Revascularization Focused Update: J Am Coll Cardiol. 1950;93(2):671-245. http://content.airportbarriers.com.aspx?articleid=1201161     Electronically Signed    By: Fransico Him   On: 10/14/2013 15:37  EKG:  EKG is ordered today.  The ekg ordered today demonstrates NSR 79 bpm with no acute ST/T wave changes.   Recent Labs: No results found for requested labs within last 8760 hours.  Recent Lipid Panel    Component Value Date/Time   CHOL 183 06/23/2019 0959   TRIG 95 06/23/2019 0959   HDL 59 06/23/2019 0959   CHOLHDL 3.1 06/23/2019 0959   LDLCALC 107 (H) 06/23/2019 0959   Home Medications   Current Meds  Medication Sig   albuterol (PROVENTIL HFA;VENTOLIN HFA) 108 (90 Base) MCG/ACT inhaler Inhale 2 puffs into the lungs every 4 (four) hours as needed for wheezing or shortness of breath.   Biotin 1 MG CAPS Take 1 mg by mouth daily.   chlorthalidone (HYGROTON) 25 MG tablet TAKE 1 TABLETBY MOUTH DAILY. PLEASE MAKE OVERDUE APPT WITH DR. Meda Coffee BEFORE ANYMORE REFILLS. THANK YOU 2ND ATTEMPT   cholecalciferol (VITAMIN D) 1000 units tablet Take 1,000 Units by mouth daily.   Ferrous Sulfate (IRON) 325 (65 Fe) MG TABS Take 325 mg by mouth daily.   fluticasone (FLONASE) 50 MCG/ACT nasal spray Place 2 sprays into both nostrils daily.   Multiple Vitamin (MULTIVITAMIN) tablet Take 1 tablet by mouth daily.   propranolol (INDERAL) 20 MG tablet Take 20 mg by mouth 2 (two) times daily.   triamcinolone cream (KENALOG) 0.1 % APPLY TO AFFECTED AREA TWICE A DAY     Review of Systems      All other systems reviewed and are otherwise negative except as noted above.  Physical Exam    VS:  BP (!) 150/102   Pulse 79   Ht '5\' 3"'  (1.6 m)   Wt 216 lb 4.8 oz (98.1 kg)   BMI 38.32 kg/m  , BMI Body mass index is 38.32 kg/m.  Wt Readings from Last 3 Encounters:  10/02/20 216 lb 4.8 oz (98.1 kg)  06/23/19 201 lb (91.2 kg)  05/04/19 199 lb (90.3 kg)     GEN: Well nourished, well developed, in no acute distress. HEENT: normal. Neck: Supple, no JVD, carotid bruits, or masses. Cardiac: RRR, no murmurs, rubs, or gallops. No clubbing, cyanosis, edema.  Radials/PT  2+ and equal bilaterally.  Respiratory:  Respirations regular and unlabored, clear to auscultation bilaterally. GI: Soft, nontender, nondistended. MS: No deformity or atrophy. Skin: Warm and dry, no rash. Neuro:  Strength and sensation are intact. Psych: Normal affect.  Assessment & Plan    Hypertension -BP not at goal.  Stop propanolol.  Start carvedilol 12.5 mg twice daily.  Continue chlorthalidone 25 mg daily.  Noted previous intolerance to irbesartan with hives and amlodipine with headache.  Home monitoring of blood pressure encouraged.  Blood pressure goal less than 130/80.  Future considerations include increased dose of carvedilol versus addition of hydralazine/Imdur.  Hesitant to use ACE/ARB due to previous hives.   HLD -not on statin at this time.  C-Met, lipid panel ordered.  Dizziness/vertigo -1 week history  of dizziness associated with room spinning.  Likely etiology of vertigo.  Rx as needed meclizine.  Recommend CMP, magnesium, CBC, TSH to rule out alternate etiology.  If she has recurrent episodes consider referral to ENT.  Syncope -previous episode in the setting of emesis felt to be vasovagal.  No recurrence.  Disposition: Follow up in 1 month(s) with Dr. Johney Frame or APP.  Signed, Loel Dubonnet, NP 10/02/2020, 2:54 PM [place]

## 2020-10-02 NOTE — Patient Instructions (Addendum)
Medication Instructions:  Your physician has recommended you make the following change in your medication:   START Meclizine as needed for dizziness and vertigo *if you do not have improvement in symptoms after 1 tablet you may take a 2nd tablet after 15 minutes  STOP Propranolol  START Carvedilol (Coreg) one 12.86m tablet twice daily   *If you need a refill on your cardiac medications before your next appointment, please call your pharmacy*   Lab Work: Your physician recommends that you return for lab work this week for [facility]: CMP, magnesium, CBC, TSH, lipid panel  If you have labs (blood work) drawn today and your tests are completely normal, you will receive your results only by: MBantam(if you have MyChart) OR A paper copy in the mail If you have any lab test that is abnormal or we need to change your treatment, we will call you to review the results.   Testing/Procedures: Your EKG today showed normal sinus rhythm which is a good result   Follow-Up: [facility] you and your health needs are our priority.  As part of our continuing mission to provide you with exceptional heart care, we have   We recommend signing up for the patient portal called "MyChart".  Sign up information is provided on this After Visit Summary.  MyChart is used to connect with patients for Virtual Visits (Telemedicine).  Patients are able to view lab/test results, encounter notes, upcoming appointments, etc.  Non-urgent messages can be sent to your provider as well.   To learn more about what you can do with MyChart, go to [email or web address]    Your next appointment:   1 month(s) Monday, September 26 @ 8:00 am.   The format for your next appointment:   In Person  Provider:   CLoel Dubonnet NP    Other Instructions   Vertigo Vertigo is the feeling that you or the things around you are moving when they are not. This feeling can come and go at any time. Vertigo often goes  away on its own. This condition can be dangerous if it happens when you are doingactivities like driving or working with machines. Your doctor will do tests to find the cause of your vertigo. These tests willalso help your doctor decide on the best treatment for you. Follow these instructions at home: Eating and drinking     Drink enough fluid to keep your pee (urine) pale yellow. Do not drink alcohol. Activity Return to your normal activities when your doctor says that it is safe. In the morning, first sit up on the side of the bed. When you feel okay, stand slowly while you hold onto something until you know that your balance is fine. Move slowly. Avoid sudden body or head movements or certain positions, as told by your doctor. Use a cane if you have trouble standing or walking. Sit down right away if you feel dizzy. Avoid doing any tasks or activities that can cause danger to you or others if you get dizzy. Avoid bending down if you feel dizzy. Place items in your home so that they are easy for you to reach without bending or leaning over. Do not drive or use machinery if you feel dizzy. General instructions Take over-the-counter and prescription medicines only as told by your doctor. Keep all follow-up visits. Contact a doctor if: Your medicine does not help your vertigo. Your proble  Recommend c-Met, CBCms get worse or you have new symptoms. You have  a fever. You feel like you may vomit (nauseous), or this feeling gets worse. You start to vomit. Your family or friends see changes in how you act. You lose feeling (have numbness) in part of your body. You feel prickling and tingling in a part of your body. Get help right away if: You are always dizzy. You faint. You get very bad headaches. You get a stiff neck. Bright light starts to bother you. You have trouble moving or talking. You feel weak in your hands, arms, or legs. You have changes in your hearing or in how you see  (vision). These symptoms may be an emergency. Get help right away. Call your local emergency services (911 in the [place] Do not wait to see if the symptoms will go away. Do not drive yourself to the hospital. Summary Vertigo is the feeling that you or the things around you are moving when they are not. Your doctor will do tests to find the cause of your vertigo. You may be told to avoid some tasks, positions, or movements. Contact a doctor if your medicine is not helping, or if you have a fever, new symptoms, or a change in how you act. Get help right away if you get very bad headaches, or if you have changes in how you speak, hear, or see. This information is not intended to replace advice given to you by your health care provider. Make sure you discuss any questions you have with your healthcare provider. Document Revised: 12/29/2019 Document Reviewed: 12/29/2019 Elsevier Patient Education  2022 [employer].

## 2020-11-05 NOTE — Progress Notes (Deleted)
Office Visit    Patient Name: Belinda Petersen Date of Encounter: 11/05/2020  PCP:  Forrest Moron, MD   [place]  Cardiologist:  Freada Bergeron, MD  Advanced Practice Provider:  No care team member to display Electrophysiologist:  None     Chief Complaint    Belinda Petersen is a 55 y.o. 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 presents today for dizziness   Past Medical History    Past Medical History:  Diagnosis Date   Allergy    COVID-19 virus infection 06/30/2018   has since had multiple negatibe tests    Fibroids    Headache(784.0)    HTN (hypertension)    no meds   Hyperlipidemia    Iron deficiency anemia secondary to blood loss (chronic)    Menorrhagia    SVD (spontaneous vaginal delivery)    x 1   Past Surgical History:  Procedure Laterality Date   DILITATION & CURRETTAGE/HYSTROSCOPY WITH VERSAPOINT RESECTION N/A 06/08/2012   Procedure: DILATATION & CURETTAGE/HYSTEROSCOPY WITH VERSAPOINT RESECTION;  Surgeon: Lyman Speller, MD;  Location: [facility];  Service: Gynecology;  Laterality: N/A;   WISDOM TOOTH EXTRACTION      Allergies  Allergies  Allergen Reactions   Hctz [Hydrochlorothiazide]     itching   Amlodipine Besylate Other (See Comments)    Headaches   Irbesartan Hives    Pt reports causes "hives."    History of Present Illness    Belinda Petersen is a 55 y.o. 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 last seen 10/02/20.   She called the office 09/29/2020 noting dizziness, mild nausea, equilibrium being off with certain position changes. She was seen in clinic 10/02/20 with 4 day history  of dizziness with sensation of room spinning while laying down. Consistent with vertigo and PRN Meclizine prescribed. Her BP was not at goal and Propranolol was transitioned to Propranolol.  She presents today for follow up. ***  EKGs/Labs/Other Studies Reviewed:   The following studies were reviewed today:  Echo 2018  - Left ventricle: The cavity size was normal. Wall thickness was   normal. Systolic function was normal. The estimated ejection   fraction was in the range of 55% to 60%. Wall motion was normal;   there were no regional wall motion abnormalities. Left   ventricular diastolic function parameters were normal.   Impressions:  - Normal LV systolic and diastolic function; no significant   valvular disease.     NST 2015: 1. No reversible ischemia or infarction.   2. Normal left ventricular wall motion.   3. Left ventricular ejection fraction 61%   4. Low-risk stress test findings with a fixed defect in the apical anterolateral wall consistent with breast attenuation artifact. There was no ischemia. There was abnormal uptake of radiotracer in both forearms. Clniical correlation recommended.   *2012 Appropriate Use Criteria for Coronary Revascularization Focused Update: J Am Coll Cardiol. 8592;76(3):943-200. http://content.airportbarriers.com.aspx?articleid=1201161     Electronically Signed   By: Fransico Him   On: 10/14/2013 15:37  EKG:  No EKG is ordered today.  The ekg independently reviewed from 10/02/20 demonstrated NSR 79 bpm with  no acute ST/T wave changes.   Recent Labs: No results found for requested labs within last 8760 hours.  Recent Lipid Panel    Component Value Date/Time   CHOL 183 06/23/2019 0959   TRIG 95 06/23/2019 0959   HDL 59 06/23/2019 0959   CHOLHDL 3.1 06/23/2019 0959   LDLCALC 107 (H) 06/23/2019 0959   Home Medications   No outpatient medications have been marked as taking for the 11/06/20 encounter (Appointment) with Loel Dubonnet, NP.     Review of Systems      All other systems reviewed and are otherwise negative except as noted above.  Physical Exam    VS:  There were no vitals taken for this visit. , BMI There is no height or weight on file to calculate BMI.  Wt Readings from Last 3 Encounters:  10/02/20 216 lb 4.8 oz (98.1 kg)  06/23/19 201 lb (91.2 kg)  05/04/19 199 lb (90.3 kg)    *** GEN: Well nourished, well developed, in no acute distress. HEENT: normal. Neck: Supple, no JVD, carotid bruits, or masses. Cardiac: RRR, no murmurs, rubs, or gallops. No clubbing, cyanosis, edema.  Radials/PT 2+ and equal bilaterally.  Respiratory:  Respirations regular and unlabored, clear to auscultation bilaterally. GI: Soft, nontender, nondistended. MS: No deformity or atrophy. Skin: Warm and dry, no rash. Neuro:  Strength and sensation are intact. Psych: Normal affect.  Assessment & Plan    Hypertension - Since starting Carvedilol, BP ***. Continue chlorthalidone 25 mg daily.  Noted previous intolerance to irbesartan with hives and amlodipine with headache.  Home monitoring of blood pressure encouraged.  Blood pressure goal less than 130/80.  Future considerations include increased dose of carvedilol versus addition of hydralazine/Imdur.  Hesitant to use ACE/ARB due to previous hives. ***  HLD -not on statin at this time.  C-Met, lipid panel ordered.***  Dizziness/vertigo -1 week history of dizziness associated with room spinning.  Likely etiology of vertigo.  Rx as needed meclizine.  Recommend CMP, magnesium, CBC, TSH to rule out alternate etiology.  If she has recurrent episodes consider referral to ENT.***  Syncope -previous episode in the setting of emesis felt to be vasovagal.  No recurrence.***  Disposition: Follow up in 1 month(s) ***with Dr. Johney Frame or APP.  Signed, Loel Dubonnet, NP 11/05/2020, 5:01 PM [place]

## 2020-11-06 ENCOUNTER — Ambulatory Visit (HOSPITAL_BASED_OUTPATIENT_CLINIC_OR_DEPARTMENT_OTHER): Payer: Managed Care, Other (non HMO) | Admitting: Family

## 2020-12-20 ENCOUNTER — Other Ambulatory Visit (HOSPITAL_BASED_OUTPATIENT_CLINIC_OR_DEPARTMENT_OTHER): Payer: Self-pay | Admitting: Family

## 2020-12-20 DIAGNOSIS — I10 Essential (primary) hypertension: Secondary | ICD-10-CM

## 2020-12-20 MED ORDER — CHLORTHALIDONE 25 MG PO TABS: ORAL_TABLET | ORAL | 3 refills | Status: AC

## 2021-04-05 ENCOUNTER — Other Ambulatory Visit (HOSPITAL_BASED_OUTPATIENT_CLINIC_OR_DEPARTMENT_OTHER): Payer: Self-pay

## 2021-04-05 ENCOUNTER — Telehealth: Payer: Managed Care, Other (non HMO) | Admitting: Nurse Practitioner

## 2021-04-05 DIAGNOSIS — U071 COVID-19: Secondary | ICD-10-CM | POA: Diagnosis not present

## 2021-04-05 MED ORDER — MOLNUPIRAVIR EUA 200MG CAPSULE: 4.0000 | ORAL_CAPSULE | Freq: Two times a day (BID) | ORAL | 0 refills | Status: AC

## 2021-04-05 MED ORDER — CARVEDILOL 12.5 MG PO TABS: 12.5000 mg | ORAL_TABLET | Freq: Two times a day (BID) | ORAL | 3 refills | Status: DC

## 2021-04-05 NOTE — Progress Notes (Signed)
Virtual Visit Consent   Belinda Petersen, you are scheduled for a virtual visit with Belinda Petersen, [place], a [facility] provider, today.     Just as with appointments in the office, your consent must be obtained to participate.  Your consent will be active for this visit and any virtual visit you may have with one of our providers in the next 365 days.     If you have a MyChart account, a copy of this consent can be sent to you electronically.  All virtual visits are billed to your insurance company just like a traditional visit in the office.    As this is a virtual visit, video technology does not allow for your provider to perform a traditional examination.  This may limit your provider's ability to fully assess your condition.  If your provider identifies any concerns that need to be evaluated in person or the need to arrange testing (such as labs, EKG, etc.), we will make arrangements to do so.     Although advances in technology are sophisticated, we cannot ensure that it will always work on either your end or our end.  If the connection with a video visit is poor, the visit may have to be switched to a telephone visit.  With either a video or telephone visit, we are not always able to ensure that we have a secure connection.     I need to obtain your verbal consent now.   Are you willing to proceed with your visit today? YES   Belinda Petersen has provided verbal consent on 04/05/2021 for a virtual visit (video or telephone).   Belinda Hassell Done, FNP   Date: 04/05/2021 12:12 PM   Virtual Visit via Video Note   I, Belinda Petersen, connected with Belinda Petersen ([id number], 1965/04/27) on 04/05/21 at 12:15 PM EST by a video-enabled telemedicine application and verified that I am speaking with the correct person using two identifiers.  Location: Patient: Virtual Visit Location Patient: Home Provider: Virtual Visit Location Provider: Mobile   I discussed the  limitations of evaluation and management by telemedicine and the availability of in person appointments. The patient expressed understanding and agreed to proceed.    History of Present Illness: Belinda Petersen is a 56 y.o. who identifies as a female who was assigned female at birth, and is being seen today for covid positive.  HPI: URI  This is a new problem. The current episode started in the past 7 days (Sunday on way home from [place]). The problem has been gradually worsening. The maximum temperature recorded prior to her arrival was 101 - 101.9 F. The fever has been present for 1 to 2 days. Associated symptoms include congestion, coughing, headaches, rhinorrhea, sneezing and a sore throat. She has tried decongestant for the symptoms. The treatment provided mild relief.   Review of Systems  HENT:  Positive for congestion, rhinorrhea, sneezing and sore throat.   Respiratory:  Positive for cough.   Neurological:  Positive for headaches.   Problems:  Patient Active Problem List   Diagnosis Date Noted   Hyperlipidemia 11/10/2018   History of syncope 11/10/2018   Fibroids, submucosal 06/08/2012   Fibroid uterus 04/29/2012   Iron deficiency anemia secondary to blood loss (chronic) 04/29/2012   Essential hypertension, benign 04/29/2012    Allergies:  Allergies  Allergen Reactions   Hctz [Hydrochlorothiazide]     itching   Amlodipine Besylate Other (See Comments)    Headaches   Irbesartan Hives  Pt reports causes "hives."   Medications:  Current Outpatient Medications:    albuterol (PROVENTIL HFA;VENTOLIN HFA) 108 (90 Base) MCG/ACT inhaler, Inhale 2 puffs into the lungs every 4 (four) hours as needed for wheezing or shortness of breath., Disp: 1 Inhaler, Rfl: 0   Biotin 1 MG CAPS, Take 1 mg by mouth daily., Disp: , Rfl:    carvedilol (COREG) 12.5 MG tablet, Take 1 tablet (12.5 mg total) by mouth 2 (two) times daily., Disp: 60 tablet, Rfl: 2   chlorthalidone (HYGROTON) 25 MG  tablet, TAKE 1 TABLET BY MOUTH EVERY DAY IN THE MORNING WITH FOOD FOR 30 DAYS, Disp: 90 tablet, Rfl: 3   cholecalciferol (VITAMIN D) 1000 units tablet, Take 1,000 Units by mouth daily., Disp: , Rfl:    Cinnamon 500 MG capsule, Take 500 mg by mouth daily. (Patient not taking: Reported on 10/02/2020), Disp: , Rfl:    Ferrous Sulfate (IRON) 325 (65 Fe) MG TABS, Take 325 mg by mouth daily., Disp: , Rfl:    fluticasone (FLONASE) 50 MCG/ACT nasal spray, Place 2 sprays into both nostrils daily., Disp: 16 g, Rfl: 0   meclizine (ANTIVERT) 12.5 MG tablet, Take 1 tablet (12.5 mg total) by mouth 3 (three) times daily as needed for dizziness., Disp: 30 tablet, Rfl: 0   Multiple Vitamin (MULTIVITAMIN) tablet, Take 1 tablet by mouth daily., Disp: , Rfl:    triamcinolone cream (KENALOG) 0.1 %, APPLY TO AFFECTED AREA TWICE A DAY, Disp: 30 g, Rfl: 3  Observations/Objective: Patient is well-developed, well-nourished in no acute distress.  Resting comfortably  at home.  Head is normocephalic, atraumatic.  No labored breathing. Speech is clear and coherent with logical content.  Patient is alert and oriented at baseline.  Raspy voice Wet cough   Assessment and Plan:  Belinda Petersen in today with chief complaint of Covid Positive   1. Positive self-administered antigen test for COVID-19 1. Take meds as prescribed 2. Use a cool mist humidifier especially during the winter months and when heat has been humid. 3. Use saline nose sprays frequently 4. Saline irrigations of the nose can be very helpful if Petersen frequently.  * 4X daily for 1 week*  * Use of a nettie pot can be helpful with this. Follow directions with this* 5. Drink plenty of fluids 6. Keep thermostat turn down low 7.For any cough or congestion- delsym or mucinex  8. For fever or aces or pains- take tylenol or ibuprofen appropriate for age and weight.  * for fevers greater than 101 orally you may alternate ibuprofen and tylenol every  3  hours.   Meds ordered this encounter  Medications   molnupiravir EUA (LAGEVRIO) 200 mg CAPS capsule    Sig: Take 4 capsules (800 mg total) by mouth 2 (two) times daily for 5 days.    Dispense:  40 capsule    Refill:  0    Order Specific Question:   Supervising Provider    Answer:   Noemi Chapel [[id number]]      Follow Up Instructions: I discussed the assessment and treatment plan with the patient. The patient was provided an opportunity to ask questions and all were answered. The patient agreed with the plan and demonstrated an understanding of the instructions.  A copy of instructions were sent to the patient via MyChart.  The patient was advised to call back or seek an in-person evaluation if the symptoms worsen or if the condition fails to improve as anticipated.  Time:  I spent 8 minutes with the patient via telehealth technology discussing the above problems/concerns.    Belinda Hassell Done, FNP

## 2021-04-05 NOTE — Patient Instructions (Signed)
1. Take meds as prescribed 2. Use a cool mist humidifier especially during the winter months and when heat has been humid. 3. Use saline nose sprays frequently 4. Saline irrigations of the nose can be very helpful if done frequently.  * 4X daily for 1 week*  * Use of a nettie pot can be helpful with this. Follow directions with this* 5. Drink plenty of fluids 6. Keep thermostat turn down low 7.For any cough or congestion- mucinex or delsym 8. For fever or aces or pains- take tylenol or ibuprofen appropriate for age and weight.  * for fevers greater than 101 orally you may alternate ibuprofen and tylenol every  3 hours.    

## 2021-06-18 ENCOUNTER — Encounter (HOSPITAL_COMMUNITY): Payer: Self-pay | Admitting: Emergency Medicine

## 2021-06-18 ENCOUNTER — Emergency Department (HOSPITAL_COMMUNITY): Payer: Managed Care, Other (non HMO)

## 2021-06-18 ENCOUNTER — Other Ambulatory Visit: Payer: Self-pay

## 2021-06-18 ENCOUNTER — Emergency Department (HOSPITAL_COMMUNITY)
Admission: EM | Admit: 2021-06-18 | Discharge: 2021-06-18 | Disposition: A | Discharge status: Home / Self Care | Payer: Managed Care, Other (non HMO) | Attending: Emergency Medicine | Admitting: Emergency Medicine

## 2021-06-18 DIAGNOSIS — R55 Syncope and collapse: Secondary | ICD-10-CM | POA: Insufficient documentation

## 2021-06-18 DIAGNOSIS — I10 Essential (primary) hypertension: Secondary | ICD-10-CM | POA: Insufficient documentation

## 2021-06-18 DIAGNOSIS — R519 Headache, unspecified: Secondary | ICD-10-CM | POA: Diagnosis present

## 2021-06-18 LAB — COMPREHENSIVE METABOLIC PANEL
ALT: 15 U/L (ref 0–44)
AST: 18 U/L (ref 15–41)
Albumin: 3.5 g/dL (ref 3.5–5.0)
Alkaline Phosphatase: 58 U/L (ref 38–126)
Anion gap: 7 (ref 5–15)
BUN: 13 mg/dL (ref 6–20)
CO2: 30 mmol/L (ref 22–32)
Calcium: 9.4 mg/dL (ref 8.9–10.3)
Chloride: 100 mmol/L (ref 98–111)
Creatinine, Ser: 1.14 mg/dL — ABNORMAL HIGH (ref 0.44–1.00)
GFR, Estimated: 57 mL/min — ABNORMAL LOW (ref >60)
Glucose, Bld: 75 mg/dL (ref 70–99)
Potassium: 3 mmol/L — ABNORMAL LOW (ref 3.5–5.1)
Sodium: 137 mmol/L (ref 135–145)
Total Bilirubin: 0.8 mg/dL (ref 0.3–1.2)
Total Protein: 7.5 g/dL (ref 6.5–8.1)

## 2021-06-18 LAB — CBC WITH DIFFERENTIAL/PLATELET
Abs Immature Granulocytes: 0.02 10*3/uL (ref 0.00–0.07)
Basophils Absolute: 0 10*3/uL (ref 0.0–0.1)
Basophils Relative: 0 %
Eosinophils Absolute: 0 10*3/uL (ref 0.0–0.5)
Eosinophils Relative: 1 %
HCT: 33.3 % — ABNORMAL LOW (ref 36.0–46.0)
Hemoglobin: 11.2 g/dL — ABNORMAL LOW (ref 12.0–15.0)
Immature Granulocytes: 1 %
Lymphocytes Relative: 29 %
Lymphs Abs: 1.1 10*3/uL (ref 0.7–4.0)
MCH: 28.1 pg (ref 26.0–34.0)
MCHC: 33.6 g/dL (ref 30.0–36.0)
MCV: 83.7 fL (ref 80.0–100.0)
Monocytes Absolute: 0.5 10*3/uL (ref 0.1–1.0)
Monocytes Relative: 12 %
Neutro Abs: 2.2 10*3/uL (ref 1.7–7.7)
Neutrophils Relative %: 57 %
Platelets: 225 10*3/uL (ref 150–400)
RBC: 3.98 MIL/uL (ref 3.87–5.11)
RDW: 14.3 % (ref 11.5–15.5)
WBC: 3.8 10*3/uL — ABNORMAL LOW (ref 4.0–10.5)
nRBC: 0 % (ref 0.0–0.2)

## 2021-06-18 LAB — TROPONIN I (HIGH SENSITIVITY): Troponin I (High Sensitivity): 3 ng/L (ref <18)

## 2021-06-18 LAB — URINALYSIS, ROUTINE W REFLEX MICROSCOPIC
Bilirubin Urine: NEGATIVE
Glucose, UA: NEGATIVE mg/dL
Hgb urine dipstick: NEGATIVE
Ketones, ur: NEGATIVE mg/dL
Leukocytes,Ua: NEGATIVE
Nitrite: NEGATIVE
Protein, ur: NEGATIVE mg/dL
Specific Gravity, Urine: 1.006 (ref 1.005–1.030)
pH: 6 (ref 5.0–8.0)

## 2021-06-18 LAB — LIPASE, BLOOD: Lipase: 39 U/L (ref 11–51)

## 2021-06-18 LAB — I-STAT BETA HCG BLOOD, ED (MC, WL, AP ONLY): I-stat hCG, quantitative: <5 m[IU]/mL (ref <5)

## 2021-06-18 LAB — CBG MONITORING, ED: Glucose-Capillary: 91 mg/dL (ref 70–99)

## 2021-06-18 MED ORDER — DIPHENHYDRAMINE HCL 50 MG/ML IJ SOLN
12.5000 mg | Freq: Once | INTRAMUSCULAR | Status: AC
Start: 2021-06-18 — End: 2021-06-18
  Administered 2021-06-18: 12.5 mg via INTRAVENOUS
  Filled 2021-06-18: qty 1

## 2021-06-18 MED ORDER — SODIUM CHLORIDE 0.9 % IV BOLUS
1000.0000 mL | Freq: Once | INTRAVENOUS | Status: AC
  Administered 2021-06-18: 1000 mL via INTRAVENOUS

## 2021-06-18 MED ORDER — PROCHLORPERAZINE EDISYLATE 10 MG/2ML IJ SOLN
10.0000 mg | Freq: Once | INTRAMUSCULAR | Status: AC
  Administered 2021-06-18: 10 mg via INTRAVENOUS
  Filled 2021-06-18: qty 2

## 2021-06-18 NOTE — ED Notes (Signed)
Patient transported to CT 

## 2021-06-18 NOTE — ED Provider Triage Note (Signed)
Emergency Medicine Provider Triage Evaluation Note ? ?Belinda Petersen , a 56 y.o. female  was evaluated in triage.  Pt complains of near syncopal episode.  Reports that today while at work and standing she began to feel lightheaded.  Patient reports that she sat down and feeling of lightheadedness continued to get worse.  Patient states that while sitting she also started having some epigastric abdominal pain.  Gradually feeling of lightheadedness and abdominal pain subsided.  Patient denies any associated chest pain, shortness of breath, or sudden onset of headache.  Patient did not lose consciousness. ? ?Review of Systems  ?Positive: Nausea, epigastric abdominal pain, lightheadedness ?Negative: Chest pain, shortness of breath, syncope, palpitations, vomiting, ? ?Physical Exam  ?BP 120/84 (BP Location: Right Arm)   Pulse 66   Temp 98 ?F (36.7 ?C) (Oral)   Resp 18   Ht '5\' 3"'$  (1.6 m)   Wt 98 kg   SpO2 100%   BMI 38.27 kg/m?  ?Gen:   Awake, no distress   ?Resp:  Normal effort  ?MSK:   Moves extremities without difficulty  ?Other:  Abdomen soft, nondistended, nontender with no guarding or rebound tenderness.  +2 radial pulse bilaterally. ? ?Medical Decision Making  ?Medically screening exam initiated at 1:32 PM.  Appropriate orders placed.  Belinda Petersen was informed that the remainder of the evaluation will be completed by another provider, this initial triage assessment does not replace that evaluation, and the importance of remaining in the ED until their evaluation is complete. ? ? ?  ?Belinda Beckwith, PA-C ?06/18/21 1333 ? ?

## 2021-06-18 NOTE — ED Provider Notes (Signed)
?[facility] ?Provider Note ? ? ?CSN: [id number] ?Arrival date & time: 06/18/21  1311 ? ?  ? ?History ? ?Chief Complaint  ?Patient presents with  ? Near Syncope  ? ? ?Belinda Petersen is a 56 y.o. female. ? ?The history is provided by the patient.  ?Near Syncope ?This is a new problem. The current episode started 3 to 5 hours ago. The problem has been resolved. Associated symptoms include headaches. Pertinent negatives include no chest pain, no abdominal pain and no shortness of breath. Nothing aggravates the symptoms. Nothing relieves the symptoms. She has tried rest for the symptoms. The treatment provided no relief.  ? ?  ? ?Home Medications ?Prior to Admission medications   ?Medication Sig Start Date End Date Taking? Authorizing Provider  ?albuterol (PROVENTIL HFA;VENTOLIN HFA) 108 (90 Base) MCG/ACT inhaler Inhale 2 puffs into the lungs every 4 (four) hours as needed for wheezing or shortness of breath. 09/04/17   Melynda Ripple, MD  ?Biotin 1 MG CAPS Take 1 mg by mouth daily.    [provider]  ?carvedilol (COREG) 12.5 MG tablet Take 1 tablet (12.5 mg total) by mouth 2 (two) times daily. 04/05/21 07/04/21  Loel Dubonnet, NP  ?chlorthalidone (HYGROTON) 25 MG tablet TAKE 1 TABLET BY MOUTH EVERY DAY IN THE MORNING WITH FOOD FOR 30 DAYS 12/20/20   Loel Dubonnet, NP  ?cholecalciferol (VITAMIN D) 1000 units tablet Take 1,000 Units by mouth daily.    [provider]  ?Cinnamon 500 MG capsule Take 500 mg by mouth daily. ?Patient not taking: Reported on 10/02/2020    [provider]  ?Ferrous Sulfate (IRON) 325 (65 Fe) MG TABS Take 325 mg by mouth daily.    [provider]  ?fluticasone (FLONASE) 50 MCG/ACT nasal spray Place 2 sprays into both nostrils daily. 09/04/17   Melynda Ripple, MD  ?meclizine (ANTIVERT) 12.5 MG tablet Take 1 tablet (12.5 mg total) by mouth 3 (three) times daily as needed for dizziness. 10/02/20   Loel Dubonnet,  NP  ?Multiple Vitamin (MULTIVITAMIN) tablet Take 1 tablet by mouth daily.    [provider]  ?triamcinolone cream (KENALOG) 0.1 % APPLY TO AFFECTED AREA TWICE A DAY 09/24/20   Horald Pollen, MD  ?   ? ?Allergies    ?Hctz [hydrochlorothiazide], Amlodipine besylate, and Irbesartan   ? ?Review of Systems   ?Review of Systems  ?Respiratory:  Negative for shortness of breath.   ?Cardiovascular:  Positive for near-syncope. Negative for chest pain.  ?Gastrointestinal:  Negative for abdominal pain.  ?Neurological:  Positive for headaches.  ? ?Physical Exam ?Updated Vital Signs ?BP (!) 137/97   Pulse 75   Temp 98 ?F (36.7 ?C) (Oral)   Resp 13   Ht '5\' 3"'$  (1.6 m)   Wt 98 kg   SpO2 100%   BMI 38.27 kg/m?  ?Physical Exam ?Vitals and nursing note reviewed.  ?Constitutional:   ?   General: She is not in acute distress. ?   Appearance: Normal appearance. She is well-developed.  ?HENT:  ?   Head: Normocephalic and atraumatic.  ?   Nose: Nose normal.  ?   Mouth/Throat:  ?   Mouth: Mucous membranes are moist.  ?Eyes:  ?   Extraocular Movements: Extraocular movements intact.  ?   Conjunctiva/sclera: Conjunctivae normal.  ?   Pupils: Pupils are equal, round, and reactive to light.  ?Cardiovascular:  ?   Rate and Rhythm: Normal rate and regular  rhythm.  ?   Pulses: Normal pulses.  ?   Heart sounds: Normal heart sounds. No murmur heard. ?Pulmonary:  ?   Effort: Pulmonary effort is normal. No respiratory distress.  ?   Breath sounds: Normal breath sounds.  ?Abdominal:  ?   General: Abdomen is flat.  ?   Palpations: Abdomen is soft.  ?   Tenderness: There is no abdominal tenderness.  ?Musculoskeletal:     ?   General: No swelling. Normal range of motion.  ?   Cervical back: Normal range of motion and neck supple.  ?Skin: ?   General: Skin is warm and dry.  ?   Capillary Refill: Capillary refill takes less than 2 seconds.  ?Neurological:  ?   General: No focal deficit present.  ?   Mental Status: She is alert and  oriented to person, place, and time.  ?   Cranial Nerves: No cranial nerve deficit.  ?   Sensory: No sensory deficit.  ?   Motor: No weakness.  ?   Coordination: Coordination normal.  ?Psychiatric:     ?   Mood and Affect: Mood normal.  ? ? ?ED Results / Procedures / Treatments   ?Labs ?(all labs ordered are listed, but only abnormal results are displayed) ?Labs Reviewed  ?COMPREHENSIVE METABOLIC PANEL - Abnormal; Notable for the following components:  ?    Result Value  ? Potassium 3.0 (*)   ? Creatinine, Ser 1.14 (*)   ? GFR, Estimated 57 (*)   ? All other components within normal limits  ?CBC WITH DIFFERENTIAL/PLATELET - Abnormal; Notable for the following components:  ? WBC 3.8 (*)   ? Hemoglobin 11.2 (*)   ? HCT 33.3 (*)   ? All other components within normal limits  ?LIPASE, BLOOD  ?URINALYSIS, ROUTINE W REFLEX MICROSCOPIC  ?CBG MONITORING, ED  ?I-STAT BETA HCG BLOOD, ED ([facility], [facility], [facility] ONLY)  ?TROPONIN I (HIGH SENSITIVITY)  ? ? ?EKG ?EKG Interpretation ? ?Date/Time:  Monday Jun 18 2021 13:28:34 EDT ?Ventricular Rate:  71 ?PR Interval:  126 ?QRS Duration: 94 ?QT Interval:  394 ?QTC Calculation: 428 ?R Axis:   -15 ?Text Interpretation: Normal sinus rhythm When compared with ECG of 09-Jul-2018 14:57, PREVIOUS ECG IS PRESENT Confirmed by Lennice Sites [phone]) on 06/18/2021 3:03:50 PM ? ?Radiology ?CT Head Wo Contrast ? ?Result Date: 06/18/2021 ?CLINICAL DATA:  Headache, new or worsening (Age >= 50y) headache EXAM: CT HEAD WITHOUT CONTRAST TECHNIQUE: Contiguous axial images were obtained from the base of the skull through the vertex without intravenous contrast. RADIATION DOSE REDUCTION: This exam was performed according to the departmental dose-optimization program which includes automated exposure control, adjustment of the mA and/or kV according to patient size and/or use of iterative reconstruction technique. COMPARISON:  None Available. FINDINGS: Brain: There is mild streak artifact related to patient's hair. No  intracranial hemorrhage, mass effect, or midline shift. No hydrocephalus. The basilar cisterns are patent. No evidence of territorial infarct or acute ischemia. Partially empty sella. No extra-axial or intracranial fluid collection. Vascular: No hyperdense vessel or unexpected calcification. Skull: No fracture or focal lesion. Sinuses/Orbits: Paranasal sinuses and mastoid air cells are clear. The visualized orbits are unremarkable. Other: None. IMPRESSION: 1. No acute intracranial abnormality. 2. Partially empty sella, typically incidental but can be seen in the setting of idiopathic intracranial hypertension. Electronically Signed   By: Keith Rake M.D.   On: 06/18/2021 16:07   ? ?Procedures ?Procedures  ? ? ?Medications Ordered in ED ?  Medications  ?sodium chloride 0.9 % bolus 1,000 mL (1,000 mLs Intravenous New Bag/Given 06/18/21 1621)  ?prochlorperazine (COMPAZINE) injection 10 mg (10 mg Intravenous Given 06/18/21 1620)  ?diphenhydrAMINE (BENADRYL) injection 12.5 mg (12.5 mg Intravenous Given 06/18/21 1620)  ? ? ?ED Course/ Medical Decision Making/ A&P ?  ?                        ?Medical Decision Making ?Amount and/or Complexity of Data Reviewed ?Labs: ordered. ?Radiology: ordered. ? ?Risk ?Prescription drug management. ? ? ?Anaika Santillano is here after near syncopal event.  History of hypertension, high cholesterol.  Felt migraine type headache today, this is usual for her.  Then she felt weak and lightheaded felt like she is in a pass out but did not.  She is feeling much better now still with a mild headache.  Has history of vasovagal syncope in the past per chart review.  Denies any chest pain or shortness of breath.  Very well-appearing.  Normal neurological exam.  Suspicion is that differential is likely vasovagal versus orthostatic process, seems less likely to be cardiogenic or neurogenic syncope.  We will get CBC, BMP, head CT, urinalysis, EKG, troponin.  Will give IV fluids and headache  cocktail. ? ?CT scan of the head per my review and interpretation shows no acute findings.  Urinalysis is negative.  Per my review and interpretation of labs there is no significant anemia, electrolyte abnormality, kidney injury,

## 2021-06-18 NOTE — ED Triage Notes (Signed)
Pt reports being at work and felt lightheaded. Pt sat down then her vision started to go out, became nauseous and had abd pain. States she passed out once a few years ago and had to wear a cardiac monitor. ?

## 2021-09-26 ENCOUNTER — Other Ambulatory Visit: Payer: Self-pay | Admitting: Emergency Medicine

## 2021-09-26 DIAGNOSIS — L309 Dermatitis, unspecified: Secondary | ICD-10-CM

## 2022-01-10 ENCOUNTER — Encounter: Payer: Self-pay | Admitting: Gastroenterology

## 2022-01-25 ENCOUNTER — Ambulatory Visit
Admission: RE | Admit: 2022-01-25 | Discharge: 2022-01-25 | Disposition: A | Discharge status: Home / Self Care | Payer: Managed Care, Other (non HMO) | Source: Ambulatory Visit | Attending: Obstetrics & Gynecology | Admitting: Obstetrics & Gynecology

## 2022-01-25 ENCOUNTER — Other Ambulatory Visit: Payer: Self-pay | Admitting: Obstetrics & Gynecology

## 2022-01-25 DIAGNOSIS — Z1231 Encounter for screening mammogram for malignant neoplasm of breast: Secondary | ICD-10-CM

## 2022-01-28 ENCOUNTER — Ambulatory Visit (HOSPITAL_BASED_OUTPATIENT_CLINIC_OR_DEPARTMENT_OTHER): Payer: Managed Care, Other (non HMO) | Admitting: Family

## 2022-01-28 ENCOUNTER — Encounter (HOSPITAL_BASED_OUTPATIENT_CLINIC_OR_DEPARTMENT_OTHER): Payer: Self-pay | Admitting: Family

## 2022-01-28 VITALS — BP 168/114 | HR 81 | Ht 63.0 in | Wt 223.3 lb

## 2022-01-28 DIAGNOSIS — E782 Mixed hyperlipidemia: Secondary | ICD-10-CM

## 2022-01-28 DIAGNOSIS — R072 Precordial pain: Secondary | ICD-10-CM | POA: Diagnosis not present

## 2022-01-28 DIAGNOSIS — I10 Essential (primary) hypertension: Secondary | ICD-10-CM | POA: Diagnosis not present

## 2022-01-28 DIAGNOSIS — Z6839 Body mass index (BMI) 39.0-39.9, adult: Secondary | ICD-10-CM

## 2022-01-28 MED ORDER — CARVEDILOL 12.5 MG PO TABS: 12.5000 mg | ORAL_TABLET | Freq: Two times a day (BID) | ORAL | 3 refills | Status: AC

## 2022-01-28 NOTE — Patient Instructions (Addendum)
Medication Instructions:  Your Physician recommend you continue on your current medication as directed.    *If you need a refill on your cardiac medications before your next appointment, please call your pharmacy*   Lab Work: Labs with [facility] next week, this will take care of your PreCT labs  If you have labs (blood work) drawn today and your tests are completely normal, you will receive your results only by: MyChart Message (if you have MyChart) OR A paper copy in the mail If you have any lab test that is abnormal or we need to change your treatment, we will call you to review the results.   Testing/Procedures:   Your cardiac CT will be scheduled at one of the below locations:   [facility] [address] [place], [place] [address] [phone]  If scheduled at [facility], please arrive at the [facility] and Children's Entrance (Entrance C2) of [facility] 30 minutes prior to test start time. You can use the FREE valet parking offered at entrance C (encouraged to control the heart rate for the test)  Proceed to the [facility] Radiology Department (first floor) to check-in and test prep.  All radiology patients and guests should use entrance C2 at [facility], accessed from [facility], even though the hospital's physical address listed is [address].   Please follow these instructions carefully (unless otherwise directed):  On the Night Before the Test: Be sure to Drink plenty of water. Do not consume any caffeinated/decaffeinated beverages or chocolate 12 hours prior to your test. Do not take any antihistamines 12 hours prior to your test.  On the Day of the Test: Drink plenty of water until 1 hour prior to the test. Do not eat any food 1 hour prior to test. You may take your regular medications prior to the test.  FEMALES- please wear underwire-free bra if available, avoid dresses & tight clothing       After the Test: Drink plenty of water. After receiving IV contrast, you may experience a mild flushed feeling. This is normal. On occasion, you may experience a mild rash up to 24 hours after the test. This is not dangerous. If this occurs, you can take Benadryl 25 mg and increase your fluid intake. If you experience trouble breathing, this can be serious. If it is severe call 911 IMMEDIATELY. If it is mild, please call our office. If you take any of these medications: Glipizide/Metformin, Avandament, Glucavance, please do not take 48 hours after completing test unless otherwise instructed.  We will call to schedule your test 2-4 weeks out understanding that some insurance companies will need an authorization prior to the service being performed.   For non-scheduling related questions, please contact the cardiac imaging nurse navigator should you have any questions/concerns: Marchia Bond, Cardiac Imaging Nurse Navigator Gordy Clement, Cardiac Imaging Nurse Navigator [facility] Direct Office Dial: [phone]   For scheduling needs, including cancellations and rescheduling, please call [place], [phone].    Follow-Up: At [facility], you and your health needs are our priority.  As part of our continuing mission to provide you with exceptional heart care, we have created designated Provider Care Teams.  These Care Teams include your primary Cardiologist (physician) and Advanced Practice Providers (APPs -  Physician Assistants and Nurse Practitioners) who all work together to provide you with the care you need, when you need it.  We recommend signing up  for the patient portal called "MyChart".  Sign up information is provided on this After Visit Summary.  MyChart is used to connect with patients for Virtual Visits (Telemedicine).  Patients are able to view lab/test results, encounter notes, upcoming appointments, etc.  Non-urgent messages can be sent  to your provider as well.   To learn more about what you can do with MyChart, go to [email or web address].    Your next appointment:   6 week(s)  The format for your next appointment:   In Person  Provider:   Freada Bergeron, MD  or Laurann Montana, NP    Other Instructions Daphene Jaeger will reach out via mychart on Friday to check your blood pressure to see what medication you may need before your Cardiac CT   Mental health is an important part of heart health. If you would like a referral to Dr. Elias Else call and let us know. He is located at [facility] Primary care at [facility].   [facility], [facility], and Janeann Merl are also good faith based licensed counselors if you are interested that do not require referral.

## 2022-01-28 NOTE — Progress Notes (Signed)
Office Visit    Patient Name: Belinda Petersen Date of Encounter: 01/28/2022  PCP:  Velna Hatchet, MD   [place]  Cardiologist:  Freada Bergeron, MD  Advanced Practice Provider:  No care team member to display Electrophysiologist:  None     Chief Complaint    Belinda Petersen is a 56 y.o. female  presents today for hypertension and assessment of cardiovascular risk  Past Medical History    Past Medical History:  Diagnosis Date   Allergy    COVID-19 virus infection 06/30/2018   has since had multiple negatibe tests    Fibroids    Headache(784.0)    HTN (hypertension)    no meds   Hyperlipidemia    Iron deficiency anemia secondary to blood loss (chronic)    Menorrhagia    SVD (spontaneous vaginal delivery)    x 1   Past Surgical History:  Procedure Laterality Date   DILITATION & CURRETTAGE/HYSTROSCOPY WITH VERSAPOINT RESECTION N/A 06/08/2012   Procedure: DILATATION & CURETTAGE/HYSTEROSCOPY WITH VERSAPOINT RESECTION;  Surgeon: Lyman Speller, MD;  Location: [facility];  Service: Gynecology;  Laterality: N/A;   WISDOM TOOTH EXTRACTION      Allergies  Allergies  Allergen Reactions   Hctz [Hydrochlorothiazide]     itching   Amlodipine Besylate Other (See Comments)    Headaches   Irbesartan Hives    Pt reports causes "hives."    History of Present Illness    Belinda Petersen is a 56 y.o. 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 last seen 10/02/2020  She called the office 09/29/2020 noting dizziness, mild nausea, equilibrium being off with certain position changes.  In clinic 10/02/2020 her symptoms were felt consistent with vertigo and she was recommended to start meclizine as needed.  Due to elevated blood pressure her propranolol was stopped and she was started on  carvedilol 12.5 mg twice daily.  Presents today for follow up.  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 Her husband is a Theme park manager. Her brother passed away about a year ago after complications related to cardiac surgery - tells me he had arrhythmia and died post procedure to fix a damaged artery and remove blockages due to undetected blood clot. She is checking BP intermittently at home 130s-140s/90s. Did not take her medication this weekend or today as she inadvertently left it on her desk at work. Reports no chest pain with activity but mild discomfort with stress. Mild exertional dyspnea which she attributes to deconditioning.No edema, orhtopnea, PND, palpitations.   EKGs/Labs/Other Studies Reviewed:   The following studies were reviewed today:  Echo 2018  - Left ventricle: The cavity size was normal. Wall thickness was   normal. Systolic function was normal. The estimated ejection   fraction was in the range of 55% to 60%. Wall motion was normal;   there were no regional wall motion abnormalities. Left   ventricular diastolic function parameters were normal.   Impressions:  - Normal LV systolic and diastolic function; no significant   valvular disease.     NST 2015: 1. No reversible ischemia or infarction.   2. Normal left ventricular wall motion.   3. Left ventricular ejection fraction 61%   4. Low-risk stress test findings with a fixed defect in the apical anterolateral wall consistent with breast  attenuation artifact. There was no ischemia. There was abnormal uptake of radiotracer in both forearms. Clniical correlation recommended.   *2012 Appropriate Use Criteria for Coronary Revascularization Focused Update: J Am Coll Cardiol. 3419;62(2):297-989. http://content.airportbarriers.com.aspx?articleid=1201161     Electronically Signed   By: Fransico Him   On: 10/14/2013 15:37  EKG:  EKG is ordered today.  The ekg ordered today demonstrates  NSR 79 bpm with no acute ST/T wave changes.   Recent Labs: 06/18/2021: ALT 15; BUN 13; Creatinine, Ser 1.14; Hemoglobin 11.2; Platelets 225; Potassium 3.0; Sodium 137  Recent Lipid Panel    Component Value Date/Time   CHOL 183 06/23/2019 0959   TRIG 95 06/23/2019 0959   HDL 59 06/23/2019 0959   CHOLHDL 3.1 06/23/2019 0959   LDLCALC 107 (H) 06/23/2019 0959   Home Medications   Current Meds  Medication Sig   albuterol (PROVENTIL HFA;VENTOLIN HFA) 108 (90 Base) MCG/ACT inhaler Inhale 2 puffs into the lungs every 4 (four) hours as needed for wheezing or shortness of breath.   Biotin 1 MG CAPS Take 1 mg by mouth daily.   chlorthalidone (HYGROTON) 25 MG tablet TAKE 1 TABLET BY MOUTH EVERY DAY IN THE MORNING WITH FOOD FOR 30 DAYS   cholecalciferol (VITAMIN D) 1000 units tablet Take 1,000 Units by mouth daily.   Cinnamon 500 MG capsule Take 500 mg by mouth daily.   Ferrous Sulfate (IRON) 325 (65 Fe) MG TABS Take 325 mg by mouth daily.   fluticasone (FLONASE) 50 MCG/ACT nasal spray Place 2 sprays into both nostrils daily.   meclizine (ANTIVERT) 12.5 MG tablet Take 1 tablet (12.5 mg total) by mouth 3 (three) times daily as needed for dizziness.   Multiple Vitamin (MULTIVITAMIN) tablet Take 1 tablet by mouth daily.   triamcinolone cream (KENALOG) 0.1 % APPLY TO AFFECTED AREA TWICE A DAY     Review of Systems      All other systems reviewed and are otherwise negative except as noted above.  Physical Exam    VS:  BP (!) 168/114   Pulse 81   Ht '5\' 3"'$  (1.6 m)   Wt 223 lb 4.8 oz (101.3 kg)   BMI 39.56 kg/m  , BMI Body mass index is 39.56 kg/m.  Wt Readings from Last 3 Encounters:  01/28/22 223 lb 4.8 oz (101.3 kg)  06/18/21 216 lb 0.8 oz (98 kg)  10/02/20 216 lb 4.8 oz (98.1 kg)     GEN: Well nourished, well developed, in no acute distress. HEENT: normal. Neck: Supple, no JVD, carotid bruits, or masses. Cardiac: RRR, no murmurs, rubs, or gallops. No clubbing, cyanosis, edema.   Radials/PT 2+ and equal bilaterally.  Respiratory:  Respirations regular and unlabored, clear to auscultation bilaterally. GI: Soft, nontender, nondistended. MS: No deformity or atrophy. Skin: Warm and dry, no rash. Neuro:  Strength and sensation are intact. Psych: Normal affect.  Assessment & Plan    Hypertension -BP not at goal but did not take medications today. She will monitor at home and report via MyChart in 1 week. Continue Carvedilol 12.'5mg'$  BID, Chlorthalidone '25mg'$  QD. If BP not at goal, consider Amlodipine vs increased dose of Carvedilol vs transition to Bystolic. She notes once daily meds may be easier.  Blood pressure goal less than 130/80.  Hesitant to use ACE/ARB due to previous hives.   HLD -not on statin at this time. Consider pending results of CTA as below.   Chest pain - Atypical as occurs with stress. Does note family history  of coronary artery disease and interested to know more about her cardiovascular risk. Plan for cardiac CTA. Will not collect BMP today as she has labs 01/07/22 with her PCP. Will decide pre procedure dose of beta blocker based on home BP checks which we will collect Friday via [place]. Hesitant to use HR reading from today as she has not taken her Carvedilol.   Syncope -previous episode in the setting of emesis felt to be vasovagal.  No recurrence.  Grief - Lost her brother within the last year. Discussed referral to psychology vs local centers that offer Christian-based counseling on her AVS. Encouraged to reach out to [place] if additional resources needed. She has good support system in family and her church.   Disposition: Follow up in 6 week(s) with Dr. Johney Frame or APP.  Signed, Loel Dubonnet, NP 01/28/2022, 1:10 PM [place]

## 2022-01-31 ENCOUNTER — Encounter (HOSPITAL_BASED_OUTPATIENT_CLINIC_OR_DEPARTMENT_OTHER): Payer: Self-pay

## 2022-01-31 ENCOUNTER — Telehealth: Payer: Self-pay

## 2022-01-31 NOTE — Telephone Encounter (Signed)
Called re: PREP program referral; left voicemail 

## 2022-02-12 ENCOUNTER — Telehealth (HOSPITAL_BASED_OUTPATIENT_CLINIC_OR_DEPARTMENT_OTHER): Payer: Self-pay | Admitting: Family

## 2022-02-12 NOTE — Telephone Encounter (Signed)
Patient had labs 02/06/22 at PCP. Faxed request for lab results to have BMP prior to scheduling CTA.   Loel Dubonnet, NP

## 2022-03-01 ENCOUNTER — Telehealth (HOSPITAL_COMMUNITY): Payer: Self-pay | Admitting: *Deleted

## 2022-03-01 ENCOUNTER — Encounter (HOSPITAL_COMMUNITY): Payer: Self-pay

## 2022-03-01 ENCOUNTER — Telehealth (HOSPITAL_BASED_OUTPATIENT_CLINIC_OR_DEPARTMENT_OTHER): Payer: Self-pay | Admitting: Family

## 2022-03-01 MED ORDER — METOPROLOL TARTRATE 50 MG PO TABS: ORAL_TABLET | ORAL | 0 refills | Status: DC

## 2022-03-01 NOTE — Telephone Encounter (Signed)
Called to request recent BP/HR readings. Left VM requesting call back.   She has cardiac CT upcoming. We had held off on prescribing beta blocker prior to CT as were awaiting BP/HR readings. As her scan is coming up next week, plan on the following:   Take Carvedilol that morning as usual.  Take Metoprolol Tartrate '50mg'$  two hours prior to CT. Rx sent to pharmacy.   Left detailed VM with these instructions.   Loel Dubonnet, NP

## 2022-03-01 NOTE — Telephone Encounter (Signed)
Attempted to call patient regarding upcoming cardiac CT appointment. °Left message on voicemail with name and callback number ° °Arleatha Philipps RN Navigator Cardiac Imaging °[facility] °[phone] Office °[phone] Cell ° °

## 2022-03-01 NOTE — Telephone Encounter (Signed)
Patient returning call regarding upcoming cardiac imaging study; pt verbalizes understanding of appt date/time, parking situation and where to check in, pre-test NPO status and medications ordered, and verified current allergies; name and call back number provided for further questions should they arise  Gordy Clement RN Navigator Cardiac Imaging [facility] [phone] office [phone] cell  Patient to take '50mg'$  metoprolol tartrate two hours prior to her cardiac CT scan. She is aware to arrive at 12PM.

## 2022-03-04 ENCOUNTER — Telehealth (HOSPITAL_BASED_OUTPATIENT_CLINIC_OR_DEPARTMENT_OTHER): Payer: Self-pay

## 2022-03-04 ENCOUNTER — Ambulatory Visit (HOSPITAL_COMMUNITY)
Admission: RE | Admit: 2022-03-04 | Discharge: 2022-03-04 | Disposition: A | Discharge status: Home / Self Care | Payer: Managed Care, Other (non HMO) | Source: Ambulatory Visit | Attending: Family | Admitting: Family

## 2022-03-04 DIAGNOSIS — E782 Mixed hyperlipidemia: Secondary | ICD-10-CM

## 2022-03-04 DIAGNOSIS — Z6839 Body mass index (BMI) 39.0-39.9, adult: Secondary | ICD-10-CM | POA: Diagnosis present

## 2022-03-04 DIAGNOSIS — R072 Precordial pain: Secondary | ICD-10-CM | POA: Diagnosis present

## 2022-03-04 DIAGNOSIS — I10 Essential (primary) hypertension: Secondary | ICD-10-CM | POA: Insufficient documentation

## 2022-03-04 MED ORDER — ASPIRIN 81 MG PO TBEC: 81.0000 mg | DELAYED_RELEASE_TABLET | Freq: Every day | ORAL | 3 refills | Status: AC

## 2022-03-04 MED ORDER — IOHEXOL 350 MG/ML SOLN
95.0000 mL | Freq: Once | INTRAVENOUS | Status: AC | PRN
  Administered 2022-03-04: 95 mL via INTRAVENOUS

## 2022-03-04 MED ORDER — NITROGLYCERIN 0.4 MG SL SUBL
0.8000 mg | SUBLINGUAL_TABLET | SUBLINGUAL | Status: DC | PRN
  Administered 2022-03-04: 0.8 mg via SUBLINGUAL

## 2022-03-04 MED ORDER — ROSUVASTATIN CALCIUM 20 MG PO TABS: 20.0000 mg | ORAL_TABLET | Freq: Every day | ORAL | 3 refills | Status: AC

## 2022-03-04 MED ORDER — NITROGLYCERIN 0.4 MG SL SUBL
SUBLINGUAL_TABLET | SUBLINGUAL | Status: AC
  Filled 2022-03-04: qty 2

## 2022-03-04 NOTE — Progress Notes (Deleted)
Cardiology Office Note:    Date:  03/04/2022   ID:  Belinda Petersen, DOB 10-27-1965, MRN [id number]  PCP:  Velna Hatchet, MD   [facility] Cardiologist:  Freada Bergeron, MD   Referring MD: Velna Hatchet, MD    History of Present Illness:    Belinda Petersen is a 57 y.o. female with a hx of HTN and HLD who presents to clinic for follow-up.  Per review of the record, the patient had normal stress test and TTE in 2015 that was obtained due to chest pain. Has history of syn  Past Medical History:  Diagnosis Date   Allergy    COVID-19 virus infection 06/30/2018   has since had multiple negatibe tests    Fibroids    Headache(784.0)    HTN (hypertension)    no meds   Hyperlipidemia    Iron deficiency anemia secondary to blood loss (chronic)    Menorrhagia    SVD (spontaneous vaginal delivery)    x 1    Past Surgical History:  Procedure Laterality Date   DILITATION & CURRETTAGE/HYSTROSCOPY WITH VERSAPOINT RESECTION N/A 06/08/2012   Procedure: DILATATION & CURETTAGE/HYSTEROSCOPY WITH VERSAPOINT RESECTION;  Surgeon: Lyman Speller, MD;  Location: [facility];  Service: Gynecology;  Laterality: N/A;   WISDOM TOOTH EXTRACTION      Current Medications: No outpatient medications have been marked as taking for the 03/12/22 encounter (Appointment) with Freada Bergeron, MD.     Allergies:   Hctz [hydrochlorothiazide], Amlodipine besylate, and Irbesartan   Social History   Socioeconomic History   Marital status: Married    Spouse name: Not on file   Number of children: 1   Years of education: Not on file   Highest education level: Not on file  Occupational History    Employer: Merchandiser, retail  Tobacco Use   Smoking status: Never   Smokeless tobacco: Never  Vaping Use   Vaping Use: Never used  Substance and Sexual Activity   Alcohol use: Yes    Alcohol/week: 0.0 standard drinks of alcohol    Comment: 1 glass twice a month    Drug use: No   Sexual activity: Yes    Partners: Male    Birth control/protection: None  Other Topics Concern   Not on file  Social History Narrative   Not on file   Social Determinants of Health   Financial Resource Strain: Not on file  Food Insecurity: Not on file  Transportation Needs: Not on file  Physical Activity: Not on file  Stress: Not on file  Social Connections: Not on file     Family History: The patient's ***family history includes Breast cancer in her paternal grandmother; Congestive Heart Failure in her mother; Diabetes in her father; Heart attack in her brother; Heart disease in her brother and maternal grandmother; Hypertension in her brother, brother, brother, father, maternal grandmother, mother, and sister; Lung cancer in her father and paternal grandfather. There is no history of Colon cancer, Colon polyps, Esophageal cancer, Rectal cancer, or Stomach cancer.  ROS:   Please see the history of present illness.    *** All other systems reviewed and are negative.  EKGs/Labs/Other Studies Reviewed:    The following studies were reviewed today: ***  EKG:  EKG is *** ordered today.  The ekg ordered today demonstrates ***  Recent Labs: 06/18/2021: ALT 15; BUN 13; Creatinine, Ser 1.14; Hemoglobin 11.2; Platelets 225; Potassium 3.0; Sodium 137  Recent Lipid Panel  Component Value Date/Time   CHOL 183 06/23/2019 0959   TRIG 95 06/23/2019 0959   HDL 59 06/23/2019 0959   CHOLHDL 3.1 06/23/2019 0959   LDLCALC 107 (H) 06/23/2019 0959     Risk Assessment/Calculations:   {Does this patient have ATRIAL FIBRILLATION?:[id number]}  No BP recorded.  {Refresh Note OR Click here to enter BP  :1}***         Physical Exam:    VS:  There were no vitals taken for this visit.    Wt Readings from Last 3 Encounters:  01/28/22 223 lb 4.8 oz (101.3 kg)  06/18/21 216 lb 0.8 oz (98 kg)  10/02/20 216 lb 4.8 oz (98.1 kg)     GEN: *** Well nourished, well developed in  no acute distress HEENT: Normal NECK: No JVD; No carotid bruits LYMPHATICS: No lymphadenopathy CARDIAC: ***RRR, no murmurs, rubs, gallops RESPIRATORY:  Clear to auscultation without rales, wheezing or rhonchi  ABDOMEN: Soft, non-tender, non-distended MUSCULOSKELETAL:  No edema; No deformity  SKIN: Warm and dry NEUROLOGIC:  Alert and oriented x 3 PSYCHIATRIC:  Normal affect   ASSESSMENT:    No diagnosis found. PLAN:    In order of problems listed above:  ***      {Are you ordering a CV Procedure (e.g. stress test, cath, DCCV, TEE, etc)?   Press F2        :[id number]    Medication Adjustments/Labs and Tests Ordered: Current medicines are reviewed at length with the patient today.  Concerns regarding medicines are outlined above.  No orders of the defined types were placed in this encounter.  No orders of the defined types were placed in this encounter.   There are no Patient Instructions on file for this visit.   Signed, Freada Bergeron, MD  03/04/2022 7:34 PM    [place]

## 2022-03-04 NOTE — Telephone Encounter (Addendum)
Results called to patient who verbalizes understanding! Labs ordered and mailed, prescriptions updated and sent to pharmacy on file.    ----- Message from Loel Dubonnet, NP sent at 03/04/2022  4:47 PM EST ----- Cardiac CTA with calcium score of 23.5. There is mild nonobstructive coronary artery disease. Not enough to cause symptoms. Would recommend secondary prevention. Add Aspirin '81mg'$  daily. Continue Carvedilol. 02/12/22 LDL 155 with goal of <70. Recommend addition of Rosuvastatin '20mg'$  daily. This will help to lower cholesterol as well to stabilize heart plaque and prevent progression. Repeat FLP/LFT in 2 months.

## 2022-03-12 ENCOUNTER — Ambulatory Visit: Payer: Managed Care, Other (non HMO) | Admitting: Cardiology

## 2022-03-18 ENCOUNTER — Encounter: Payer: Self-pay | Admitting: Cardiology

## 2022-03-18 ENCOUNTER — Ambulatory Visit: Payer: Managed Care, Other (non HMO) | Attending: Cardiology | Admitting: Cardiology

## 2022-03-18 VITALS — BP 134/92 | HR 88 | Ht 63.0 in | Wt 225.6 lb

## 2022-03-18 DIAGNOSIS — E782 Mixed hyperlipidemia: Secondary | ICD-10-CM | POA: Diagnosis not present

## 2022-03-18 DIAGNOSIS — Z79899 Other long term (current) drug therapy: Secondary | ICD-10-CM

## 2022-03-18 DIAGNOSIS — I10 Essential (primary) hypertension: Secondary | ICD-10-CM

## 2022-03-18 DIAGNOSIS — I2510 Atherosclerotic heart disease of native coronary artery without angina pectoris: Secondary | ICD-10-CM

## 2022-03-18 DIAGNOSIS — R072 Precordial pain: Secondary | ICD-10-CM

## 2022-03-18 NOTE — Patient Instructions (Signed)
Medication Instructions:   Your physician recommends that you continue on your current medications as directed. Please refer to the Current Medication list given to you today.  *If you need a refill on your cardiac medications before your next appointment, please call your pharmacy*   Lab Work:  [place] OFFICE--LIPIDS--PLEASE COME FASTING TO THIS LAB APPOINTMENT  If you have labs (blood work) drawn today and your tests are completely normal, you will receive your results only by: [place] (if you have MyChart) OR A paper copy in the mail If you have any lab test that is abnormal or we need to change your treatment, we will call you to review the results.   Follow-Up: At [facility], you and your health needs are our priority.  As part of our continuing mission to provide you with exceptional heart care, we have created designated Provider Care Teams.  These Care Teams include your primary Cardiologist (physician) and Advanced Practice Providers (APPs -  Physician Assistants and Nurse Practitioners) who all work together to provide you with the care you need, when you need it.  We recommend signing up for the patient portal called "MyChart".  Sign up information is provided on this After Visit Summary.  MyChart is used to connect with patients for Virtual Visits (Telemedicine).  Patients are able to view lab/test results, encounter notes, upcoming appointments, etc.  Non-urgent messages can be sent to your provider as well.   To learn more about what you can do with MyChart, go to [email or web address].    Your next appointment:   6 month(s)  Provider:   Freada Bergeron, MD

## 2022-03-18 NOTE — Progress Notes (Unsigned)
Cardiology Office Note:    Date:  03/19/2022   ID:  Belinda Petersen, DOB 06-Mar-1965, MRN [id number]  PCP:  Belinda Hatchet, MD   [facility] Cardiologist:  Freada Bergeron, MD   Referring MD: Belinda Hatchet, MD    History of Present Illness:    Belinda Petersen is a 57 y.o. female with a hx of HTN and HLD who presents to clinic for follow-up.  Per review of the record, the patient had normal stress test and TTE in 2015 that was obtained due to chest pain. Has history of syncope in the setting of emesis in 2018 thought to be vasovagal.   Was last seen in clinic by Laurann Montana, NP on 01/28/22 where she was having episodes of chest pain. Coronary CTA 02/2022 with mild nonobstructive disease. Ca score 23.5 (92%). Was continued on crestor '20mg'$  daily and ASA '81mg'$  daily was added.  Today, the patient states that she overall well. She is anxious about her family history and wants to discuss prevention. Continues to have intermittent chest discomfort more associated with anxiety and stress. No exertional symptoms. No SOB, orthopnea, PND, or palpitations. She is hoping to become more active after her shoulder injury improves. Adheres to a healthy diet.  Family history: Mother with heart failure. Brother with massive MI at 55. Other brother with arrhythmia and CAD and he passed away at 46.   Past Medical History:  Diagnosis Date   Allergy    COVID-19 virus infection 06/30/2018   has since had multiple negatibe tests    Fibroids    Headache(784.0)    HTN (hypertension)    no meds   Hyperlipidemia    Iron deficiency anemia secondary to blood loss (chronic)    Menorrhagia    SVD (spontaneous vaginal delivery)    x 1    Past Surgical History:  Procedure Laterality Date   DILITATION & CURRETTAGE/HYSTROSCOPY WITH VERSAPOINT RESECTION N/A 06/08/2012   Procedure: DILATATION & CURETTAGE/HYSTEROSCOPY WITH VERSAPOINT RESECTION;  Surgeon: Lyman Speller, MD;   Location: [facility];  Service: Gynecology;  Laterality: N/A;   WISDOM TOOTH EXTRACTION      Current Medications: Current Meds  Medication Sig   albuterol (PROVENTIL HFA;VENTOLIN HFA) 108 (90 Base) MCG/ACT inhaler Inhale 2 puffs into the lungs every 4 (four) hours as needed for wheezing or shortness of breath.   aspirin EC 81 MG tablet Take 1 tablet (81 mg total) by mouth daily. Swallow whole.   Biotin 1 MG CAPS Take 1 mg by mouth daily.   carvedilol (COREG) 12.5 MG tablet Take 1 tablet (12.5 mg total) by mouth 2 (two) times daily.   chlorthalidone (HYGROTON) 25 MG tablet TAKE 1 TABLET BY MOUTH EVERY DAY IN THE MORNING WITH FOOD FOR 30 DAYS   cholecalciferol (VITAMIN D) 1000 units tablet Take 1,000 Units by mouth daily.   Cinnamon 500 MG capsule Take 500 mg by mouth daily.   Ferrous Sulfate (IRON) 325 (65 Fe) MG TABS Take 325 mg by mouth daily.   fluticasone (FLONASE) 50 MCG/ACT nasal spray Place 2 sprays into both nostrils daily.   meclizine (ANTIVERT) 12.5 MG tablet Take 1 tablet (12.5 mg total) by mouth 3 (three) times daily as needed for dizziness.   Multiple Vitamin (MULTIVITAMIN) tablet Take 1 tablet by mouth daily.   rosuvastatin (CRESTOR) 20 MG tablet Take 1 tablet (20 mg total) by mouth daily.   triamcinolone cream (KENALOG) 0.1 % APPLY TO AFFECTED AREA TWICE A DAY   [  DISCONTINUED] metoprolol tartrate (LOPRESSOR) 50 MG tablet Take one tablet two hours prior to cardiac CT.     Allergies:   Hctz [hydrochlorothiazide], Amlodipine besylate, and Irbesartan   Social History   Socioeconomic History   Marital status: Married    Spouse name: Not on file   Number of children: 1   Years of education: Not on file   Highest education level: Not on file  Occupational History    Employer: Merchandiser, retail  Tobacco Use   Smoking status: Never   Smokeless tobacco: Never  Vaping Use   Vaping Use: Never used  Substance and Sexual Activity   Alcohol use: Yes    Alcohol/week: 0.0  standard drinks of alcohol    Comment: 1 glass twice a month   Drug use: No   Sexual activity: Yes    Partners: Male    Birth control/protection: None  Other Topics Concern   Not on file  Social History Narrative   Not on file   Social Determinants of Health   Financial Resource Strain: Not on file  Food Insecurity: Not on file  Transportation Needs: Not on file  Physical Activity: Not on file  Stress: Not on file  Social Connections: Not on file     Family History: The patient's family history includes Breast cancer in her paternal grandmother; Congestive Heart Failure in her mother; Diabetes in her father; Heart attack in her brother; Heart disease in her brother and maternal grandmother; Hypertension in her brother, brother, brother, father, maternal grandmother, mother, and sister; Lung cancer in her father and paternal grandfather. There is no history of Colon cancer, Colon polyps, Esophageal cancer, Rectal cancer, or Stomach cancer.  ROS:   Please see the history of present illness.     All other systems reviewed and are negative.  EKGs/Labs/Other Studies Reviewed:    The following studies were reviewed today: Echo 2018  - Left ventricle: The cavity size was normal. Wall thickness was   normal. Systolic function was normal. The estimated ejection   fraction was in the range of 55% to 60%. Wall motion was normal;   there were no regional wall motion abnormalities. Left   ventricular diastolic function parameters were normal.   Impressions:   - Normal LV systolic and diastolic function; no significant   valvular disease.     NST 2015: 1. No reversible ischemia or infarction.   2. Normal left ventricular wall motion.   3. Left ventricular ejection fraction 61%   4. Low-risk stress test findings with a fixed defect in the apical anterolateral wall consistent with breast attenuation artifact. There was no ischemia. There was abnormal uptake of radiotracer  in both forearms. Clniical correlation recommended.   *2012 Appropriate Use Criteria for Coronary Revascularization Focused Update: J Am Coll Cardiol. 9604;54(0):981-191. http://content.airportbarriers.com.aspx?articleid=1201161     Electronically Signed   By: Fransico Him   On: 10/14/2013 15:37  EKG:  EKG is not ordered today.   Recent Labs: 06/18/2021: ALT 15; BUN 13; Creatinine, Ser 1.14; Hemoglobin 11.2; Platelets 225; Potassium 3.0; Sodium 137  Recent Lipid Panel    Component Value Date/Time   CHOL 183 06/23/2019 0959   TRIG 95 06/23/2019 0959   HDL 59 06/23/2019 0959   CHOLHDL 3.1 06/23/2019 0959   LDLCALC 107 (H) 06/23/2019 0959     Risk Assessment/Calculations:            Physical Exam:    VS:  BP (!) 134/92   Pulse  88   Ht '5\' 3"'$  (1.6 m)   Wt 225 lb 9.6 oz (102.3 kg)   SpO2 99%   BMI 39.96 kg/m     Wt Readings from Last 3 Encounters:  03/19/22 223 lb (101.2 kg)  03/18/22 225 lb 9.6 oz (102.3 kg)  01/28/22 223 lb 4.8 oz (101.3 kg)     GEN:  Well nourished, well developed in no acute distress HEENT: Normal NECK: No JVD; No carotid bruits CARDIAC: RRR, no murmurs, rubs, gallops RESPIRATORY:  Clear to auscultation without rales, wheezing or rhonchi  ABDOMEN: Soft, non-tender, non-distended MUSCULOSKELETAL:  No edema; No deformity  SKIN: Warm and dry NEUROLOGIC:  Alert and oriented x 3 PSYCHIATRIC:  Normal affect   ASSESSMENT:    1. Coronary artery disease involving native coronary artery of native heart without angina pectoris   2. Mixed hyperlipidemia   3. Medication management   4. Essential hypertension   5. Precordial pain    PLAN:    In order of problems listed above:  #Mild Nonobstructive CAD: #Family History of CAD: Coronary CTA with mild disease. Ca score 23.5 (93%). Currently doing well. Will continue with medical therapy. -Continue ASA '81mg'$  daily -Continue crestor '20mg'$  daily  #HTN: Well controlled at home. Discussed goal  <130/90. -Continue coreg 12.'5mg'$  BID -Continue chlorthalidone '25mg'$  daily -Monitor at home  #HLD: -Continue crestor '20mg'$  daily (patient amenable to start taking it) -Check lipids in 8weeks -Goal LDL<70  #Atypical Chest Pain: Reassuring coronary CTA as above.  Exercise recommendations: Goal of exercising for at least 30 minutes a day, at least 5 times per week.  Please exercise to a moderate exertion.  This means that while exercising it is difficult to speak in full sentences, however you are not so short of breath that you feel you must stop, and not so comfortable that you can carry on a full conversation.  Exertion level should be approximately a 5/10, if 10 is the most exertion you can perform.  Diet recommendations: Recommend a heart healthy diet such as the Mediterranean diet.  This diet consists of plant based foods, healthy fats, lean meats, olive oil.  It suggests limiting the intake of simple carbohydrates such as white breads, pastries, and pastas.  It also limits the amount of red meat, wine, and dairy products such as cheese that one should consume on a daily basis.             Medication Adjustments/Labs and Tests Ordered: Current medicines are reviewed at length with the patient today.  Concerns regarding medicines are outlined above.  Orders Placed This Encounter  Procedures   Lipid Profile   No orders of the defined types were placed in this encounter.   Patient Instructions  Medication Instructions:   Your physician recommends that you continue on your current medications as directed. Please refer to the Current Medication list given to you today.  *If you need a refill on your cardiac medications before your next appointment, please call your pharmacy*   Lab Work:  [place] OFFICE--LIPIDS--PLEASE COME FASTING TO THIS LAB APPOINTMENT  If you have labs (blood work) drawn today and your tests are completely normal, you will receive your results  only by: [place] (if you have MyChart) OR A paper copy in the mail If you have any lab test that is abnormal or we need to change your treatment, we will call you to review the results.   Follow-Up: At [facility], you  and your health needs are our priority.  As part of our continuing mission to provide you with exceptional heart care, we have created designated Provider Care Teams.  These Care Teams include your primary Cardiologist (physician) and Advanced Practice Providers (APPs -  Physician Assistants and Nurse Practitioners) who all work together to provide you with the care you need, when you need it.  We recommend signing up for the patient portal called "MyChart".  Sign up information is provided on this After Visit Summary.  MyChart is used to connect with patients for Virtual Visits (Telemedicine).  Patients are able to view lab/test results, encounter notes, upcoming appointments, etc.  Non-urgent messages can be sent to your provider as well.   To learn more about what you can do with MyChart, go to [email or web address].    Your next appointment:   6 month(s)  Provider:   Freada Bergeron, MD         Signed, Freada Bergeron, MD  03/19/2022 11:08 AM    [place]

## 2022-03-19 ENCOUNTER — Other Ambulatory Visit (HOSPITAL_COMMUNITY)
Admission: RE | Admit: 2022-03-19 | Discharge: 2022-03-19 | Disposition: A | Discharge status: Home / Self Care | Payer: Managed Care, Other (non HMO) | Source: Ambulatory Visit | Attending: Obstetrics and Gynecology | Admitting: Obstetrics and Gynecology

## 2022-03-19 ENCOUNTER — Encounter: Payer: Self-pay | Admitting: Obstetrics and Gynecology

## 2022-03-19 ENCOUNTER — Ambulatory Visit (INDEPENDENT_AMBULATORY_CARE_PROVIDER_SITE_OTHER): Payer: Managed Care, Other (non HMO) | Admitting: Obstetrics and Gynecology

## 2022-03-19 VITALS — BP 124/72 | HR 72 | Ht 63.5 in | Wt 223.0 lb

## 2022-03-19 DIAGNOSIS — Z01419 Encounter for gynecological examination (general) (routine) without abnormal findings: Secondary | ICD-10-CM

## 2022-03-19 DIAGNOSIS — I2510 Atherosclerotic heart disease of native coronary artery without angina pectoris: Secondary | ICD-10-CM | POA: Insufficient documentation

## 2022-03-19 DIAGNOSIS — Z124 Encounter for screening for malignant neoplasm of cervix: Secondary | ICD-10-CM

## 2022-03-19 DIAGNOSIS — M25511 Pain in right shoulder: Secondary | ICD-10-CM | POA: Insufficient documentation

## 2022-03-19 NOTE — Progress Notes (Signed)
57 y.o. G68P0101 Married Black or [place] American Not Hispanic or Latino female here for annual exam.  PMP, no bleeding. She is having some night sweats, tolerable.  No hot flashes. No vaginal dryness, no dyspareunia.  No bowel or bladder issues.    She has a known fibroid uterus, 10-12 week sized at her last visit in 1/21.  She has noticed some darkening of the skin and occasional itching in her upper inner thighs.  Patient's last menstrual period was 08/27/2019.          Sexually active: Yes.    The current method of family planning is post menopausal status.    Exercising: Yes.     Doing steps and yoga.  Smoker:  no  Health Maintenance: Pap:  02/02/2018 WNL Hr HPV Neg  History of abnormal Pap:  no MMG:  01/29/22 density C Bi-rads 1 neg  BMD:   none  Colonoscopy: 02/16/2019 f/u 10 years per patient  TDaP:  unsure  Gardasil: n/a   reports that she has never smoked. She has never used smokeless tobacco. She reports current alcohol use. She reports that she does not use drugs. Husband is a Theme park manager. Son is getting married in May. 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   Past Medical History:  Diagnosis Date   Allergy    COVID-19 virus infection 06/30/2018   has since had multiple negatibe tests    Fibroids    Headache(784.0)    HTN (hypertension)    no meds   Hyperlipidemia    Iron deficiency anemia secondary to blood loss (chronic)    Menorrhagia    SVD (spontaneous vaginal delivery)    x 1    Past Surgical History:  Procedure Laterality Date   DILITATION & CURRETTAGE/HYSTROSCOPY WITH VERSAPOINT RESECTION N/A 06/08/2012   Procedure: DILATATION & CURETTAGE/HYSTEROSCOPY WITH VERSAPOINT RESECTION;  Surgeon: Lyman Speller, MD;  Location: [facility];  Service: Gynecology;  Laterality: N/A;   WISDOM TOOTH EXTRACTION      Current Outpatient Medications  Medication Sig Dispense Refill   albuterol (PROVENTIL HFA;VENTOLIN HFA) 108 (90 Base) MCG/ACT inhaler Inhale 2 puffs  into the lungs every 4 (four) hours as needed for wheezing or shortness of breath. 1 Inhaler 0   aspirin EC 81 MG tablet Take 1 tablet (81 mg total) by mouth daily. Swallow whole. 90 tablet 3   Biotin 1 MG CAPS Take 1 mg by mouth daily.     carvedilol (COREG) 12.5 MG tablet Take 1 tablet (12.5 mg total) by mouth 2 (two) times daily. 180 tablet 3   chlorthalidone (HYGROTON) 25 MG tablet TAKE 1 TABLET BY MOUTH EVERY DAY IN THE MORNING WITH FOOD FOR 30 DAYS 90 tablet 3   cholecalciferol (VITAMIN D) 1000 units tablet Take 1,000 Units by mouth daily.     Cinnamon 500 MG capsule Take 500 mg by mouth daily.     Ferrous Sulfate (IRON) 325 (65 Fe) MG TABS Take 325 mg by mouth daily.     fluticasone (FLONASE) 50 MCG/ACT nasal spray Place 2 sprays into both nostrils daily. 16 g 0   meclizine (ANTIVERT) 12.5 MG tablet Take 1 tablet (12.5 mg total) by mouth 3 (three) times daily as needed for dizziness. 30 tablet 0   Multiple Vitamin (MULTIVITAMIN) tablet Take 1 tablet by mouth daily.     rosuvastatin (CRESTOR) 20 MG tablet Take 1 tablet (20 mg total) by mouth daily. 90 tablet 3   triamcinolone cream (KENALOG)  0.1 % APPLY TO AFFECTED AREA TWICE A DAY 30 g 3   No current facility-administered medications for this visit.    Family History  Problem Relation Age of Onset   Diabetes Father    Hypertension Father    Lung cancer Father    Hypertension Mother    Congestive Heart Failure Mother    Hypertension Brother    Hypertension Brother    Hypertension Brother    Hypertension Sister    Heart disease Brother    Heart attack Brother    Breast cancer Paternal Grandmother    Heart disease Maternal Grandmother        pacemaker   Hypertension Maternal Grandmother    Lung cancer Paternal Grandfather    Colon cancer Neg Hx    Colon polyps Neg Hx    Esophageal cancer Neg Hx    Rectal cancer Neg Hx    Stomach cancer Neg Hx   She had 4 brother, 2 have passed from heart disease. She has one sister, HTN.    Review of Systems  All other systems reviewed and are negative.   Exam:   BP 124/72   Pulse 72   Ht 5' 3.5" (1.613 m)   Wt 223 lb (101.2 kg)   LMP 08/27/2019   SpO2 100%   BMI 38.88 kg/m   Weight change: '@WEIGHTCHANGE'$ @ Height:   Height: 5' 3.5" (161.3 cm)  Ht Readings from Last 3 Encounters:  03/19/22 5' 3.5" (1.613 m)  03/18/22 '5\' 3"'$  (1.6 m)  01/28/22 '5\' 3"'$  (1.6 m)    General appearance: alert, cooperative and appears stated age Head: Normocephalic, without obvious abnormality, atraumatic Neck: no adenopathy, supple, symmetrical, trachea midline and thyroid normal to inspection and palpation Lungs: clear to auscultation bilaterally Cardiovascular: regular rate and rhythm Breasts: normal appearance, no masses or tenderness Abdomen: soft, non-tender; non distended,  no masses,  no organomegaly Extremities: extremities normal, atraumatic, no cyanosis or edema Skin: Skin color, texture, turgor normal. No rashes or lesions. Her upper inner thighs currently look normal. No current irritation noted.  Lymph nodes: Cervical, supraclavicular, and axillary nodes normal. No abnormal inguinal nodes palpated Neurologic: Grossly normal   Pelvic: External genitalia:  no lesions              Urethra:  normal appearing urethra with no masses, tenderness or lesions              Bartholins and Skenes: normal                 Vagina: normal appearing vagina with normal color and discharge, no lesions              Cervix: no lesions               Bimanual Exam:  Uterus:   not appreciably enlarged, not tender              Adnexa: no mass, fullness, tenderness               Rectovaginal: Confirms               Anus:  normal sphincter tone, no lesions  Santiago Glad, CMA chaperoned for the exam.  1. Well woman exam Discussed breast self exam Discussed calcium and vit D intake Labs with primary Mammogram and colonoscopy are UTD Discussed skin care and avoiding irritation and chaffing.   2.  Screening for cervical cancer - Cytology - PAP

## 2022-03-19 NOTE — Patient Instructions (Signed)

## 2022-03-20 LAB — CYTOLOGY - PAP
Comment: NEGATIVE
Diagnosis: NEGATIVE
High risk HPV: NEGATIVE

## 2022-03-22 ENCOUNTER — Ambulatory Visit: Payer: Managed Care, Other (non HMO)

## 2022-05-13 ENCOUNTER — Ambulatory Visit: Payer: Managed Care, Other (non HMO) | Attending: Cardiology

## 2022-06-03 ENCOUNTER — Telehealth (HOSPITAL_BASED_OUTPATIENT_CLINIC_OR_DEPARTMENT_OTHER): Payer: Self-pay | Admitting: Family

## 2022-06-03 NOTE — Telephone Encounter (Signed)
Plaque report received and reviewed.  Calcified plaque score of 0 with noncalcified plaque score of 4.  Did you add a medication? Yes.    ?If yes, how many? Number of meds added: 2 (Rosuvastatin  QD, Aspirin EC  daily)  ?If no, reason? N/A  Did you remove a medication? No.  Did you increase the dosage of any medication? No.  Did you decrease the dosage of any medication? No.  Did you refer to a specialist (i.e. lipid clinic, preventive cardiology, endocrinology)? No.  Has patient seen plaque report? No.  Alver Sorrow, NP

## 2022-06-11 ENCOUNTER — Ambulatory Visit: Payer: Managed Care, Other (non HMO) | Admitting: Cardiology

## 2022-09-05 ENCOUNTER — Encounter: Payer: Self-pay | Admitting: Cardiology

## 2022-09-18 ENCOUNTER — Ambulatory Visit: Payer: Managed Care, Other (non HMO) | Admitting: Cardiology

## 2022-12-16 ENCOUNTER — Ambulatory Visit (HOSPITAL_COMMUNITY)
Admission: EM | Admit: 2022-12-16 | Discharge: 2022-12-16 | Disposition: A | Discharge status: Home / Self Care | Payer: Managed Care, Other (non HMO) | Attending: Internal Medicine | Admitting: Internal Medicine

## 2022-12-16 ENCOUNTER — Encounter (HOSPITAL_COMMUNITY): Payer: Self-pay

## 2022-12-16 DIAGNOSIS — M778 Other enthesopathies, not elsewhere classified: Secondary | ICD-10-CM

## 2022-12-16 MED ORDER — NAPROXEN 375 MG PO TABS: 375.0000 mg | ORAL_TABLET | Freq: Two times a day (BID) | ORAL | 0 refills | Status: AC

## 2022-12-16 NOTE — ED Triage Notes (Signed)
Pt presents to office for R-thumb pain that started last night. Pt denies any recent injury to her thumb.

## 2022-12-16 NOTE — ED Provider Notes (Signed)
[facility]    CSN: [id number] Arrival date & time: 12/16/22  3664      History   Chief Complaint Chief Complaint  Patient presents with   Finger Injury    HPI Belinda Petersen is a 57 y.o. female to urgent care with right thumb pain which started fairly suddenly this morning.  Patient denies any trauma to the right thumb.  Movement of the right thumb is associated with clicking of the finger extends like a clasp knife on extension out the finger.  Mild swelling of the right thumb.  No erythema.  Pain is mild to moderate in severity with no known relieving factors.  No numbness or tingling.  No radiation of the pain.   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/2012   Essential hypertension, benign 04/29/2012    Past Surgical History:  Procedure Laterality Date   DILITATION & CURRETTAGE/HYSTROSCOPY WITH VERSAPOINT RESECTION N/A 06/08/2012   Procedure: DILATATION & CURETTAGE/HYSTEROSCOPY WITH VERSAPOINT RESECTION;  Surgeon: Annamaria Boots, MD;  Location: [facility];  Service: Gynecology;  Laterality: N/A;   WISDOM TOOTH EXTRACTION      OB History     Gravida  1   Para  1   Term  0   Preterm  1   AB  0   Living  1      SAB  0   IAB  0   Ectopic  0   Multiple  0   Live Births  1            Home Medications    Prior to  Admission medications   Medication Sig Start Date End Date Taking? Authorizing Provider  albuterol (PROVENTIL HFA;VENTOLIN HFA) 108 (90 Base) MCG/ACT inhaler Inhale 2 puffs into the lungs every 4 (four) hours as needed for wheezing or shortness of breath. 09/04/17  Yes Domenick Gong, MD  aspirin EC 81 MG tablet Take 1 tablet (81 mg total) by mouth daily. Swallow whole. 03/04/22  Yes Alver Sorrow, NP  Biotin 1 MG CAPS Take 1 mg by mouth daily.   Yes [provider]  carvedilol (COREG) 12.5 MG tablet Take 1 tablet (12.5 mg total) by mouth 2 (two) times daily. 01/28/22 01/23/23 Yes Alver Sorrow, NP  cholecalciferol (VITAMIN D) 1000 units tablet Take 1,000 Units by mouth daily.   Yes [provider]  Cinnamon 500 MG capsule Take 500 mg by mouth daily.   Yes [provider]  Multiple Vitamin (MULTIVITAMIN) tablet Take 1 tablet by mouth daily.   Yes [provider]  naproxen (NAPROSYN) 375 MG tablet Take 1 tablet (375 mg total) by mouth 2 (two) times daily. 12/16/22  Yes Safia Panzer, Britta Mccreedy, MD  rosuvastatin (CRESTOR) 20 MG tablet Take 1 tablet (  20 mg total) by mouth daily. 03/04/22 02/27/23 Yes Alver Sorrow, NP  chlorthalidone (HYGROTON) 25 MG tablet TAKE 1 TABLET BY MOUTH EVERY DAY IN THE MORNING WITH FOOD FOR 30 DAYS 12/20/20   Alver Sorrow, NP  Ferrous Sulfate (IRON) 325 (65 Fe) MG TABS Take 325 mg by mouth daily.    [provider]  fluticasone (FLONASE) 50 MCG/ACT nasal spray Place 2 sprays into both nostrils daily. 09/04/17   Domenick Gong, MD  meclizine (ANTIVERT) 12.5 MG tablet Take 1 tablet (12.5 mg total) by mouth 3 (three) times daily as needed for dizziness. 10/02/20   Alver Sorrow, NP  triamcinolone cream (KENALOG) 0.1 % APPLY TO AFFECTED AREA TWICE A DAY 09/26/21   Georgina Quint, MD    Family History Family History  Problem Relation Age of Onset   Diabetes Father    Hypertension Father    Lung cancer Father     Hypertension Mother    Congestive Heart Failure Mother    Hypertension Brother    Hypertension Brother    Hypertension Brother    Hypertension Sister    Heart disease Brother    Heart attack Brother    Breast cancer Paternal Grandmother    Heart disease Maternal Grandmother        pacemaker   Hypertension Maternal Grandmother    Lung cancer Paternal Grandfather    Colon cancer Neg Hx    Colon polyps Neg Hx    Esophageal cancer Neg Hx    Rectal cancer Neg Hx    Stomach cancer Neg Hx     Social History Social History   Tobacco Use   Smoking status: Never   Smokeless tobacco: Never  Vaping Use   Vaping status: Never Used  Substance Use Topics   Alcohol use: Yes    Alcohol/week: 0.0 standard drinks of alcohol    Comment: 1 glass twice a month   Drug use: No     Allergies   Hctz [hydrochlorothiazide], Amlodipine besylate, and Irbesartan   Review of Systems Review of Systems As per HPI  Physical Exam Triage Vital Signs ED Triage Vitals [12/16/22 0820]  Encounter Vitals Group     BP 136/88     Systolic BP Percentile      Diastolic BP Percentile      Pulse Rate 71     Resp 18     Temp 97.9 F (36.6 C)     Temp Source Oral     SpO2 93 %     Weight      Height      Head Circumference      Peak Flow      Pain Score 5     Pain Loc      Pain Education      Exclude from Growth Chart    No data found.  Updated Vital Signs BP 136/88 (BP Location: Left Arm)   Pulse 71   Temp 97.9 F (36.6 C) (Oral)   Resp 18   LMP 08/27/2019   SpO2 93% Comment: nails  Visual Acuity Right Eye Distance:   Left Eye Distance:   Bilateral Distance:    Right Eye Near:   Left Eye Near:    Bilateral Near:     Physical Exam Vitals and nursing note reviewed.  Constitutional:      General: She is not in acute distress.    Appearance: She is not ill-appearing.  Musculoskeletal:  General: Swelling and tenderness present. Normal range of motion.     Comments:  Tenderness on the palmar surface of the right thumb.  The right thumb extends like a clasp knife.  Neurological:     Mental Status: She is alert.      UC Treatments / Results  Labs (all labs ordered are listed, but only abnormal results are displayed) Labs Reviewed - No data to display  EKG   Radiology No results found.  Procedures Procedures (including critical care time)  Medications Ordered in UC Medications - No data to display  Initial Impression / Assessment and Plan / UC Course  I have reviewed the triage vital signs and the nursing notes.  Pertinent labs & imaging results that were available during my care of the patient were reviewed by me and considered in my medical decision making (see chart for details).     1.  Tendinitis of the right thumb: Right hand brace Naproxen 375 mg twice daily as needed for pain Icing of the right hand Gentle range of motion exercises Return precautions given No indication for x-rays at this time. Final Clinical Impressions(s) / UC Diagnoses   Final diagnoses:  Tendinitis of finger of right hand     Discharge Instructions      Please apply the wrist splint at bedtime. Rest the affected painful area. Apply cold compresses intermittently as needed. Please take medications as directed.  Please make sure you take naproxen with food As pain recedes, begin normal activities slowly as tolerated. Return to urgent care if symptoms persist.    ED Prescriptions     Medication Sig Dispense Auth. Provider   naproxen (NAPROSYN) 375 MG tablet Take 1 tablet (375 mg total) by mouth 2 (two) times daily. 20 tablet Elide Stalzer, Britta Mccreedy, MD      PDMP not reviewed this encounter.   Merrilee Jansky, MD 12/16/22 [phone]

## 2022-12-16 NOTE — Discharge Instructions (Addendum)
Please apply the wrist splint at bedtime. Rest the affected painful area. Apply cold compresses intermittently as needed. Please take medications as directed.  Please make sure you take naproxen with food As pain recedes, begin normal activities slowly as tolerated. Return to urgent care if symptoms persist.

## 2023-04-15 ENCOUNTER — Other Ambulatory Visit: Payer: Self-pay | Admitting: Internal Medicine

## 2023-04-15 DIAGNOSIS — Z1231 Encounter for screening mammogram for malignant neoplasm of breast: Secondary | ICD-10-CM

## 2023-05-05 ENCOUNTER — Ambulatory Visit
Admission: RE | Admit: 2023-05-05 | Discharge: 2023-05-05 | Disposition: A | Discharge status: Home / Self Care | Payer: Self-pay | Source: Ambulatory Visit | Attending: Internal Medicine | Admitting: Internal Medicine

## 2023-05-05 DIAGNOSIS — Z1231 Encounter for screening mammogram for malignant neoplasm of breast: Secondary | ICD-10-CM

## 2023-05-07 ENCOUNTER — Ambulatory Visit: Payer: Self-pay

## 2023-05-08 ENCOUNTER — Other Ambulatory Visit: Payer: Self-pay | Admitting: Internal Medicine

## 2023-05-08 DIAGNOSIS — R928 Other abnormal and inconclusive findings on diagnostic imaging of breast: Secondary | ICD-10-CM

## 2023-05-26 ENCOUNTER — Encounter

## 2023-06-04 ENCOUNTER — Other Ambulatory Visit: Payer: Self-pay | Admitting: Internal Medicine

## 2023-06-04 ENCOUNTER — Ambulatory Visit
Admission: RE | Admit: 2023-06-04 | Discharge: 2023-06-04 | Disposition: A | Discharge status: Home / Self Care | Source: Ambulatory Visit | Attending: Internal Medicine | Admitting: Internal Medicine

## 2023-06-04 DIAGNOSIS — R921 Mammographic calcification found on diagnostic imaging of breast: Secondary | ICD-10-CM

## 2023-06-04 DIAGNOSIS — R928 Other abnormal and inconclusive findings on diagnostic imaging of breast: Secondary | ICD-10-CM

## 2023-06-06 ENCOUNTER — Encounter

## 2023-06-11 ENCOUNTER — Encounter

## 2024-03-17 ENCOUNTER — Encounter: Payer: Self-pay | Admitting: Internal Medicine
